# Patient Record
Sex: Female | Born: 1945 | Hispanic: Yes | Smoking: Former smoker
Health system: Southern US, Community
[De-identification: ages and names within clinical notes are randomized; demographics above are authoritative.]

## PROBLEM LIST (undated history)

## (undated) DIAGNOSIS — M1990 Unspecified osteoarthritis, unspecified site: Secondary | ICD-10-CM

## (undated) DIAGNOSIS — R7303 Prediabetes: Secondary | ICD-10-CM

## (undated) DIAGNOSIS — E079 Disorder of thyroid, unspecified: Secondary | ICD-10-CM

## (undated) DIAGNOSIS — R251 Tremor, unspecified: Secondary | ICD-10-CM

## (undated) DIAGNOSIS — Z86718 Personal history of other venous thrombosis and embolism: Secondary | ICD-10-CM

## (undated) DIAGNOSIS — F32A Depression, unspecified: Secondary | ICD-10-CM

## (undated) DIAGNOSIS — K219 Gastro-esophageal reflux disease without esophagitis: Secondary | ICD-10-CM

## (undated) DIAGNOSIS — I10 Essential (primary) hypertension: Secondary | ICD-10-CM

## (undated) DIAGNOSIS — Z923 Personal history of irradiation: Secondary | ICD-10-CM

## (undated) DIAGNOSIS — G4730 Sleep apnea, unspecified: Secondary | ICD-10-CM

## (undated) DIAGNOSIS — F329 Major depressive disorder, single episode, unspecified: Secondary | ICD-10-CM

## (undated) DIAGNOSIS — S329XXA Fracture of unspecified parts of lumbosacral spine and pelvis, initial encounter for closed fracture: Secondary | ICD-10-CM

## (undated) DIAGNOSIS — K5790 Diverticulosis of intestine, part unspecified, without perforation or abscess without bleeding: Secondary | ICD-10-CM

## (undated) DIAGNOSIS — E785 Hyperlipidemia, unspecified: Secondary | ICD-10-CM

## (undated) DIAGNOSIS — F419 Anxiety disorder, unspecified: Secondary | ICD-10-CM

## (undated) DIAGNOSIS — C801 Malignant (primary) neoplasm, unspecified: Secondary | ICD-10-CM

## (undated) HISTORY — PX: TUBAL LIGATION: SHX77

## (undated) HISTORY — DX: Fracture of unspecified parts of lumbosacral spine and pelvis, initial encounter for closed fracture: S32.9XXA

## (undated) HISTORY — DX: Hyperlipidemia, unspecified: E78.5

## (undated) HISTORY — DX: Personal history of other venous thrombosis and embolism: Z86.718

## (undated) HISTORY — DX: Disorder of thyroid, unspecified: E07.9

## (undated) HISTORY — PX: APPENDECTOMY: SHX54

## (undated) HISTORY — DX: Essential (primary) hypertension: I10

## (undated) HISTORY — PX: TONSILLECTOMY: SUR1361

## (undated) HISTORY — PX: OOPHORECTOMY: SHX86

## (undated) HISTORY — PX: HERNIA REPAIR: SHX51

---

## 2004-09-01 ENCOUNTER — Ambulatory Visit: Payer: Self-pay | Admitting: Family Medicine

## 2005-10-04 ENCOUNTER — Ambulatory Visit: Payer: Self-pay | Admitting: Chiropractic Medicine

## 2006-01-13 ENCOUNTER — Ambulatory Visit: Payer: Self-pay | Admitting: Family Medicine

## 2007-02-20 ENCOUNTER — Ambulatory Visit: Payer: Self-pay | Admitting: Family Medicine

## 2007-06-01 ENCOUNTER — Ambulatory Visit: Payer: Self-pay | Admitting: Family Medicine

## 2008-08-26 ENCOUNTER — Ambulatory Visit: Payer: Self-pay | Admitting: Family Medicine

## 2008-10-18 DIAGNOSIS — Z86718 Personal history of other venous thrombosis and embolism: Secondary | ICD-10-CM

## 2008-10-18 HISTORY — DX: Personal history of other venous thrombosis and embolism: Z86.718

## 2009-10-30 ENCOUNTER — Ambulatory Visit: Payer: Self-pay | Admitting: Family Medicine

## 2010-01-26 ENCOUNTER — Ambulatory Visit: Payer: Self-pay | Admitting: Family Medicine

## 2010-12-22 ENCOUNTER — Ambulatory Visit: Payer: Self-pay | Admitting: Family Medicine

## 2011-01-04 ENCOUNTER — Ambulatory Visit: Payer: Self-pay | Admitting: Gastroenterology

## 2012-03-08 ENCOUNTER — Ambulatory Visit: Payer: Self-pay | Admitting: Family Medicine

## 2013-02-14 ENCOUNTER — Ambulatory Visit: Payer: Self-pay | Admitting: Nurse Practitioner

## 2013-03-05 ENCOUNTER — Ambulatory Visit: Payer: Self-pay | Admitting: Family Medicine

## 2013-03-27 ENCOUNTER — Encounter: Payer: Self-pay | Admitting: Pulmonary Disease

## 2013-03-27 ENCOUNTER — Ambulatory Visit (INDEPENDENT_AMBULATORY_CARE_PROVIDER_SITE_OTHER): Payer: Medicare Other | Admitting: Pulmonary Disease

## 2013-03-27 VITALS — BP 120/76 | HR 69 | Temp 98.4°F | Ht 62.0 in | Wt 162.0 lb

## 2013-03-27 DIAGNOSIS — R05 Cough: Secondary | ICD-10-CM

## 2013-03-27 DIAGNOSIS — R9389 Abnormal findings on diagnostic imaging of other specified body structures: Secondary | ICD-10-CM | POA: Insufficient documentation

## 2013-03-27 NOTE — Assessment & Plan Note (Signed)
The chest CT performed in May 2014 showed tree in bud abnormalities in the lateral segment of the right lower lobe. The differential diagnosis includes bacterial pneumonia, aspiration, granulomatous processes such as sarcoid, or less likely an atypical mycobacterial disease. I think the likely etiology is aspiration given the fact that she has had recurrent problems with swallowing and occasionally choking in the last several months. She has been treated with antibiotics and hopefully this will clear up the abnormality.  Plan: -Repeat CT scan in July 2014 -If no improvement and CT scan abnormalities then she will need a bronchoscopy -Consider modified barium swallow if cough/CT scan findings have not improved or if complaints of choking his ongoing.

## 2013-03-27 NOTE — Assessment & Plan Note (Signed)
Mary Meyers's cough has been persistent now for nearly 6 months.  The most likely explanation is recurrent upper respiratory infections and perhaps a mild pneumonia which developed in May as noted on the CT scan (see below).  Usually in situations like this cough causes more cough through and in low cycle of upper airway irritation. She says that her cough has improved which makes me encouraged that there is not a more ominous underlying pulmonary process.  Her simple spirometry test today was completely normal an not suggestive of airflow obstruction (asthma/copd).  Plan: -We will focus heavily on voice rest an active cough suppression -Tussionex used regularly for 3 days to help suppress cough -Use hard candies or warm beverages to help moisten the throat to help prevent cough -After 3 days of regular Tussionex used transitioned to Delsym -Consider barium swallow/swallow evaluation if no improvement

## 2013-03-27 NOTE — Patient Instructions (Signed)
Take the tussionex 5mL every 12 hours as needed for the cough  I want you to have three days of voice rest where you try your best to stop coughing.  Avoid talking during this time.  During this time take the tussionex regularly to help suppress the cough.  Use hard candies like jolly ranchers or lemon drops and hot tea to help keep your throat moist.   After three days you can start using Delsym as needed to help suppress the cough and gradually start using your voice again.   We will repeat the CT scan of your chest in July at [facility] and then will see you sometime after the test.

## 2013-03-27 NOTE — Progress Notes (Signed)
Subjective:    Patient ID: Mary Meyers, female    DOB: 12/30/1945, 67 y.o.   MRN: [id number]  HPI  This is a very pleasant 67 year old female originally from [place] who comes her clinic today to establish care for recurrent cough. She stated that as a child and throughout adulthood she's never had respiratory problems. However in November she developed an episode of bronchitis when she was in [place]. (She lives in [place] half the year). She was treated there with antibiotics and then came back up to [place]. She's had recurrent bronchitis again through March. She describes a heavy cough which is sometimes productive of mucus in the mornings and at night. This is often associated with headaches and dizziness. In the last several weeks she has seen some improvement and she is no longer producing mucus. She notes that she still feels that she has chest congestion. Lately however, the cough has been dry and hacking. It gets worse with coughing and talking. She feels a tickling sensation in her throat. She states that sometimes the cough has been so bad that she starts to feel lightheaded. In fact one night she got up in the middle the night because she was coughing frequently and she believes she may have blacked out because she fell and hit her head. She was actually evaluated in the emergency department for this where she had a head CT and a chest CT. The chest CT was negative for pulmonary embolism.   She has minimal sinus congestion or drainage. She has no acid reflux.   She believes she's been treated with antibiotics 2 or 3 times in the last several months. Most recently, her CT chest showed tree in bud abnormalities in the right lower lobe and she was given a course of Levaquin afterwards. After this she started seeing some improvement in her cough. She feels that she has coughed nearly continuously since November 2013.   She smoked briefly as a young adult prior to her first pregnancy but  has not smoked for the majority of adulthood. Her husband smokes in an isolated part of the house to which she very infrequently goes. He does not smoke around her and does not smoke in the car with her. She lives in a house that was dull just one year ago. It has no basement and no mold or mildew issues. She is frequently exposed to her grandchildren who frequently have upper respiratory symptoms but they have not had anything lately.   Past Medical History  Diagnosis Date  . Hyperlipidemia   . Hypertension   . History of blood clots      History reviewed. No pertinent family history.   History   Social History  . Marital Status: Married    Spouse Name: N/A    Number of Children: N/A  . Years of Education: N/A   Occupational History  . Not on file.   Social History Main Topics  . Smoking status: Former Smoker    Types: Cigarettes    Quit date: 10/19/1971  . Smokeless tobacco: Never Used  . Alcohol Use: No  . Drug Use: No  . Sexually Active: Not on file   Other Topics Concern  . Not on file   Social History Narrative  . No narrative on file     Allergies  Allergen Reactions  . Ivp Dye (Iodinated Diagnostic Agents)      No outpatient prescriptions prior to visit.   No facility-administered medications prior to visit.  Review of Systems  Constitutional: Negative for fever, chills, diaphoresis, appetite change and fatigue.  HENT: Negative for hearing loss, nosebleeds, congestion, sore throat, rhinorrhea, trouble swallowing, neck stiffness, postnasal drip and sinus pressure.   Eyes: Negative for discharge, redness and visual disturbance.  Respiratory: Positive for cough and shortness of breath. Negative for choking, chest tightness and wheezing.   Cardiovascular: Negative for chest pain and leg swelling.  Gastrointestinal: Negative for nausea, abdominal pain, diarrhea, constipation and blood in stool.  Genitourinary: Negative for dysuria, frequency and  hematuria.  Musculoskeletal: Negative for myalgias, joint swelling and arthralgias.  Skin: Negative for color change, pallor and rash.  Neurological: Negative for dizziness, seizures, facial asymmetry, speech difficulty, light-headedness, numbness and headaches.  Hematological: Negative for adenopathy. Does not bruise/bleed easily.       Objective:   Physical Exam  Filed Vitals:   03/27/13 1628  BP: 120/76  Pulse: 69  Temp: 98.4 F (36.9 C)  TempSrc: Oral  Height: 5\' 2"  (1.575 m)  Weight: 162 lb (73.483 kg)  SpO2: 96%  RA  Gen: well appearing, no acute distress, frequent cought HEENT: NCAT, PERRL, EOMi, OP clear, neck supple without masses PULM: CTA B CV: RRR, no mgr, no JVD AB: BS+, soft, nontender, no hsm Ext: warm, no edema, no clubbing, no cyanosis Derm: no rash or skin breakdown Neuro: A&Ox4, CN II-XII intact, strength 5/5 in all 4 extremities      Assessment & Plan:   Cough Caree's cough has been persistent now for nearly 6 months.  The most likely explanation is recurrent upper respiratory infections and perhaps a mild pneumonia which developed in May as noted on the CT scan (see below).  Usually in situations like this cough causes more cough through and in low cycle of upper airway irritation. She says that her cough has improved which makes me encouraged that there is not a more ominous underlying pulmonary process.  Her simple spirometry test today was completely normal an not suggestive of airflow obstruction (asthma/copd).  Plan: -We will focus heavily on voice rest an active cough suppression -Tussionex used regularly for 3 days to help suppress cough -Use hard candies or warm beverages to help moisten the throat to help prevent cough -After 3 days of regular Tussionex used transitioned to Delsym -Consider barium swallow/swallow evaluation if no improvement  Abnormal chest CT The chest CT performed in May 2014 showed tree in bud abnormalities in the  lateral segment of the right lower lobe. The differential diagnosis includes bacterial pneumonia, aspiration, granulomatous processes such as sarcoid, or less likely an atypical mycobacterial disease. I think the likely etiology is aspiration given the fact that she has had recurrent problems with swallowing and occasionally choking in the last several months. She has been treated with antibiotics and hopefully this will clear up the abnormality.  Plan: -Repeat CT scan in July 2014 -If no improvement and CT scan abnormalities then she will need a bronchoscopy -Consider modified barium swallow if cough/CT scan findings have not improved or if complaints of choking his ongoing.   Updated Medication List Outpatient Encounter Prescriptions as of 03/27/2013  Medication Sig Dispense Refill  . celecoxib (CELEBREX) 200 MG capsule Take 200 mg by mouth daily.      . cetirizine (ZYRTEC) 10 MG tablet Take 10 mg by mouth daily as needed for allergies.      Marland Kitchen dexlansoprazole (DEXILANT) 60 MG capsule Take 60 mg by mouth daily.      Marland Kitchen levothyroxine (SYNTHROID,  LEVOTHROID) 100 MCG tablet Take 100 mcg by mouth daily before breakfast.      . meclizine (ANTIVERT) 25 MG tablet Take 25 mg by mouth 3 (three) times daily as needed.      . simvastatin (ZOCOR) 20 MG tablet Take 20 mg by mouth every evening.      . triamterene-hydrochlorothiazide (MAXZIDE) 75-50 MG per tablet Take 0.5 tablets by mouth daily.      Marland Kitchen venlafaxine XR (EFFEXOR-XR) 150 MG 24 hr capsule Take 150 mg by mouth daily.       No facility-administered encounter medications on file as of 03/27/2013.

## 2013-04-23 ENCOUNTER — Telehealth: Payer: Self-pay | Admitting: Pulmonary Disease

## 2013-04-23 NOTE — Telephone Encounter (Signed)
Spoke with pt and she is aware that ct is rescheduled for 04-26-13 at 11:30 at [facility].  Pt verbalized understanding.

## 2013-04-26 ENCOUNTER — Ambulatory Visit: Payer: Self-pay | Admitting: Pulmonary Disease

## 2013-04-30 ENCOUNTER — Telehealth: Payer: Self-pay | Admitting: Pulmonary Disease

## 2013-04-30 DIAGNOSIS — R9389 Abnormal findings on diagnostic imaging of other specified body structures: Secondary | ICD-10-CM

## 2013-04-30 NOTE — Telephone Encounter (Signed)
I called Mary Meyers to tell her that her CT Chest looked great (nearly complete resolution of previous findings, only mild atelectasis).  However, I had to leave a message.  Will cc triage in case she calls back.

## 2013-05-29 ENCOUNTER — Encounter: Payer: Self-pay | Admitting: Pulmonary Disease

## 2014-12-02 ENCOUNTER — Ambulatory Visit: Payer: Self-pay | Admitting: Adult Health

## 2015-09-22 DIAGNOSIS — E039 Hypothyroidism, unspecified: Secondary | ICD-10-CM | POA: Insufficient documentation

## 2015-09-22 DIAGNOSIS — E785 Hyperlipidemia, unspecified: Secondary | ICD-10-CM | POA: Insufficient documentation

## 2016-05-17 DIAGNOSIS — M1990 Unspecified osteoarthritis, unspecified site: Secondary | ICD-10-CM | POA: Insufficient documentation

## 2016-05-17 DIAGNOSIS — F32A Depression, unspecified: Secondary | ICD-10-CM | POA: Insufficient documentation

## 2016-05-17 DIAGNOSIS — K219 Gastro-esophageal reflux disease without esophagitis: Secondary | ICD-10-CM | POA: Insufficient documentation

## 2016-10-29 DIAGNOSIS — R251 Tremor, unspecified: Secondary | ICD-10-CM | POA: Insufficient documentation

## 2017-02-07 DIAGNOSIS — M1710 Unilateral primary osteoarthritis, unspecified knee: Secondary | ICD-10-CM | POA: Insufficient documentation

## 2017-02-07 DIAGNOSIS — M179 Osteoarthritis of knee, unspecified: Secondary | ICD-10-CM | POA: Insufficient documentation

## 2017-02-17 ENCOUNTER — Other Ambulatory Visit: Payer: Self-pay | Admitting: Student

## 2017-02-17 DIAGNOSIS — N950 Postmenopausal bleeding: Secondary | ICD-10-CM

## 2017-02-21 ENCOUNTER — Ambulatory Visit
Admission: RE | Admit: 2017-02-21 | Discharge: 2017-02-21 | Disposition: A | Discharge status: Home / Self Care | Payer: Medicare Other | Source: Ambulatory Visit | Attending: Student | Admitting: Student

## 2017-02-21 DIAGNOSIS — N950 Postmenopausal bleeding: Secondary | ICD-10-CM | POA: Diagnosis present

## 2017-06-01 ENCOUNTER — Other Ambulatory Visit: Payer: Medicare Other

## 2017-07-26 ENCOUNTER — Other Ambulatory Visit: Payer: Medicare Other

## 2017-08-02 ENCOUNTER — Inpatient Hospital Stay: Admission: RE | Admit: 2017-08-02 | Payer: Medicare Other | Source: Ambulatory Visit

## 2017-08-02 NOTE — H&P (Signed)
Mary Meyers is a 72 y.o. female here for Fx D+C and possible myosure resection . Here for SIS and f/up for PMB . EMBX shows benign endometrial polyp . No cancer , hyperplasia   SIS today with inadequate filling of the endometrial cavity .  Stripe measures 12 mm  Past Medical History:  has a past medical history of Depression, unspecified; Diverticulitis; GERD (gastroesophageal reflux disease); and Hypertension.  Past Surgical History:  has a past surgical history that includes Appendectomy. Family History: family history includes Breast cancer in her cousin; Prostate cancer in her brother and father. Social History:  reports that she has never smoked. She has never used smokeless tobacco. She reports that she does not drink alcohol or use drugs. OB/GYN History:          OB History    Gravida Para Term Preterm AB Living   3 3 3     3    SAB TAB Ectopic Molar Multiple Live Births             3      Allergies: is allergic to cortisone; iodinated contrast- oral and iv dye; and tramadol. Medications:  Current Outpatient Prescriptions:  .  atorvastatin (LIPITOR) 40 MG tablet, TAKE 1 TABLET BY MOUTH EVERY DAY, Disp: 90 tablet, Rfl: 1 .  celecoxib (CELEBREX) 200 MG capsule, TAKE 1 CAPSULE(200 MG) BY MOUTH EVERY DAY, Disp: 90 capsule, Rfl: 0 .  cetirizine (ZYRTEC) 10 MG tablet, TAKE 1 TABLET(10 MG) BY MOUTH EVERY DAY AS NEEDED FOR ALLERGIES, Disp: 90 tablet, Rfl: 0 .  DEXILANT 60 mg DR capsule, TAKE 1 CAPSULE(60 MG) BY MOUTH EVERY DAY, Disp: 90 capsule, Rfl: 0 .  levothyroxine (SYNTHROID, LEVOTHROID) 100 MCG tablet, TAKE 1 TABLET(100 MCG) BY MOUTH EVERY DAY 30 TO 60 MINUTES BEFORE BREAKFAST ON AN EMPTY STOMACH AND WITH A GLASS OF WATER, Disp: 90 tablet, Rfl: 0 .  levothyroxine (SYNTHROID, LEVOTHROID) 75 MCG tablet, TAKE 1 TABLET(75 MCG) BY MOUTH EVERY DAY 30 TO 60 MINUTES BEFORE BREAKFAST ON AN EMPTY STOMACH AND WITH A GLASS OF WATER, Disp: 90 tablet, Rfl: 0 .  meclizine (ANTIVERT) 25 mg  tablet, TAKE 1 TABLET BY MOUTH EVERY 6 HOURS AS NEEDED, Disp: 20 tablet, Rfl: 0 .  pantoprazole (PROTONIX) 40 MG DR tablet, TAKE 1 TABLET(40 MG) BY MOUTH EVERY DAY, Disp: 30 tablet, Rfl: 0 .  triamterene-hydrochlorothiazide (MAXZIDE) 75-50 mg tablet, TAKE 1 TABLET BY MOUTH EVERY DAY, Disp: 90 tablet, Rfl: 0 .  venlafaxine (EFFEXOR-XR) 150 MG XR capsule, TAKE 1 CAPSULE BY MOUTH EVERY DAY, Disp: 30 capsule, Rfl: 0  Review of Systems: General:                      No fatigue or weight loss Eyes:                           No vision changes Ears:                            No hearing difficulty Respiratory:                No cough or shortness of breath Pulmonary:                  No asthma or shortness of breath Cardiovascular:           No chest pain, palpitations, dyspnea on  exertion Gastrointestinal:          No abdominal bloating, chronic diarrhea, constipations, masses, pain or hematochezia Genitourinary:             No hematuria, dysuria, abnormal vaginal discharge, pelvic pain, Menometrorrhagia Lymphatic:                   No swollen lymph nodes Musculoskeletal:         No muscle weakness Neurologic:                  No extremity weakness, syncope, seizure disorder Psychiatric:                  No history of depression, delusions or suicidal/homicidal ideation    Exam:      Vitals:   08/02/17  1448  BP: (!) 159/93  Pulse: 80    Body mass index is 37.49 kg/m.  WDWN  female in NAD   Lungs: CTA  CV : RRR without murmur   Abdomen: soft , no mass, normal active bowel sounds,  non-tender, no rebound tenderness Pelvic: tanner stage 5 ,  External genitalia: vulva /labia no lesions Urethra: no prolapse Vagina: normal physiologic d/c Cervix: no lesions, no cervical motion tenderness   Uterus: normal size shape and contour, non-tender Adnexa: no mass,  non-tender   Saline infusion sonohysterography: betadine prep to the cervix followed by placement of the HSG catheter into  the endometrial canal . Sterile H2O is injected while performing a transvaginal u/s . Findings:inadequate filling , no definitive mass  Impression:   The primary encounter diagnosis was PMB (postmenopausal bleeding). A diagnosis of Increased endometrial stripe thickness was also pertinent to this visit.  Endometrial polyps are most likely the cause   Plan:   Recommend Fx D+C , possible myosure  In OR  Risks of the procedure discussed with  the pt      Caroline Sauger, MD

## 2017-08-08 ENCOUNTER — Ambulatory Visit
Admission: RE | Admit: 2017-08-08 | Payer: Medicare Other | Source: Ambulatory Visit | Admitting: Obstetrics and Gynecology

## 2017-08-08 ENCOUNTER — Encounter: Admission: RE | Payer: Self-pay | Source: Ambulatory Visit

## 2017-08-08 SURGERY — DILATATION AND CURETTAGE /HYSTEROSCOPY
Anesthesia: Choice

## 2017-09-17 DIAGNOSIS — C801 Malignant (primary) neoplasm, unspecified: Secondary | ICD-10-CM

## 2017-09-17 HISTORY — DX: Malignant (primary) neoplasm, unspecified: C80.1

## 2017-09-20 NOTE — H&P (Signed)
Mary Meyers is a 71 y.o. female here for Fx D+C and  H/S , possible myosure  . Here for SIS and f/up for PMB . EMBX shows benign endometrial polyp . No cancer , hyperplasia   SIS today with inadequate filling of the endometrial cavity .  Stripe measures 12 mm  Past Medical History:  has a past medical history of Depression, unspecified; Diverticulitis; GERD (gastroesophageal reflux disease); and Hypertension.  Past Surgical History:  has a past surgical history that includes Appendectomy. Family History: family history includes Breast cancer in her cousin; Prostate cancer in her brother and father. Social History:  reports that she has never smoked. She has never used smokeless tobacco. She reports that she does not drink alcohol or use drugs. OB/GYN History:          OB History    Gravida Para Term Preterm AB Living   3 3 3     3    SAB TAB Ectopic Molar Multiple Live Births             3      Allergies: is allergic to cortisone; iodinated contrast- oral and iv dye; and tramadol. Medications:  Current Outpatient Prescriptions:  .  atorvastatin (LIPITOR) 40 MG tablet, TAKE 1 TABLET BY MOUTH EVERY DAY, Disp: 90 tablet, Rfl: 1 .  celecoxib (CELEBREX) 200 MG capsule, TAKE 1 CAPSULE(200 MG) BY MOUTH EVERY DAY, Disp: 90 capsule, Rfl: 0 .  cetirizine (ZYRTEC) 10 MG tablet, TAKE 1 TABLET(10 MG) BY MOUTH EVERY DAY AS NEEDED FOR ALLERGIES, Disp: 90 tablet, Rfl: 0 .  DEXILANT 60 mg DR capsule, TAKE 1 CAPSULE(60 MG) BY MOUTH EVERY DAY, Disp: 90 capsule, Rfl: 0 .  levothyroxine (SYNTHROID, LEVOTHROID) 100 MCG tablet, TAKE 1 TABLET(100 MCG) BY MOUTH EVERY DAY 30 TO 60 MINUTES BEFORE BREAKFAST ON AN EMPTY STOMACH AND WITH A GLASS OF WATER, Disp: 90 tablet, Rfl: 0 .  levothyroxine (SYNTHROID, LEVOTHROID) 75 MCG tablet, TAKE 1 TABLET(75 MCG) BY MOUTH EVERY DAY 30 TO 60 MINUTES BEFORE BREAKFAST ON AN EMPTY STOMACH AND WITH A GLASS OF WATER, Disp: 90 tablet, Rfl: 0 .  meclizine (ANTIVERT) 25 mg  tablet, TAKE 1 TABLET BY MOUTH EVERY 6 HOURS AS NEEDED, Disp: 20 tablet, Rfl: 0 .  pantoprazole (PROTONIX) 40 MG DR tablet, TAKE 1 TABLET(40 MG) BY MOUTH EVERY DAY, Disp: 30 tablet, Rfl: 0 .  triamterene-hydrochlorothiazide (MAXZIDE) 75-50 mg tablet, TAKE 1 TABLET BY MOUTH EVERY DAY, Disp: 90 tablet, Rfl: 0 .  venlafaxine (EFFEXOR-XR) 150 MG XR capsule, TAKE 1 CAPSULE BY MOUTH EVERY DAY, Disp: 30 capsule, Rfl: 0  Review of Systems: General:                      No fatigue or weight loss Eyes:                           No vision changes Ears:                            No hearing difficulty Respiratory:                No cough or shortness of breath Pulmonary:                  No asthma or shortness of breath Cardiovascular:           No chest pain,  palpitations, dyspnea on exertion Gastrointestinal:          No abdominal bloating, chronic diarrhea, constipations, masses, pain or hematochezia Genitourinary:             No hematuria, dysuria, abnormal vaginal discharge, pelvic pain, Menometrorrhagia Lymphatic:                   No swollen lymph nodes Musculoskeletal:         No muscle weakness Neurologic:                  No extremity weakness, syncope, seizure disorder Psychiatric:                  No history of depression, delusions or suicidal/homicidal ideation    Exam:      Vitals:   09/20/17  BP: (!) 159/93  Pulse: 80    Body mass index is 37.49 kg/m.  WDWN white/ female in NAD   Lungs: CTA  CV : RRR without murmur   Abdomen: soft , no mass, normal active bowel sounds,  non-tender, no rebound tenderness Pelvic: tanner stage 5 ,  External genitalia: vulva /labia no lesions Urethra: no prolapse Vagina: normal physiologic d/c Cervix: no lesions, no cervical motion tenderness   Uterus: normal size shape and contour, non-tender Adnexa: no mass,  non-tender   Saline infusion sonohysterography: betadine prep to the cervix followed by placement of the HSG catheter into  the endometrial canal . Sterile H2O is injected while performing a transvaginal u/s . Findings:inadequate filling , no definitive mass  Impression:   The primary encounter diagnosis was PMB (postmenopausal bleeding). A diagnosis of Increased endometrial stripe thickness was also pertinent to this visit.  Endometrial polyps are most likely the cause   Plan:    Fx D+C , possible myosure  In OR  Risks of the procedure discussed with  the pt    Return if symptoms worsen or fail to improve, for preop.  Caroline Sauger, MD                         Note shared with patient     Department

## 2017-09-26 ENCOUNTER — Inpatient Hospital Stay: Admission: RE | Admit: 2017-09-26 | Payer: Medicare Other | Source: Ambulatory Visit

## 2017-09-28 ENCOUNTER — Other Ambulatory Visit: Payer: Self-pay

## 2017-09-28 ENCOUNTER — Encounter
Admission: RE | Admit: 2017-09-28 | Discharge: 2017-09-28 | Disposition: A | Discharge status: Home / Self Care | Payer: Medicare Other | Source: Ambulatory Visit | Attending: Obstetrics and Gynecology | Admitting: Obstetrics and Gynecology

## 2017-09-28 ENCOUNTER — Encounter: Payer: Self-pay | Admitting: *Deleted

## 2017-09-28 DIAGNOSIS — Z01818 Encounter for other preprocedural examination: Secondary | ICD-10-CM | POA: Diagnosis present

## 2017-09-28 DIAGNOSIS — N950 Postmenopausal bleeding: Secondary | ICD-10-CM | POA: Insufficient documentation

## 2017-09-28 DIAGNOSIS — R9389 Abnormal findings on diagnostic imaging of other specified body structures: Secondary | ICD-10-CM | POA: Insufficient documentation

## 2017-09-28 HISTORY — DX: Gastro-esophageal reflux disease without esophagitis: K21.9

## 2017-09-28 HISTORY — DX: Tremor, unspecified: R25.1

## 2017-09-28 HISTORY — DX: Major depressive disorder, single episode, unspecified: F32.9

## 2017-09-28 HISTORY — DX: Depression, unspecified: F32.A

## 2017-09-28 HISTORY — DX: Unspecified osteoarthritis, unspecified site: M19.90

## 2017-09-28 HISTORY — DX: Diverticulosis of intestine, part unspecified, without perforation or abscess without bleeding: K57.90

## 2017-09-28 HISTORY — DX: Anxiety disorder, unspecified: F41.9

## 2017-09-28 LAB — BASIC METABOLIC PANEL
Anion gap: 12 (ref 5–15)
BUN: 23 mg/dL — ABNORMAL HIGH (ref 6–20)
CHLORIDE: 101 mmol/L (ref 101–111)
CO2: 26 mmol/L (ref 22–32)
CREATININE: 0.75 mg/dL (ref 0.44–1.00)
Calcium: 9.9 mg/dL (ref 8.9–10.3)
GFR calc Af Amer: >60 mL/min (ref >60)
GFR calc non Af Amer: >60 mL/min (ref >60)
GLUCOSE: 98 mg/dL (ref 65–99)
POTASSIUM: 3.4 mmol/L — AB (ref 3.5–5.1)
SODIUM: 139 mmol/L (ref 135–145)

## 2017-09-28 LAB — CBC
HEMATOCRIT: 42.3 % (ref 35.0–47.0)
Hemoglobin: 14.5 g/dL (ref 12.0–16.0)
MCH: 30.7 pg (ref 26.0–34.0)
MCHC: 34.2 g/dL (ref 32.0–36.0)
MCV: 89.9 fL (ref 80.0–100.0)
PLATELETS: 413 10*3/uL (ref 150–440)
RBC: 4.71 MIL/uL (ref 3.80–5.20)
RDW: 14.1 % (ref 11.5–14.5)
WBC: 8.5 10*3/uL (ref 3.6–11.0)

## 2017-09-28 LAB — TYPE AND SCREEN
ABO/RH(D): B POS
Antibody Screen: NEGATIVE

## 2017-09-28 NOTE — Patient Instructions (Signed)
Your procedure is scheduled on: October 03, 2017 (Monday ) Report to Same Day Surgery.(MEDICAL [facility] ) SECOND FLOOR To find out your arrival time please call [phone] between [place] on DECEMBER .14, 2018 (Friday )  Remember: Instructions that are not followed completely may result in serious medical risk, up to and including death, or upon the discretion of your surgeon and anesthesiologist your surgery may need to be rescheduled.     _X__ 1. Do not eat food after midnight the night before your procedure.                 No gum chewing or hard candies. You may drink clear liquids up to 2 hours                 before you are scheduled to arrive for your surgery- DO not drink clear                 liquids within 2 hours of the start of your surgery.                 Clear Liquids include:  water, apple juice without pulp, clear carbohydrate                 drink such as Clearfast of Gartorade, Black Coffee or Tea (Do not add                 anything to coffee or tea).     _X__ 2.  No Alcohol for 24 hours before or after surgery.   _X__ 3.  Do Not Smoke or use e-cigarettes For 24 Hours Prior to Your Surgery.                 Do not use any chewable tobacco products for at least 6 hours prior to                 surgery.  ____  4.  Bring all medications with you on the day of surgery if instructed.   _X__  5.  Notify your doctor if there is any change in your medical condition      (cold, fever, infections).     Do not wear jewelry, make-up, hairpins, clips or nail polish. Do not wear lotions, powders, or perfumes.  Do not shave 48 hours prior to surgery. Men may shave face and neck. Do not bring valuables to the hospital.    [facility] is not responsible for any belongings or valuables.  Contacts, dentures or bridgework may not be worn into surgery. Leave your suitcase in the car. After surgery it may be brought to your room. For patients admitted to the  hospital, discharge time is determined by your treatment team.   Patients discharged the day of surgery will not be allowed to drive home.   Please read over the following fact sheets that you were given:             __X__ Take these medicines the morning of surgery with A SIP OF WATER:    1. ATORVASTATIN  2. LEVOTHYROXINE  3. PANTOPRAZOLE  4. PANTOPRAZOLE AT BEDTIME ON SUNDAY NIGHT , DECEMBER 16  5. VENLAFAXINE  6.  ____ Fleet Enema (as directed)   ____ Use CHG Soap as directed  ____ Use inhalers on the day of surgery  ____ Stop metformin 2 days prior to surgery    ____ Take 1/2 of usual insulin dose the night before  surgery. No insulin the morning          of surgery.   ___X_ Stop Coumadin/Plavix/aspirin on (NO ASPIRIN )  __X__ Stop Anti-inflammatories on (NO ASPIRIN PRODUCTS SUCH AS MOTRIN, ADVIL , ALEVE, IBUPROFEN, BC, GOODY'S, EXCEDRIN ) TYLENOL OK TO TAKE FOR PAIN IF NEEDED   ____ Stop supplements until after surgery.    ____ Bring C-Pap to the hospital.   INCENTIVE SPIROMETRY GIVEN TO PATIENT AND INSTRUCTED TO [place]

## 2017-09-28 NOTE — Pre-Procedure Instructions (Signed)
  Component Name Value Ref Range  Vent Rate (bpm) 58   PR Interval (msec) 140   QRS Interval (msec) 84   QT Interval (msec) 440   QTc (msec) 431   Other Result Information  This result has an attachment that is not available.  Result Narrative  Sinus bradycardia with premature atrial complexes Nonspecific ST abnormality Abnormal ECG No previous ECGs available I reviewed and concur with this report. Electronically signed VO:PFYT MD, KEN [phone]) on 08/17/2017 1:02:48 PM  Status

## 2017-10-02 MED ORDER — CEFOXITIN SODIUM-DEXTROSE 2-2.2 GM-%(50ML) IV SOLR: 2.0000 g | INTRAVENOUS | Status: DC

## 2017-10-03 ENCOUNTER — Encounter: Payer: Self-pay | Admitting: Obstetrics and Gynecology

## 2017-10-03 ENCOUNTER — Other Ambulatory Visit: Payer: Self-pay

## 2017-10-03 ENCOUNTER — Ambulatory Visit: Payer: Medicare Other | Admitting: Certified Registered Nurse Anesthetist

## 2017-10-03 ENCOUNTER — Observation Stay
Admission: RE | Admit: 2017-10-03 | Discharge: 2017-10-04 | Disposition: A | Discharge status: Home / Self Care | Payer: Medicare Other | Source: Ambulatory Visit | Attending: Internal Medicine | Admitting: Internal Medicine

## 2017-10-03 ENCOUNTER — Encounter
Admission: RE | Disposition: A | Discharge status: Home / Self Care | Payer: Self-pay | Source: Ambulatory Visit | Attending: Internal Medicine

## 2017-10-03 DIAGNOSIS — C541 Malignant neoplasm of endometrium: Secondary | ICD-10-CM | POA: Diagnosis not present

## 2017-10-03 DIAGNOSIS — F329 Major depressive disorder, single episode, unspecified: Secondary | ICD-10-CM | POA: Insufficient documentation

## 2017-10-03 DIAGNOSIS — E669 Obesity, unspecified: Secondary | ICD-10-CM | POA: Diagnosis not present

## 2017-10-03 DIAGNOSIS — K219 Gastro-esophageal reflux disease without esophagitis: Secondary | ICD-10-CM | POA: Insufficient documentation

## 2017-10-03 DIAGNOSIS — R001 Bradycardia, unspecified: Secondary | ICD-10-CM | POA: Diagnosis present

## 2017-10-03 DIAGNOSIS — I10 Essential (primary) hypertension: Secondary | ICD-10-CM | POA: Diagnosis not present

## 2017-10-03 DIAGNOSIS — Z87891 Personal history of nicotine dependence: Secondary | ICD-10-CM | POA: Insufficient documentation

## 2017-10-03 DIAGNOSIS — T41205A Adverse effect of unspecified general anesthetics, initial encounter: Secondary | ICD-10-CM | POA: Diagnosis not present

## 2017-10-03 DIAGNOSIS — Z86711 Personal history of pulmonary embolism: Secondary | ICD-10-CM | POA: Diagnosis not present

## 2017-10-03 DIAGNOSIS — E039 Hypothyroidism, unspecified: Secondary | ICD-10-CM | POA: Diagnosis not present

## 2017-10-03 DIAGNOSIS — Z888 Allergy status to other drugs, medicaments and biological substances status: Secondary | ICD-10-CM | POA: Insufficient documentation

## 2017-10-03 DIAGNOSIS — I9581 Postprocedural hypotension: Secondary | ICD-10-CM | POA: Diagnosis not present

## 2017-10-03 DIAGNOSIS — Z79899 Other long term (current) drug therapy: Secondary | ICD-10-CM | POA: Insufficient documentation

## 2017-10-03 DIAGNOSIS — Z6837 Body mass index (BMI) 37.0-37.9, adult: Secondary | ICD-10-CM | POA: Insufficient documentation

## 2017-10-03 DIAGNOSIS — N840 Polyp of corpus uteri: Secondary | ICD-10-CM | POA: Diagnosis not present

## 2017-10-03 DIAGNOSIS — M1990 Unspecified osteoarthritis, unspecified site: Secondary | ICD-10-CM | POA: Diagnosis not present

## 2017-10-03 DIAGNOSIS — N950 Postmenopausal bleeding: Secondary | ICD-10-CM | POA: Diagnosis present

## 2017-10-03 DIAGNOSIS — F419 Anxiety disorder, unspecified: Secondary | ICD-10-CM | POA: Diagnosis not present

## 2017-10-03 DIAGNOSIS — E785 Hyperlipidemia, unspecified: Secondary | ICD-10-CM | POA: Diagnosis not present

## 2017-10-03 DIAGNOSIS — Y92238 Other place in hospital as the place of occurrence of the external cause: Secondary | ICD-10-CM | POA: Insufficient documentation

## 2017-10-03 HISTORY — PX: HYSTEROSCOPY WITH D & C: SHX1775

## 2017-10-03 HISTORY — PX: DILATATION & CURETTAGE/HYSTEROSCOPY WITH MYOSURE: SHX6511

## 2017-10-03 LAB — CBC WITH DIFFERENTIAL/PLATELET
BASOS PCT: 1 %
Basophils Absolute: 0 10*3/uL (ref 0–0.1)
EOS ABS: 0.1 10*3/uL (ref 0–0.7)
Eosinophils Relative: 2 %
HCT: 39.9 % (ref 35.0–47.0)
HEMOGLOBIN: 13.6 g/dL (ref 12.0–16.0)
LYMPHS ABS: 1.5 10*3/uL (ref 1.0–3.6)
Lymphocytes Relative: 19 %
MCH: 31 pg (ref 26.0–34.0)
MCHC: 34.1 g/dL (ref 32.0–36.0)
MCV: 90.7 fL (ref 80.0–100.0)
Monocytes Absolute: 0.4 10*3/uL (ref 0.2–0.9)
Monocytes Relative: 5 %
NEUTROS PCT: 73 %
Neutro Abs: 5.7 10*3/uL (ref 1.4–6.5)
Platelets: 353 10*3/uL (ref 150–440)
RBC: 4.4 MIL/uL (ref 3.80–5.20)
RDW: 13.9 % (ref 11.5–14.5)
WBC: 7.8 10*3/uL (ref 3.6–11.0)

## 2017-10-03 LAB — COMPREHENSIVE METABOLIC PANEL
ALK PHOS: 75 U/L (ref 38–126)
ALT: 26 U/L (ref 14–54)
ANION GAP: 8 (ref 5–15)
AST: 28 U/L (ref 15–41)
Albumin: 3.7 g/dL (ref 3.5–5.0)
BUN: 14 mg/dL (ref 6–20)
CALCIUM: 8.9 mg/dL (ref 8.9–10.3)
CO2: 26 mmol/L (ref 22–32)
Chloride: 102 mmol/L (ref 101–111)
Creatinine, Ser: 0.78 mg/dL (ref 0.44–1.00)
GFR calc non Af Amer: >60 mL/min (ref >60)
Glucose, Bld: 107 mg/dL — ABNORMAL HIGH (ref 65–99)
Potassium: 3.5 mmol/L (ref 3.5–5.1)
SODIUM: 136 mmol/L (ref 135–145)
TOTAL PROTEIN: 6.5 g/dL (ref 6.5–8.1)
Total Bilirubin: 0.7 mg/dL (ref 0.3–1.2)

## 2017-10-03 LAB — TROPONIN I

## 2017-10-03 LAB — TSH: TSH: 1.398 u[IU]/mL (ref 0.350–4.500)

## 2017-10-03 LAB — MAGNESIUM: MAGNESIUM: 1.9 mg/dL (ref 1.7–2.4)

## 2017-10-03 LAB — ABO/RH: ABO/RH(D): B POS

## 2017-10-03 SURGERY — DILATATION AND CURETTAGE /HYSTEROSCOPY
Anesthesia: General | Wound class: Clean Contaminated

## 2017-10-03 MED ORDER — GLYCOPYRROLATE 0.2 MG/ML IJ SOLN
INTRAMUSCULAR | Status: AC
  Filled 2017-10-03: qty 1

## 2017-10-03 MED ORDER — GLYCOPYRROLATE 0.2 MG/ML IJ SOLN
0.4000 mg | Freq: Once | INTRAMUSCULAR | Status: AC
  Administered 2017-10-03: 0.4 mg via INTRAVENOUS

## 2017-10-03 MED ORDER — HYDROCODONE-ACETAMINOPHEN 5-325 MG PO TABS: 1.0000 | ORAL_TABLET | ORAL | Status: DC | PRN

## 2017-10-03 MED ORDER — PROMETHAZINE HCL 25 MG/ML IJ SOLN: 6.2500 mg | INTRAMUSCULAR | Status: DC | PRN

## 2017-10-03 MED ORDER — ONDANSETRON HCL 4 MG/2ML IJ SOLN
INTRAMUSCULAR | Status: DC | PRN
  Administered 2017-10-03: 4 mg via INTRAVENOUS

## 2017-10-03 MED ORDER — SODIUM CHLORIDE 0.9% FLUSH
3.0000 mL | Freq: Two times a day (BID) | INTRAVENOUS | Status: DC
  Administered 2017-10-04: 3 mL via INTRAVENOUS

## 2017-10-03 MED ORDER — ONDANSETRON HCL 4 MG PO TABS: 4.0000 mg | ORAL_TABLET | Freq: Four times a day (QID) | ORAL | Status: DC | PRN

## 2017-10-03 MED ORDER — LEVOTHYROXINE SODIUM 100 MCG PO TABS
100.0000 ug | ORAL_TABLET | Freq: Every day | ORAL | Status: DC
  Administered 2017-10-04: 100 ug via ORAL
  Filled 2017-10-03: qty 1

## 2017-10-03 MED ORDER — CEFOXITIN SODIUM-DEXTROSE 2-2.2 GM-%(50ML) IV SOLR
INTRAVENOUS | Status: AC
  Filled 2017-10-03: qty 50

## 2017-10-03 MED ORDER — ONDANSETRON HCL 4 MG/2ML IJ SOLN
INTRAMUSCULAR | Status: AC
  Filled 2017-10-03: qty 2

## 2017-10-03 MED ORDER — MECLIZINE HCL 25 MG PO TABS
25.0000 mg | ORAL_TABLET | Freq: Three times a day (TID) | ORAL | Status: DC | PRN
  Filled 2017-10-03: qty 1

## 2017-10-03 MED ORDER — DEXTROSE-NACL 5-0.9 % IV SOLN: INTRAVENOUS | Status: DC

## 2017-10-03 MED ORDER — FENTANYL CITRATE (PF) 100 MCG/2ML IJ SOLN
INTRAMUSCULAR | Status: AC
  Filled 2017-10-03: qty 2

## 2017-10-03 MED ORDER — CEFAZOLIN SODIUM-DEXTROSE 2-4 GM/100ML-% IV SOLN
INTRAVENOUS | Status: AC
  Filled 2017-10-03: qty 100

## 2017-10-03 MED ORDER — LACTATED RINGERS IV SOLN
INTRAVENOUS | Status: DC
  Administered 2017-10-03: 16:00:00 via INTRAVENOUS

## 2017-10-03 MED ORDER — EPHEDRINE SULFATE 50 MG/ML IJ SOLN
INTRAMUSCULAR | Status: AC
  Filled 2017-10-03: qty 1

## 2017-10-03 MED ORDER — MEPERIDINE HCL 50 MG/ML IJ SOLN: 6.2500 mg | INTRAMUSCULAR | Status: DC | PRN

## 2017-10-03 MED ORDER — ONDANSETRON HCL 4 MG/2ML IJ SOLN: 4.0000 mg | Freq: Four times a day (QID) | INTRAMUSCULAR | Status: DC | PRN

## 2017-10-03 MED ORDER — FENTANYL CITRATE (PF) 100 MCG/2ML IJ SOLN
INTRAMUSCULAR | Status: DC | PRN
  Administered 2017-10-03 (×4): 25 ug via INTRAVENOUS

## 2017-10-03 MED ORDER — CEFAZOLIN SODIUM-DEXTROSE 2-4 GM/100ML-% IV SOLN
2.0000 g | Freq: Once | INTRAVENOUS | Status: AC
  Administered 2017-10-03: 2 g via INTRAVENOUS

## 2017-10-03 MED ORDER — FENTANYL CITRATE (PF) 100 MCG/2ML IJ SOLN
25.0000 ug | INTRAMUSCULAR | Status: DC | PRN
  Administered 2017-10-03: 25 ug via INTRAVENOUS

## 2017-10-03 MED ORDER — VENLAFAXINE HCL ER 75 MG PO CP24
150.0000 mg | ORAL_CAPSULE | Freq: Every day | ORAL | Status: DC
  Administered 2017-10-04: 150 mg via ORAL
  Filled 2017-10-03: qty 2

## 2017-10-03 MED ORDER — ATROPINE SULFATE 1 MG/10ML IJ SOSY: 0.5000 mg | PREFILLED_SYRINGE | INTRAMUSCULAR | Status: DC | PRN

## 2017-10-03 MED ORDER — PANTOPRAZOLE SODIUM 40 MG PO TBEC
40.0000 mg | DELAYED_RELEASE_TABLET | Freq: Every day | ORAL | Status: DC
  Administered 2017-10-04: 40 mg via ORAL
  Filled 2017-10-03: qty 1

## 2017-10-03 MED ORDER — ACETAMINOPHEN 650 MG RE SUPP
650.0000 mg | Freq: Four times a day (QID) | RECTAL | Status: DC | PRN
  Filled 2017-10-03: qty 1

## 2017-10-03 MED ORDER — ATORVASTATIN CALCIUM 20 MG PO TABS
40.0000 mg | ORAL_TABLET | Freq: Every day | ORAL | Status: DC
  Filled 2017-10-03: qty 4

## 2017-10-03 MED ORDER — TRIAMCINOLONE ACETONIDE 0.5 % EX CREA
1.0000 "application " | TOPICAL_CREAM | Freq: Every day | CUTANEOUS | Status: DC | PRN
  Filled 2017-10-03: qty 15

## 2017-10-03 MED ORDER — LIDOCAINE HCL (CARDIAC) 20 MG/ML IV SOLN
INTRAVENOUS | Status: DC | PRN
  Administered 2017-10-03: 60 mg via INTRAVENOUS

## 2017-10-03 MED ORDER — PROPOFOL 10 MG/ML IV BOLUS
INTRAVENOUS | Status: DC | PRN
  Administered 2017-10-03: 150 mg via INTRAVENOUS

## 2017-10-03 MED ORDER — POLYETHYLENE GLYCOL 3350 17 G PO PACK: 17.0000 g | PACK | Freq: Every day | ORAL | Status: DC | PRN

## 2017-10-03 MED ORDER — LORATADINE 10 MG PO TABS
10.0000 mg | ORAL_TABLET | Freq: Every day | ORAL | Status: DC
  Administered 2017-10-04: 10 mg via ORAL
  Filled 2017-10-03: qty 1

## 2017-10-03 MED ORDER — EPHEDRINE SULFATE 50 MG/ML IJ SOLN: 10.0000 mg | Freq: Once | INTRAMUSCULAR | Status: DC

## 2017-10-03 MED ORDER — ACETAMINOPHEN 325 MG PO TABS
ORAL_TABLET | ORAL | Status: AC
  Administered 2017-10-03: 650 mg via ORAL
  Filled 2017-10-03: qty 2

## 2017-10-03 MED ORDER — LACTATED RINGERS IV SOLN: INTRAVENOUS | Status: DC

## 2017-10-03 MED ORDER — ACETAMINOPHEN 325 MG PO TABS
650.0000 mg | ORAL_TABLET | Freq: Four times a day (QID) | ORAL | Status: DC | PRN
  Administered 2017-10-03: 650 mg via ORAL

## 2017-10-03 MED ORDER — SODIUM CHLORIDE FLUSH 0.9 % IV SOLN
INTRAVENOUS | Status: AC
  Filled 2017-10-03: qty 10

## 2017-10-03 SURGICAL SUPPLY — 20 items
ABLATOR ENDOMETRIAL MYOSURE (ABLATOR) IMPLANT
CANISTER SUC SOCK COL 7IN (MISCELLANEOUS) ×2 IMPLANT
CANISTER SUCT 3000ML PPV (MISCELLANEOUS) ×2 IMPLANT
CATH ROBINSON RED A/P 16FR (CATHETERS) ×2 IMPLANT
DEVICE MYOSURE LITE (MISCELLANEOUS) ×2 IMPLANT
GLOVE BIO SURGEON STRL SZ8 (GLOVE) ×2 IMPLANT
GOWN STRL REUS W/ TWL LRG LVL3 (GOWN DISPOSABLE) ×1 IMPLANT
GOWN STRL REUS W/ TWL XL LVL3 (GOWN DISPOSABLE) ×1 IMPLANT
GOWN STRL REUS W/TWL LRG LVL3 (GOWN DISPOSABLE) ×1
GOWN STRL REUS W/TWL XL LVL3 (GOWN DISPOSABLE) ×1
IV LACTATED RINGERS 1000ML (IV SOLUTION) ×2 IMPLANT
KIT RM TURNOVER CYSTO AR (KITS) ×2 IMPLANT
PACK DNC HYST (MISCELLANEOUS) ×2 IMPLANT
PAD OB MATERNITY 4.3X12.25 (PERSONAL CARE ITEMS) ×2 IMPLANT
PAD PREP 24X41 OB/GYN DISP (PERSONAL CARE ITEMS) ×2 IMPLANT
SOL .9 NS 3000ML IRR  AL (IV SOLUTION) ×1
SOL .9 NS 3000ML IRR UROMATIC (IV SOLUTION) ×1 IMPLANT
TOWEL OR 17X26 4PK STRL BLUE (TOWEL DISPOSABLE) ×2 IMPLANT
TUBING CONNECTING 10 (TUBING) ×2 IMPLANT
TUBING HYSTEROSCOPY DOLPHIN (MISCELLANEOUS) ×2 IMPLANT

## 2017-10-03 NOTE — Anesthesia Post-op Follow-up Note (Signed)
Anesthesia QCDR form completed.        

## 2017-10-03 NOTE — Anesthesia Procedure Notes (Signed)
Procedure Name: LMA Insertion Date/Time: 10/03/2017 4:09 PM Performed by: Hedda Slade, CRNA Pre-anesthesia Checklist: Patient identified, Patient being monitored, Timeout performed, Emergency Drugs available and Suction available Patient Re-evaluated:Patient Re-evaluated prior to induction Oxygen Delivery Method: Circle system utilized Preoxygenation: Pre-oxygenation with 100% oxygen Induction Type: IV induction Ventilation: Mask ventilation without difficulty LMA: LMA inserted LMA Size: 4.0 Tube type: Oral Number of attempts: 2 Placement Confirmation: positive ETCO2 and breath sounds checked- equal and bilateral Tube secured with: Tape Dental Injury: Teeth and Oropharynx as per pre-operative assessment  Comments: Unable to achieve adequate Vt with 3.5 LMA

## 2017-10-03 NOTE — Progress Notes (Signed)
Ready for fractional D+C , npo . All questions answered .

## 2017-10-03 NOTE — Anesthesia Preprocedure Evaluation (Signed)
Anesthesia Evaluation  Patient identified by MRN, date of birth, ID band Patient awake    Reviewed: Allergy & Precautions, NPO status , Patient's Chart, lab work & pertinent test results  History of Anesthesia Complications Negative for: history of anesthetic complications  Airway Mallampati: II  TM Distance: >3 FB Neck ROM: Full    Dental  (+) Upper Dentures, Lower Dentures   Pulmonary neg sleep apnea, neg COPD, former smoker,    breath sounds clear to auscultation- rhonchi (-) wheezing      Cardiovascular Exercise Tolerance: Good hypertension, Pt. on medications (-) CAD, (-) Past MI, (-) Cardiac Stents and (-) CABG  Rhythm:Regular Rate:Normal - Systolic murmurs and - Diastolic murmurs    Neuro/Psych PSYCHIATRIC DISORDERS Anxiety Depression negative neurological ROS     GI/Hepatic Neg liver ROS, GERD  ,  Endo/Other  negative endocrine ROSneg diabetes  Renal/GU negative Renal ROS     Musculoskeletal  (+) Arthritis ,   Abdominal (+) + obese,   Peds  Hematology negative hematology ROS (+)   Anesthesia Other Findings Past Medical History: No date: Anxiety No date: Arthritis No date: Depression No date: Diverticulosis No date: GERD (gastroesophageal reflux disease) 2010: History of blood clots     Comment:  Pulmonary embolism No date: Hyperlipidemia No date: Hypertension No date: Tremor   Reproductive/Obstetrics                             Anesthesia Physical Anesthesia Plan  ASA: II  Anesthesia Plan: General   Post-op Pain Management:    Induction: Intravenous  PONV Risk Score and Plan: 2 and Dexamethasone and Ondansetron  Airway Management Planned: LMA  Additional Equipment:   Intra-op Plan:   Post-operative Plan:   Informed Consent: I have reviewed the patients History and Physical, chart, labs and discussed the procedure including the risks, benefits and  alternatives for the proposed anesthesia with the patient or authorized representative who has indicated his/her understanding and acceptance.   Dental advisory given  Plan Discussed with: CRNA and Anesthesiologist  Anesthesia Plan Comments:         Anesthesia Quick Evaluation

## 2017-10-03 NOTE — Progress Notes (Signed)
Patient ID: Mary Meyers, female   DOB: 03/17/1946, 71 y.o.   MRN: [id number] Post op notes reviewed . From surgical standpoint , uncomplicated surgery . Marland Kitchen No c/o currently  O: VSS currently A: hypotensive episode secondary to bradycardia  P; Hospitalist to manage cardiac care I will see patient in office in 2 weeks

## 2017-10-03 NOTE — Brief Op Note (Signed)
10/03/2017  4:42 PM  PATIENT:  Mary Meyers  71 y.o. female  PRE-OPERATIVE DIAGNOSIS:  Thickened Endometrial Stripe  Endometrial Polyp  POST-OPERATIVE DIAGNOSIS:  Thickened Endometrial Stripe  Endometrial Polyp  PROCEDURE:  Procedure(s): DILATATION AND CURETTAGE /HYSTEROSCOPY (N/A) DILATATION & CURETTAGE/HYSTEROSCOPY WITH MYOSURE (N/A)   Myosure resection of endometrial polyp  SURGEON:  Surgeon(s) and Role:    * Syona Wroblewski, Gwen Her, MD - Primary  PHYSICIAN ASSISTANT: scrub tech Doman ASSISTANTS: none   ANESTHESIA:   general  EBL:  3 mL   BLOOD ADMINISTERED:none  DRAINS: none   LOCAL MEDICATIONS USED:  NONE  SPECIMEN:  Source of Specimen:  ECC , endometrial curettings with endometrial polyp  DISPOSITION OF SPECIMEN:  PATHOLOGY  COUNTS:  YES  TOURNIQUET:  * No tourniquets in log *  DICTATION: .Other Dictation: Dictation Number verbal  PLAN OF CARE: Discharge to home after PACU  PATIENT DISPOSITION:  PACU - hemodynamically stable.   Delay start of Pharmacological VTE agent (>24hrs) due to surgical blood loss or risk of bleeding: not applicable

## 2017-10-03 NOTE — Transfer of Care (Signed)
Immediate Anesthesia Transfer of Care Note  Patient: Mary Meyers  Procedure(s) Performed: DILATATION AND CURETTAGE /HYSTEROSCOPY (N/A ) DILATATION & CURETTAGE/HYSTEROSCOPY WITH MYOSURE (N/A )  Patient Location: PACU  Anesthesia Type:General  Level of Consciousness: awake, alert  and oriented  Airway & Oxygen Therapy: Patient Spontanous Breathing and Patient connected to face mask oxygen  Post-op Assessment: Report given to RN and Post -op Vital signs reviewed and stable  Post vital signs: Reviewed and stable  Last Vitals:  Vitals:   10/03/17 1238 10/03/17 1650  BP: 134/79 (!) 172/86  Pulse: 71 68  Resp: 17 11  Temp: 36.7 C 37.1 C  SpO2: 95% 96%    Last Pain:  Vitals:   10/03/17 1650  TempSrc: Temporal         Complications: No apparent anesthesia complications

## 2017-10-03 NOTE — H&P (Signed)
[facility] [place] NAME: Mary Meyers    MR#:  [id number]  DATE OF BIRTH:  Feb 20, 1946  DATE OF ADMISSION:  10/03/2017  PRIMARY CARE PHYSICIAN: Juluis Pitch, MD   REQUESTING/REFERRING PHYSICIAN:   CHIEF COMPLAINT:  No chief complaint on file.   HISTORY OF PRESENT ILLNESS: Mary Meyers  is a 71 y.o. female with a known history per below, status post DIC with hysteroscopy done electively for postmenopausal bleeding, after the procedure in the PACU patient noted to be severely bradycardic with heart rate in the 30s blood pressure in the 50s, treated with Robinul and IV fluids for rehydration, hospitalist service consulted, patient evaluated in the PACU, patient only complaining of some sore throat as well as pain in the pelvic area, patient denies chest pain or shortness of breath at this time, during the acute bradycardic event patient was diaphoretic, short of breath, patient prior to procedure did have cardiac evaluation with stress testing which was normal per patient, patient is now being admitted with acute symptomatic bradycardia.  PAST MEDICAL HISTORY:   Past Medical History:  Diagnosis Date  . Anxiety   . Arthritis   . Depression   . Diverticulosis   . GERD (gastroesophageal reflux disease)   . History of blood clots 2010   Pulmonary embolism  . Hyperlipidemia   . Hypertension   . Tremor     PAST SURGICAL HISTORY:  Past Surgical History:  Procedure Laterality Date  . APPENDECTOMY    . [place]  . HERNIA REPAIR     Umbilical Hernia  . TONSILLECTOMY    . TUBAL LIGATION      SOCIAL HISTORY:  Social History   Tobacco Use  . Smoking status: Former Smoker    Types: Cigarettes    Last attempt to quit: 10/19/1971    Years since quitting: 45.9  . Smokeless tobacco: Never Used  Substance Use Topics  . Alcohol use: No    FAMILY HISTORY:  Family History  Problem Relation Age of Onset  .  Hypertension Mother   . Hypertension Father   . Prostate cancer Father     DRUG ALLERGIES:  Allergies  Allergen Reactions  . Cortisone Hives and Other (See Comments)    Burning/swelling.  Clementeen Hoof [Iodinated Diagnostic Agents] Other (See Comments)    Burning/swelling.  . Tramadol Nausea And Vomiting and Other (See Comments)    dizziness    REVIEW OF SYSTEMS:   CONSTITUTIONAL: No fever, +fatigue/weakness/diaphoresis.  EYES: No blurred or double vision.  EARS, NOSE, AND THROAT: No tinnitus or ear pain.  RESPIRATORY: No cough, +shortness of breath,no wheezing or hemoptysis.  CARDIOVASCULAR: No chest pain, orthopnea, edema.  GASTROINTESTINAL: No nausea, vomiting, diarrhea or abdominal pain.  GENITOURINARY: No dysuria, hematuria.  ENDOCRINE: No polyuria, nocturia,  HEMATOLOGY: No anemia, easy bruising or bleeding SKIN: No rash or lesion. MUSCULOSKELETAL: No joint pain or arthritis.   NEUROLOGIC: No tingling, numbness, weakness.  PSYCHIATRY: No anxiety or depression.   MEDICATIONS AT HOME:  Prior to Admission medications   Medication Sig Start Date End Date Taking? Authorizing Provider  atorvastatin (LIPITOR) 40 MG tablet Take 40 mg by mouth daily. 07/22/17  Yes [provider]  celecoxib (CELEBREX) 200 MG capsule Take 200 mg by mouth daily.   Yes [provider]  cetirizine (ZYRTEC) 10 MG tablet Take 10 mg by mouth daily.    Yes [provider]  levothyroxine (SYNTHROID,  LEVOTHROID) 100 MCG tablet Take 100 mcg by mouth daily before breakfast. 06/24/17  Yes [provider]  meclizine (ANTIVERT) 25 MG tablet Take 25 mg by mouth 3 (three) times daily as needed (for vertigo).    Yes [provider]  pantoprazole (PROTONIX) 40 MG tablet Take 40 mg by mouth daily before breakfast. 06/27/17  Yes [provider]  triamcinolone cream (KENALOG) 0.5 % Apply 1 application topically daily as needed (applied to dry/rough area of elbows.).   Yes  [provider]  triamterene-hydrochlorothiazide (MAXZIDE) 75-50 MG per tablet Take 1 tablet by mouth daily.    Yes [provider]  venlafaxine XR (EFFEXOR-XR) 150 MG 24 hr capsule Take 150 mg by mouth daily.   Yes [provider]      PHYSICAL EXAMINATION:   VITAL SIGNS: Blood pressure 140/80, pulse 78, temperature 98.7 F (37.1 C), resp. rate 14, height 5\' 2"  (1.575 m), weight 93 kg (205 lb), SpO2 92 %.  GENERAL:  71 y.o.-year-old patient lying in the bed with no acute distress.  Overweight EYES: Pupils equal, round, reactive to light and accommodation. No scleral icterus. Extraocular muscles intact.  HEENT: Head atraumatic, normocephalic. Oropharynx and nasopharynx clear.  NECK:  Supple, no jugular venous distention. No thyroid enlargement, no tenderness.  LUNGS: Normal breath sounds bilaterally, no wheezing, rales,rhonchi or crepitation. No use of accessory muscles of respiration.  CARDIOVASCULAR: S1, S2 normal. No murmurs, rubs, or gallops.  ABDOMEN: Soft, nontender, nondistended. Bowel sounds present. No organomegaly or mass.  EXTREMITIES: No pedal edema, cyanosis, or clubbing.  NEUROLOGIC: Cranial nerves II through XII are intact. MAES. Gait not checked.  PSYCHIATRIC: The patient is alert and oriented x 3.  SKIN: No obvious rash, lesion, or ulcer.   LABORATORY PANEL:   CBC Recent Labs  Lab 09/28/17 1550  WBC 8.5  HGB 14.5  HCT 42.3  PLT 413  MCV 89.9  MCH 30.7  MCHC 34.2  RDW 14.1   ------------------------------------------------------------------------------------------------------------------  Chemistries  Recent Labs  Lab 09/28/17 1550  NA 139  K 3.4*  CL 101  CO2 26  GLUCOSE 98  BUN 23*  CREATININE 0.75  CALCIUM 9.9   ------------------------------------------------------------------------------------------------------------------ estimated creatinine clearance is 69.5 mL/min (by C-G formula based on SCr of 0.75  mg/dL). ------------------------------------------------------------------------------------------------------------------ No results for input(s): TSH, T4TOTAL, T3FREE, THYROIDAB in the last 72 hours.  Invalid input(s): FREET3   Coagulation profile No results for input(s): INR, PROTIME in the last 168 hours. ------------------------------------------------------------------------------------------------------------------- No results for input(s): DDIMER in the last 72 hours. -------------------------------------------------------------------------------------------------------------------  Cardiac Enzymes No results for input(s): CKMB, TROPONINI, MYOGLOBIN in the last 168 hours.  Invalid input(s): CK ------------------------------------------------------------------------------------------------------------------ Invalid input(s): POCBNP  ---------------------------------------------------------------------------------------------------------------  Urinalysis No results found for: COLORURINE, APPEARANCEUR, LABSPEC, PHURINE, GLUCOSEU, HGBUR, BILIRUBINUR, KETONESUR, PROTEINUR, UROBILINOGEN, NITRITE, LEUKOCYTESUR   RADIOLOGY: No results found.  EKG: Orders placed or performed during the hospital encounter of 10/03/17  . EKG 12-Lead  . EKG 12-Lead    IMPRESSION AND PLAN: 1 acute symptomatic bradycardia Exact etiology is unknown Occurring after D&C with hysteroscopy Refer to observation/telemetry unit, rule out acute coronary syndrome with chronic enzymes x3 sets, check TSH, atropine 0.5 mg IV at the bedside as needed every 4 hours as needed symptomatic bradycardia, neurochecks per routine, check CMP/magnesium/CBC, IV fluids for rehydration, supplemental oxygen as needed, check echocardiogram, continue close medical monitoring  2 chronic benign essential hypertension Noted severe hypotension postoperatively Hold antihypertensives for now, vitals per routine, make changes as  per necessary  3 chronic hypothyroidism Continue Synthroid and check TSH  4 history of GERD PPI daily  5 chronic postmenopausal bleeding Status post D&C and hysteroscopy Consult gynecology for continuity of care  Full code Condition stable Prognosis fair DVT prophylaxis with SCDs/TED hose for now Disposition home tomorrow barring any complications  All the records are reviewed and case discussed with ED provider. Management plans discussed with the patient, family and they are in agreement.  CODE STATUS: Code Status History    This patient does not have a recorded code status. Please follow your organizational policy for patients in this situation.    Advance Directive Documentation     Most Recent Value  Type of Advance Directive  Healthcare Power of Attorney, Living will  Pre-existing out of facility DNR order (yellow form or pink MOST form)  No data  "MOST" Form in Place?  No data       TOTAL TIME TAKING CARE OF THIS PATIENT: 45 minutes.    Avel Peace Salary M.D on 10/03/2017   Between 7am to 6pm - Pager - [phone]  After 6pm go to [email or web address] - password [email or web address] [facility]  Office  [phone]  CC: Primary care physician; Juluis Pitch, MD   Note: This dictation was prepared with Dragon dictation along with smaller phrase technology. Any transcriptional errors that result from this process are unintentional.

## 2017-10-04 DIAGNOSIS — C541 Malignant neoplasm of endometrium: Secondary | ICD-10-CM | POA: Diagnosis not present

## 2017-10-04 LAB — TROPONIN I: Troponin I: <0.03 ng/mL (ref <0.03)

## 2017-10-04 MED ORDER — ALUM & MAG HYDROXIDE-SIMETH 200-200-20 MG/5ML PO SUSP
15.0000 mL | Freq: Once | ORAL | Status: AC
  Administered 2017-10-04: 15 mL via ORAL
  Filled 2017-10-04: qty 30

## 2017-10-04 MED ORDER — PHENOL 1.4 % MT LIQD
1.0000 | OROMUCOSAL | Status: DC | PRN
Start: 2017-10-04 — End: 2017-10-04
  Administered 2017-10-04: 1 via OROMUCOSAL
  Filled 2017-10-04: qty 177

## 2017-10-04 NOTE — Progress Notes (Signed)
IV and tele removed from patient. Discharge instructions given to patient along. Verbalized understanding. No distress at this time. Husband at bedside and will transport patient home.

## 2017-10-04 NOTE — Care Management Obs Status (Signed)
[facility] NOTIFICATION   Patient Details  Name: Mary Meyers MRN: [id number] Date of Birth: June 13, 1946   Medicare Observation Status Notification Given:  Yes    Shelbie Ammons, RN 10/04/2017, 8:48 AM

## 2017-10-04 NOTE — Care Management (Signed)
Admitted to to [facility] following a D&C hysteroscopy 10/03/17 under observation status. Developed bradycardia during recovery period.  Lives with husband, Hennie Duos. Dr. Lovie Macadamia is listed as primary care physician Discharge to home per Dr. Ouida Sills Family will transport Mary Ammons RN MSN [facility] [phone]

## 2017-10-04 NOTE — Op Note (Signed)
Mary Meyers, Mary Meyers NO.:  [id number]  MEDICAL RECORD NO.:  [id number]  LOCATION:                                 FACILITY:  PHYSICIAN:  Laverta Baltimore, MD     DATE OF BIRTH:  DATE OF PROCEDURE: DATE OF DISCHARGE:                              OPERATIVE REPORT   PREOPERATIVE DIAGNOSIS: 1. Postmenopausal bleeding. 2. Thickened endometrial stripe.  POSTOPERATIVE DIAGNOSIS: 1. Postmenopausal bleeding. 2. Endometrial polyp.  PROCEDURE PERFORMED: 1. Fractional dilation and curettage. 2. MyoSure resection of endometrial polyp.  SURGEON:  Laverta Baltimore, MD  ANESTHESIA:  General endotracheal anesthesia.  FIRST ASSISTANT:  Scrub tech, Doman.  INDICATION:  A 71 year old female, gravida 3, para 3 patient with a history of postmenopausal bleeding.  Endometrial biopsy in the office showed benign endometrial polyp.  Endometrial stripe measures 12 mm with inadequate saline infusion and sonohysterography in the office.  DESCRIPTION OF PROCEDURE:  After adequate general endotracheal anesthesia, the patient was placed in dorsal supine position.  Legs were placed in the candy-cane stirrups.  The patient was prepped and draped in a normal sterile fashion.  She did receive 2 g of IV Ancef prior to commencement of the case.  A time-out was performed.  Straight catheterization of the bladder yielded 15 mL of clear urine.  A weighted speculum was placed in the posterior vaginal vault.  The anterior cervix was grasped with a single-tooth tenaculum.  An endocervical curettage was performed with scant tissue.  The uterus was then sounded to 7.5 cm and the cervix was dilated to #18 Hanks dilator.  Hysteroscope was advanced into the endometrial cavity without difficulty.  Normal saline was used as distending medium.  A large anterior polyp was noted.  Polyp was multilobed.  MyoSure was brought up to the operative field and advanced into the endometrial  cavity.  MyoSure resection then took place with good removal of the polyp.  The right cornua demonstrated secondary fluffy polyp, which was partially removed and given the proximity to the cornua, the monitor could not be advanced further to remove this area in question in toto.  Good hemostasis was noted.  The hysteroscope MyoSure instrument was removed. An endometrial curettage was then performed with scant tissue.  Good hemostasis was noted.  Single-tooth tenaculum was removed.  There were no complications.  ESTIMATED BLOOD LOSS:  Minimal.  INTRAOPERATIVE FLUIDS:  800 mL.  DEFICIT:  Normal saline 590 mL, which most of was in the wound towels draping the patient.  Estimated deficit less than 100 mL.  The patient tolerated the procedure well and was taken to recovery room in good condition.    ______________________________ Laverta Baltimore, MD   ______________________________ Laverta Baltimore, MD    TS/MEDQ  D:  10/03/2017  T:  10/04/2017  Job:  [id number]

## 2017-10-04 NOTE — Anesthesia Postprocedure Evaluation (Signed)
Anesthesia Post Note  Patient: Mary Meyers  Procedure(s) Performed: DILATATION AND CURETTAGE /HYSTEROSCOPY (N/A ) DILATATION & CURETTAGE/HYSTEROSCOPY WITH MYOSURE (N/A )  Patient location during evaluation: PACU Anesthesia Type: General Level of consciousness: awake and alert and oriented Pain management: pain level controlled Vital Signs Assessment: post-procedure vital signs reviewed and stable Respiratory status: spontaneous breathing, nonlabored ventilation and respiratory function stable Cardiovascular status: blood pressure returned to baseline and stable Postop Assessment: no signs of nausea or vomiting Anesthetic complications: no     Last Vitals:  Vitals:   10/03/17 1958 10/04/17 0410  BP:  (!) 116/58  Pulse:  (!) 57  Resp:  18  Temp: 36.8 C 36.6 C  SpO2:  91%    Last Pain:  Vitals:   10/04/17 0410  TempSrc: Oral  PainSc:                  Mary Meyers

## 2017-10-04 NOTE — Discharge Instructions (Signed)
Resume diet as before. ° ° °

## 2017-10-06 LAB — SURGICAL PATHOLOGY

## 2017-10-07 NOTE — Discharge Summary (Signed)
[name] [place] NAME: Mary Meyers    MR#:  [id number]  DATE OF BIRTH:  1945-12-09  DATE OF ADMISSION:  10/03/2017 ADMITTING PHYSICIAN: Boykin Nearing, MD  DATE OF DISCHARGE: 10/04/2017 11:24 AM  PRIMARY CARE PHYSICIAN: Juluis Pitch, MD   ADMISSION DIAGNOSIS:  Thickened Endometrial Stripe  Endometrial Polyp  DISCHARGE DIAGNOSIS:  Active Problems:   Bradycardia   SECONDARY DIAGNOSIS:   Past Medical History:  Diagnosis Date  . Anxiety   . Arthritis   . Depression   . Diverticulosis   . GERD (gastroesophageal reflux disease)   . History of blood clots 2010   Pulmonary embolism  . Hyperlipidemia   . Hypertension   . Tremor      ADMITTING HISTORY  HISTORY OF PRESENT ILLNESS: Mary Meyers  is a 71 y.o. female with a known history per below, status post DIC with hysteroscopy done electively for postmenopausal bleeding, after the procedure in the PACU patient noted to be severely bradycardic with heart rate in the 30s blood pressure in the 50s, treated with Robinul and IV fluids for rehydration, hospitalist service consulted, patient evaluated in the PACU, patient only complaining of some sore throat as well as pain in the pelvic area, patient denies chest pain or shortness of breath at this time, during the acute bradycardic event patient was diaphoretic, short of breath, patient prior to procedure did have cardiac evaluation with stress testing which was normal per patient, patient is now being admitted with acute symptomatic bradycardia.    HOSPITAL COURSE:   *Sinus bradycardia secondary to anesthesia and pain.  Patient was monitored overnight and did not have any further bradycardic or hypotensive episodes.  Patient was discharged home to follow-up with gynecology after surgery.  CONSULTS OBTAINED:  Treatment Team:  Gorden Harms, MD  DRUG ALLERGIES:   Allergies  Allergen Reactions  . Cortisone Hives  and Other (See Comments)    Burning/swelling.  Clementeen Hoof [Iodinated Diagnostic Agents] Other (See Comments)    Burning/swelling.  . Tramadol Nausea And Vomiting and Other (See Comments)    dizziness    DISCHARGE MEDICATIONS:   Allergies as of 10/04/2017      Reactions   Cortisone Hives, Other (See Comments)   Burning/swelling.   Ivp Dye [iodinated Diagnostic Agents] Other (See Comments)   Burning/swelling.   Tramadol Nausea And Vomiting, Other (See Comments)   dizziness      Medication List    TAKE these medications   atorvastatin 40 MG tablet Commonly known as:  LIPITOR Take 40 mg by mouth daily.   celecoxib 200 MG capsule Commonly known as:  CELEBREX Take 200 mg by mouth daily.   cetirizine 10 MG tablet Commonly known as:  ZYRTEC Take 10 mg by mouth daily.   levothyroxine 100 MCG tablet Commonly known as:  SYNTHROID, LEVOTHROID Take 100 mcg by mouth daily before breakfast.   meclizine 25 MG tablet Commonly known as:  ANTIVERT Take 25 mg by mouth 3 (three) times daily as needed (for vertigo).   pantoprazole 40 MG tablet Commonly known as:  PROTONIX Take 40 mg by mouth daily before breakfast.   triamcinolone cream 0.5 % Commonly known as:  KENALOG Apply 1 application topically daily as needed (applied to dry/rough area of elbows.).   triamterene-hydrochlorothiazide 75-50 MG tablet Commonly known as:  MAXZIDE Take 1 tablet by mouth daily.   venlafaxine XR 150 MG 24 hr capsule Commonly known as:  EFFEXOR-XR Take 150 mg by mouth daily.       Today   VITAL SIGNS:  Blood pressure 119/66, pulse (!) 58, temperature 97.8 F (36.6 C), temperature source Oral, resp. rate 18, height 5\' 2"  (1.575 m), weight 93 kg (205 lb), SpO2 93 %.  I/O:  No intake or output data in the 24 hours ending 10/07/17 1504  PHYSICAL EXAMINATION:  Physical Exam  GENERAL:  71 y.o.-year-old patient lying in the bed with no acute distress.  LUNGS: Normal breath sounds  bilaterally, no wheezing, rales,rhonchi or crepitation. No use of accessory muscles of respiration.  CARDIOVASCULAR: S1, S2 normal. No murmurs, rubs, or gallops.  ABDOMEN: Soft, non-tender, non-distended. Bowel sounds present. No organomegaly or mass.  NEUROLOGIC: Moves all 4 extremities. PSYCHIATRIC: The patient is alert and oriented x 3.  SKIN: No obvious rash, lesion, or ulcer.   DATA REVIEW:   CBC Recent Labs  Lab 10/03/17 1958  WBC 7.8  HGB 13.6  HCT 39.9  PLT 353    Chemistries  Recent Labs  Lab 10/03/17 1958  NA 136  K 3.5  CL 102  CO2 26  GLUCOSE 107*  BUN 14  CREATININE 0.78  CALCIUM 8.9  MG 1.9  AST 28  ALT 26  ALKPHOS 75  BILITOT 0.7    Cardiac Enzymes Recent Labs  Lab 10/04/17 0748  TROPONINI <0.03    Microbiology Results  No results found for this or any previous visit.  RADIOLOGY:  No results found.  Follow up with PCP in 1 week.  Management plans discussed with the patient, family and they are in agreement.  CODE STATUS:  Code Status History    Date Active Date Inactive Code Status Order ID Comments User Context   10/03/2017 19:48 10/04/2017 14:29 Full Code [id number]  SalaryAvel Peace, MD Inpatient    Advance Directive Documentation     Most Recent Value  Type of Advance Directive  Healthcare Power of Attorney, Living will  Pre-existing out of facility DNR order (yellow form or pink MOST form)  No data  "MOST" Form in Place?  No data      TOTAL TIME TAKING CARE OF THIS PATIENT ON DAY OF DISCHARGE: more than 30 minutes.   Mary Meyers M.D on 10/07/2017 at 3:04 PM  Between 7am to 6pm - Pager - [phone]  After 6pm go to [email or web address] - password [email or web address] [facility]  Office  [phone]  CC: Primary care physician; Juluis Pitch, MD  Note: This dictation was prepared with Dragon dictation along with smaller phrase technology. Any transcriptional errors that result from this process are  unintentional.

## 2017-10-14 ENCOUNTER — Other Ambulatory Visit: Payer: Self-pay

## 2017-10-14 NOTE — Progress Notes (Signed)
mdt  

## 2017-10-17 ENCOUNTER — Other Ambulatory Visit: Payer: Self-pay

## 2017-10-18 DIAGNOSIS — Z923 Personal history of irradiation: Secondary | ICD-10-CM

## 2017-10-18 HISTORY — PX: ABDOMINAL HYSTERECTOMY: SHX81

## 2017-10-18 HISTORY — DX: Personal history of irradiation: Z92.3

## 2017-10-18 NOTE — Progress Notes (Signed)
Gynecologic Oncology Consult Visit   Referring Provider: Laverta Baltimore, MD  Chief Concern: endometrial cancer  Subjective:  Mary Meyers is a 72 y.o. G23P3 female who is seen in consultation from Dr. Ouida Sills for high-grade endometrial cancer.   She presented for postmenopausal bleeding and evaluation included the following:   02/21/2017 Pelvic US at [facility] Uterus: Measurements: 5.8 x 3.2 x 3.4 cm. No fibroids or other mass visualized. Endometrium: Thickness: 1.0 cm.  Bilateral ovaries: Not visualized.  IMPRESSION: 1. Diffusely heterogeneous appearance to the endometrial echo complex, with scattered tiny cystic foci. OB/GYN consultation is recommended for further evaluation to help exclude malignancy, given the patient's vaginal bleeding. 2. Ovaries not visualized on this study.  04/29/2017 She was seen by Dr. Ouida Sills. EMBX performed - uterus sounded to 6 cm.  Based on Dr. Tonette Bihari note her [facility] shows benign endometrial polyp - no cancer, hyperplasia   03/24/2017 saline pelvic US thickened endometrial stripe 12 mm; uterus and ovaries normal.    09/20/2017 Saline infusion sonohysterography:betadine prep to the cervix followed by placement of the HSG catheter into the endometrial canal . Sterile H2O is injected while performing a transvaginal u/s .Findings:inadequate filling , no definitive mass  10/04/2017 OR for Fractional dilation and curettage and MyoSure resection of endometrial polyp  Postoperatively she was significantly bradycardia with a pulse in the 30's and BP in the 50's. The hospitalist was consulted and she was felt to have sinus bradycardia secondary to anesthesia and pain.   Of note she was seen by Dr. Clayborn Bigness, Cardiology, before surgery with the following tests:  . NM myocardial perfusion SPECT multiple (stress and rest) 09/12/2017 LVEF= 64 %  FINDINGS: Regional wall motion:reveals normal myocardial thickening and wall  motion. The  overall quality of the study is good. Artifacts noted: no Left ventricular cavity: normal.  Perfusion Analysis:SPECT images demonstrate homogeneous tracer  distribution throughout the myocardium.  . ECG stress test only 08/10/2017 Sinus bradycardia with premature atrial complexes Nonspecific ST abnormality Abnormal ECG  . Echo complete 09/12/2017 INTERPRETATION NORMAL LEFT VENTRICULAR SYSTOLIC FUNCTION WITH AN ESTIMATED EF = >55 % NORMAL RIGHT VENTRICULAR SYSTOLIC FUNCTION MILD TRICUSPID AND MITRAL VALVE INSUFFICIENCY TRACE AORTIC VALVE INSUFFICIENCY NO VALVULAR STENOSIS  She was admitted to [facility] for observation/telemetry unit, rule out acute coronary syndrome with cardiac enzymes (troponin x3 - negative), TSH normal, CMP/magnesium/CBC unremarkable. Thought to be secondary to anesthesia per patient report. She has not seen Dr. Clayborn Bigness since this event.    Pathology:  DIAGNOSIS:  A. ENDOCERVIX; CURETTAGE:  - SQUAMOUS AND ENDOCERVICAL EPITHELIUM.  - NEGATIVE FOR ATYPIA AND MALIGNANCY.   B. ENDOMETRIUM; CURETTAGE:  - HIGH-GRADE ENDOMETRIAL CARCINOMA, SEE COMMENT.  - FRAGMENTS OF ENDOMETRIAL POLYP.  Comment:  In this sample, grade 1 endometrioid adenocarcinoma is admixed with  high-grade carcinoma. In some fragments there is well-differentiated  carcinoma juxtaposed with poorly differentiated carcinoma without any  transitional morphology. The high-grade carcinoma exhibits sheet-like  growth with extensive geographic necrosis. Some areas show cytoplasmic  clearing. The high-grade component exhibits marked nuclear pleomorphism  and prominent nucleoli.   Immunohistochemistry (IHC) was performed for further characterization.  The poorly differentiated component is negative for estrogen receptors  (ER), and the well-differentiated component is ER positive. The two  components otherwise show the same IHC profile: PAX8 positive (strong),  EMA positive (strong), p53 wild-type  expression, and intact DNA mismatch  repair (MMR) proteins. The preserved MMR proteins means that  microsatellite instability and Lynch syndrome are unlikely.   The classification  of this high-grade endometrial carcinoma is  difficult, because it has some features of dedifferentiated endometrial  carcinoma, but only limited loss of IHC markers that are usually only  weakly expressed or lost in dedifferentiated carcinoma. Also, there is  more cytologic pleomorphism than is usually described. However, the  morphology does not support grade 3 endometrioid carcinoma. Features of  clear cell carcinoma and serous carcinoma are not identified.    Of note she also has a h/o PE in 2005 after a transatlantic flight to [place].   At the time of her event, she had a partial hypercoagulable workup performed that revealed a modestly low protein S level of 55% with an INR of 1.5. Protein C was 81% and AT3 was 118%. Testing for factor V Leiden and antiphospholipid antibodies was negative. She was referred to the hemostasis and thrombosis clinic here in mid 2005 to consider whether to discontinue warfarin therapy. Dr. Dionne Milo reviewed her outside laboratory data, imaging studies and the extensive laboratory testing at [facility]. The mild protein S decrease in August 2004 may not truly reflect protein S deficiency since she still had some effect of Coumadin on her INR (1.5 at the time that level was determined). He felt that her pulmonary emboli most likely reflected an acquired hypercoagulable state, related to the transatlantic flight and she did not need to stay on warfarin therapy from that point. Repeat protein S level normal in 2/06 when she was off therapy. She uses prophylactic anticoagulation for long flights.   She presents today for evaluation.      Problem List: Patient Active Problem List   Diagnosis Date Noted  . Bradycardia 10/03/2017  . Cough 03/27/2013  . Abnormal chest CT 03/27/2013    Past  Medical History: Past Medical History:  Diagnosis Date  . Anxiety   . Arthritis   . Depression   . Diverticulosis   . GERD (gastroesophageal reflux disease)   . History of blood clots 2010   Pulmonary embolism  . Hyperlipidemia   . Hypertension   . Thyroid disease    hypothyroidism  . Tremor     Past Surgical History: Past Surgical History:  Procedure Laterality Date  . APPENDECTOMY    . [place]  . DILATATION & CURETTAGE/HYSTEROSCOPY WITH MYOSURE N/A 10/03/2017   Procedure: DILATATION & CURETTAGE/HYSTEROSCOPY WITH MYOSURE;  Surgeon: Schermerhorn, Gwen Her, MD;  Location: [facility] ORS;  Service: Gynecology;  Laterality: N/A;  . HERNIA REPAIR     Umbilical Hernia  . HYSTEROSCOPY W/D&C N/A 10/03/2017   Procedure: DILATATION AND CURETTAGE /HYSTEROSCOPY;  Surgeon: Schermerhorn, Gwen Her, MD;  Location: [facility] ORS;  Service: Gynecology;  Laterality: N/A;  . TONSILLECTOMY    . TUBAL LIGATION      Past Gynecologic History:  See H&P  OB History:  OB History  Gravida Para Term Preterm AB Living  3 3          SAB TAB Ectopic Multiple Live Births               # Outcome Date GA Lbr Len/2nd Weight Sex Delivery Anes PTL Lv  3 Para           2 Para           1 Para             Obstetric Comments  C-section x 3    Family History: Family History  Problem Relation Age of Onset  . Hypertension Mother   .  Heart disease Mother   . Hypertension Father   . Prostate cancer Father   . Prostate cancer Brother   . Breast cancer Cousin   . Breast cancer Cousin   . Leukemia Cousin     Social History: Social History   Socioeconomic History  . Marital status: Married    Spouse name: Not on file  . Number of children: Not on file  . Years of education: Not on file  . Highest education level: Not on file  Social Needs  . Financial resource strain: Not on file  . Food insecurity - worry: Not on file  . Food insecurity - inability: Not on file  .  Transportation needs - medical: Not on file  . Transportation needs - non-medical: Not on file  Occupational History  . Not on file  Tobacco Use  . Smoking status: Former Smoker    Types: Cigarettes    Last attempt to quit: 10/19/1971    Years since quitting: 46.0  . Smokeless tobacco: Never Used  Substance and Sexual Activity  . Alcohol use: Yes    Comment: occ. wine  . Drug use: No  . Sexual activity: Not on file  Other Topics Concern  . Not on file  Social History Narrative  . Not on file    Allergies: Allergies  Allergen Reactions  . Cortisone Hives and Other (See Comments)    Burning/swelling.  Clementeen Hoof [Iodinated Diagnostic Agents] Other (See Comments)    Burning/swelling.  . Tramadol Nausea And Vomiting and Other (See Comments)    dizziness    Current Medications: Current Outpatient Medications  Medication Sig Dispense Refill  . atorvastatin (LIPITOR) 40 MG tablet Take 40 mg by mouth daily.  0  . celecoxib (CELEBREX) 200 MG capsule Take 200 mg by mouth daily.    . cetirizine (ZYRTEC) 10 MG tablet Take 10 mg by mouth daily.     Marland Kitchen levothyroxine (SYNTHROID, LEVOTHROID) 100 MCG tablet Take 100 mcg by mouth daily before breakfast.  0  . meclizine (ANTIVERT) 25 MG tablet Take 25 mg by mouth 3 (three) times daily as needed (for vertigo).     . pantoprazole (PROTONIX) 40 MG tablet Take 40 mg by mouth daily before breakfast.  0  . triamcinolone cream (KENALOG) 0.5 % Apply 1 application topically daily as needed (applied to dry/rough area of elbows.).    Marland Kitchen triamterene-hydrochlorothiazide (MAXZIDE) 75-50 MG per tablet Take 1 tablet by mouth daily.     Marland Kitchen venlafaxine XR (EFFEXOR-XR) 150 MG 24 hr capsule Take 150 mg by mouth daily.     No current facility-administered medications for this visit.     Review of Systems General: weight gain  HEENT: no complaints  Lungs: no complaints  Cardiac: no complaints  GI: bloating  GU: PMB  Musculoskeletal: joint pain  Extremities:  no complaints  Skin: no complaints  Neuro: no complaints  Endocrine: no complaints  Psych: no complaints       Objective:  Physical Examination:  BP (!) 147/88   Pulse 76   Temp 97.8 F (36.6 C) (Tympanic)   Resp 18   Ht 5' 2"  (1.575 m)   Wt 209 lb 6.4 oz (95 kg)   BMI 38.30 kg/m    ECOG Performance Status: 1 - Symptomatic but completely ambulatory  General appearance: alert, cooperative and appears stated age HEENT:PERRLA, extra ocular movement intact and sclera clear, anicteric Lymph node survey: non-palpable, axillary, inguinal, supraclavicular Cardiovascular: regular rate and rhythm  Respiratory: RU lobe wheezing otherwise negative Abdomen: soft, non-tender, without masses or organomegaly, protuberant, nondistended, no hernias or ascites; well healed vertical incision Back: inspection of back is normal Extremities: extremities normal, atraumatic, no cyanosis or edema Skin exam - normal coloration and turgor, no rashes, no suspicious skin lesions noted. Neurological exam reveals alert, oriented, normal speech, no focal findings or movement disorder noted.  Pelvic: exam chaperoned by nurse;  Vulva: normal appearing vulva with no masses, tenderness or lesions; Pubic arch: very narrow < 2 FBs; Vagina: normal vagina; Adnexa: no masses but very limited exam; Uterus: uterus unable to determine size, shape, but nontender; Cervix: no gross lesion; Rectal: not indicated and normal rectal, no masses    Lab Review                      Labs on site today: n/a  Radiologic Imaging: 04/26/2013 IMPRESSION:  Mild opacities in the lingula are relatively linear, and decreased in  conspicuity from prior. These are like related to resolving atelectasis.  However, followup noncontrast chest CT is suggested in 6 months    Assessment:  Mary Meyers is a 72 y.o. female diagnosed with high grade endometrial cancer.   Cardiac issues with bradycardia and hypotension  postoperatively.   Past medical history of PE- provoked after extended airplane travel. Borderline protein-s deficiency on initial evaluation after inciting event which was normal 1 year later. Likely borderline d/t coumadin therapy at the time of initial evlauation. No family history of clots. Other hypercoagulable workup normal. Was determined she did not require lifelong anticoagulation. In setting of frequent extended travel she takes prophylactic Lovenox.   History of abnormal chest CT scan.   Constrast allergy.   Medical co-morbidities complicating care: h/o PE, bradycardia/hypotension, h/o pelvic fracture , HTN, obesity and prior abdominal surgery.    Plan:   Problem List Items Addressed This Visit      Other   Bradycardia    Other Visit Diagnoses    Endometrial cancer ([place])    -  Primary   Relevant Orders   CT ABDOMEN PELVIS WO CONTRAST   CT CHEST WO CONTRAST   Amb Referral to Nutrition and Diabetic Education      We discussed options for management including minimally invasive surgery, TLH, BSO and nodal asessement. For SLN injection she will need methylene blue rather than IVG. Given her medical comorbidities we recommended doing her surgery at [facility]. She is tentatively scheduled for surgery on 11/14/2016. We also discussed that given the high grade lesion she may need adjuvant chemotherapy and/or radiation after surgery. She understands that her surgery may need to be converted to open approach because of adhesive disease as well as removal of the specimen as she has very narrow pelvis and vaginal vault. The office will set up her preop appointment and [facility]. We contacted Dr. Clayborn Bigness to contact us to discuss her cardiac issues.   CT scan chest, abdomen, and pelvis ordered to assess for metastatic disease. Noncontrasted study ordered given contrast allergy.   Suggested return to clinic in  1-2 weeks after her surgery for pathology review.   The patient's diagnosis, an outline  of the further diagnostic and laboratory studies which will be required, the recommendation, and alternatives were discussed.  All questions were answered to the patient's satisfaction.  A total of 75 minutes were spent with the patient/family today; 75% was spent in education, counseling and coordination of care for endometrial cancer.  Gillis Ends, MD    CC:  Laverta Baltimore, MD

## 2017-10-19 ENCOUNTER — Inpatient Hospital Stay: Payer: Medicare Other | Attending: Obstetrics and Gynecology | Admitting: Obstetrics and Gynecology

## 2017-10-19 VITALS — BP 147/88 | HR 76 | Temp 97.8°F | Resp 18 | Ht 62.0 in | Wt 209.4 lb

## 2017-10-19 DIAGNOSIS — R001 Bradycardia, unspecified: Secondary | ICD-10-CM | POA: Insufficient documentation

## 2017-10-19 DIAGNOSIS — F329 Major depressive disorder, single episode, unspecified: Secondary | ICD-10-CM | POA: Insufficient documentation

## 2017-10-19 DIAGNOSIS — M1990 Unspecified osteoarthritis, unspecified site: Secondary | ICD-10-CM | POA: Insufficient documentation

## 2017-10-19 DIAGNOSIS — E039 Hypothyroidism, unspecified: Secondary | ICD-10-CM | POA: Insufficient documentation

## 2017-10-19 DIAGNOSIS — K219 Gastro-esophageal reflux disease without esophagitis: Secondary | ICD-10-CM | POA: Insufficient documentation

## 2017-10-19 DIAGNOSIS — Z86711 Personal history of pulmonary embolism: Secondary | ICD-10-CM | POA: Diagnosis not present

## 2017-10-19 DIAGNOSIS — C541 Malignant neoplasm of endometrium: Secondary | ICD-10-CM | POA: Insufficient documentation

## 2017-10-19 DIAGNOSIS — F419 Anxiety disorder, unspecified: Secondary | ICD-10-CM | POA: Insufficient documentation

## 2017-10-19 DIAGNOSIS — E785 Hyperlipidemia, unspecified: Secondary | ICD-10-CM | POA: Diagnosis not present

## 2017-10-19 DIAGNOSIS — Z87891 Personal history of nicotine dependence: Secondary | ICD-10-CM | POA: Diagnosis not present

## 2017-10-19 DIAGNOSIS — I10 Essential (primary) hypertension: Secondary | ICD-10-CM | POA: Diagnosis not present

## 2017-10-19 DIAGNOSIS — Z79899 Other long term (current) drug therapy: Secondary | ICD-10-CM | POA: Diagnosis not present

## 2017-10-19 NOTE — Patient Instructions (Signed)
Uterine Cancer Uterine cancer is an abnormal growth of cancer tissue (malignant tumor) in the uterus. Unlike noncancerous (benign) tumors, malignant tumors can spread to other parts of the body. Uterine cancer usually occurs after menopause. However, it may also occur around the time that menopause begins. The wall of the uterus has an inner layer of tissue (endometrium) and an outer layer of muscle tissue (myometrium). The most common type of uterine cancer begins in the endometrium (endometrial cancer). Cancer that begins in the myometrium (uterine sarcoma) is very rare. What are the causes? The exact cause of this condition is not known. What increases the risk? You are more likely to develop this condition if you:  Are older than 50.  Have an enlarged endometrium (endometrial hyperplasia).  Use hormone therapy.  Are severely overweight (obese).  Use the medicine tamoxifen.  You are white (Caucasian).  Cannot bear children (are infertile).  Have never been pregnant.  Started menstruating at an age younger than 12 years.  Are older than 52 and are still having menstrual periods.  Have a history of cancer of the ovaries, intestines, or colon or rectum (colorectal cancer).  Have a history of enlarged ovaries with small cysts (polycystic ovarian syndrome).  Have a family history of: ? Uterine cancer. ? Hereditary nonpolyposis colon cancer (HNPCC).  Have diabetes, high blood pressure, thyroid disease, or gallbladder disease.  Use long-term, high-dose birth control pills.  Have been exposed to radiation.  Smoke.  What are the signs or symptoms? Symptoms of this condition include:  Abnormal vaginal bleeding or discharge. Bleeding may start as a watery, blood-streaked flow that gradually contains more blood. This is the most common symptom. If you experience abnormal vaginal bleeding, do not assume that it is part of menopause.  Vaginal bleeding after  menopause.  Unexplained weight loss.  Bleeding between periods.  Urination that is difficult, painful, or more frequent than usual.  A lump (mass) in the vagina.  Pain, bloating, or fullness in the abdomen.  Pain in the pelvic area.  Pain during sex.  How is this diagnosed? This condition may be diagnosed based on:  Your medical history and your symptoms.  A physical and pelvic exam. Your health care provider will feel your pelvis for any growths or enlarged lymph nodes.  Blood and urine tests.  Imaging tests, such as X-rays, CT scans, ultrasound, or MRIs.  A procedure in which a thin, flexible tube with a light and camera on the end is inserted through the vagina and used to look inside the uterus (hysteroscopy).  A Pap test to check for abnormal cells in the lower part of the uterus (cervix) and the upper vagina.  Removing a tissue sample (biopsy) from the uterine lining to check for cancer cells.  Dilation and curettage (D&C). This is a procedure that involves stretching (dilation) the cervix and scraping (curettage) the inside lining of the uterus to get a biopsy and check for cancer cells.  Your cancer will be staged to determine its severity and extent. Staging is an assessment of:  The size of the tumor.  Whether the cancer has spread.  Where the cancer has spread.  The stages of uterine cancer are as follows:  Stage I. The cancer is only found in the uterus.  Stage II. The cancer has spread to the cervix.  Stage III. The cancer has spread outside the uterus, but not outside the pelvis. The cancer may have spread to the lymph nodes in the pelvis.  Lymph nodes are part of your body's disease-fighting (immune) system. Lymph nodes are found in many locations in your body, including the neck, underarm, and groin.  Stage IV. The cancer has spread to other parts of the body, such as the bladder or rectum.  How is this treated? This condition is often treated with  surgery to remove:  The uterus, cervix, fallopian tubes, and ovaries (total hysterectomy).  The uterus and cervix (simple hysterectomy).  The type of hysterectomy you will have depends on the extent of your cancer. Lymph nodes near the uterus may also be removed in some cases. Treatment may also include one or more of the following:  Chemotherapy. This uses medicines to kill the cancer cells and prevent their spread.  Radiation therapy. This uses high-energy rays to kill the cancer cells and prevent the spread of cancer.  Chemoradiation. This is a combination treatment that alternates chemotherapy with radiation treatments to enhance the way radiation works.  Brachytherapy. This involves placing radioactive materials inside the body where the cancer was removed.  Hormone therapy. This includes taking medicines that lower the levels of estrogen in the body.  Follow these instructions at home: Activity  Return to your normal activities as told by your health care provider. Ask your health care provider what activities are safe for you.  Exercise regularly as told by your health care provider.  Do not drive or use heavy machinery while taking prescription pain medicine. General instructions  Take over-the-counter and prescription medicines only as told by your health care provider.  Maintain a healthy diet.  Work with your health care provider to: ? Manage any long-term (chronic) conditions you have, such as diabetes, high blood pressure, thyroid disease, or gallbladder disease. ? Manage any side effects of your treatment.  Do not use any products that contain nicotine or tobacco, such as cigarettes and e-cigarettes. If you need help quitting, ask your health care provider.  Consider joining a support group to help you cope with stress. Your health care provider may be able to recommend a local or online support group.  Keep all follow-up visits as told by your health care  provider. This is important. Where to find more information:  American Cancer Society: https://www.cancer.Miguel Barrera ([place]): https://www.cancer.gov Contact a health care provider if:  You have pain in your pelvis or abdomen that gets worse.  You cannot urinate.  You have abnormal bleeding.  You have a fever. Get help right away if:  You develop sudden or new severe symptoms, such as: ? Heavy bleeding. ? Severe weakness. ? Pain that is severe or does not get better with medicine. Summary  Uterine cancer is an abnormal growth of cancer tissue (malignant tumor) in the uterus. The most common type of uterine cancer begins in the endometrium (endometrial cancer).  This condition is often treated with surgery to remove the uterus, cervix, fallopian tubes, and ovaries (total hysterectomy) or the uterus and cervix (simple hysterectomy).  Work with your health care provider to manage any long-term (chronic) conditions you have, such as diabetes, high blood pressure, thyroid disease, or gallbladder disease.  Consider joining a support group to help you cope with stress. Your health care provider may be able to recommend a local or online support group. This information is not intended to replace advice given to you by your health care provider. Make sure you discuss any questions you have with your health care provider. Document Released: 10/04/2005 Document Revised: 10/01/2016 Document  Reviewed: 10/01/2016 Elsevier Interactive Patient Education  2018 [place] January 28th, 2018 for surgery - The office will contact you with additional appointment information

## 2017-10-19 NOTE — Progress Notes (Signed)
Pt here for new eval for endometiral cancer

## 2017-10-21 ENCOUNTER — Other Ambulatory Visit: Payer: Self-pay | Admitting: *Deleted

## 2017-10-21 ENCOUNTER — Telehealth: Payer: Self-pay | Admitting: *Deleted

## 2017-10-21 NOTE — Telephone Encounter (Signed)
Per Beckey Rutter, NP's request, called Dr. Etta Quill office and requested surgical clearance from Cardiology for patient to have GYN surgery at [facility] on November 14, 2017; they will fax to [place].           dhs

## 2017-10-25 ENCOUNTER — Telehealth: Payer: Self-pay

## 2017-10-25 NOTE — Telephone Encounter (Signed)
Call placed to Dr. Etta Quill office with permission from Ms. Vanschaick. Appointment obtained for 1/10 at 1045. She has been notified of this appointment at [facility] Cardiology. She will have her CT following at 1130 at [facility]. Read back performed. Oncology Nurse Navigator Documentation  Navigator Location: [facility] (10/25/17 0900)   )Navigator Encounter Type: Telephone (10/25/17 0900) Telephone: Outgoing Call (10/25/17 0900)                                                  Time Spent with Patient: 15 (10/25/17 0900)

## 2017-10-25 NOTE — Telephone Encounter (Signed)
Voicemail left on listed home and mobile number to return call today regarding an appointment need to see Cardiologist. Oncology Nurse Navigator Documentation  Navigator Location: [facility] (10/25/17 0800)   )Navigator Encounter Type: Telephone (10/25/17 0800) Telephone: Lahoma Crocker Call (10/25/17 0800)                                                  Time Spent with Patient: 15 (10/25/17 0800)

## 2017-10-27 ENCOUNTER — Ambulatory Visit: Admission: RE | Admit: 2017-10-27 | Payer: Medicare Other | Source: Ambulatory Visit

## 2017-10-27 ENCOUNTER — Ambulatory Visit
Admission: RE | Admit: 2017-10-27 | Discharge: 2017-10-27 | Disposition: A | Discharge status: Home / Self Care | Payer: Medicare Other | Source: Ambulatory Visit | Attending: Nurse Practitioner | Admitting: Nurse Practitioner

## 2017-10-27 DIAGNOSIS — K5730 Diverticulosis of large intestine without perforation or abscess without bleeding: Secondary | ICD-10-CM | POA: Insufficient documentation

## 2017-10-27 DIAGNOSIS — I2510 Atherosclerotic heart disease of native coronary artery without angina pectoris: Secondary | ICD-10-CM | POA: Insufficient documentation

## 2017-10-27 DIAGNOSIS — I700 Atherosclerosis of aorta: Secondary | ICD-10-CM | POA: Diagnosis not present

## 2017-10-27 DIAGNOSIS — C541 Malignant neoplasm of endometrium: Secondary | ICD-10-CM | POA: Insufficient documentation

## 2017-11-01 DIAGNOSIS — I10 Essential (primary) hypertension: Secondary | ICD-10-CM | POA: Insufficient documentation

## 2017-11-02 ENCOUNTER — Encounter: Payer: Self-pay | Admitting: *Deleted

## 2017-11-02 ENCOUNTER — Encounter
Admission: RE | Disposition: A | Discharge status: Home / Self Care | Payer: Self-pay | Source: Ambulatory Visit | Attending: Internal Medicine

## 2017-11-02 ENCOUNTER — Ambulatory Visit
Admission: RE | Admit: 2017-11-02 | Discharge: 2017-11-02 | Disposition: A | Discharge status: Home / Self Care | Payer: Medicare Other | Source: Ambulatory Visit | Attending: Internal Medicine | Admitting: Internal Medicine

## 2017-11-02 ENCOUNTER — Other Ambulatory Visit: Payer: Self-pay | Admitting: Internal Medicine

## 2017-11-02 DIAGNOSIS — E079 Disorder of thyroid, unspecified: Secondary | ICD-10-CM | POA: Insufficient documentation

## 2017-11-02 DIAGNOSIS — E782 Mixed hyperlipidemia: Secondary | ICD-10-CM | POA: Diagnosis not present

## 2017-11-02 DIAGNOSIS — I10 Essential (primary) hypertension: Secondary | ICD-10-CM | POA: Insufficient documentation

## 2017-11-02 DIAGNOSIS — Z8542 Personal history of malignant neoplasm of other parts of uterus: Secondary | ICD-10-CM | POA: Diagnosis not present

## 2017-11-02 DIAGNOSIS — K219 Gastro-esophageal reflux disease without esophagitis: Secondary | ICD-10-CM | POA: Insufficient documentation

## 2017-11-02 DIAGNOSIS — Z79899 Other long term (current) drug therapy: Secondary | ICD-10-CM | POA: Diagnosis not present

## 2017-11-02 DIAGNOSIS — I200 Unstable angina: Secondary | ICD-10-CM

## 2017-11-02 DIAGNOSIS — I2510 Atherosclerotic heart disease of native coronary artery without angina pectoris: Secondary | ICD-10-CM

## 2017-11-02 DIAGNOSIS — E669 Obesity, unspecified: Secondary | ICD-10-CM | POA: Diagnosis not present

## 2017-11-02 DIAGNOSIS — Z79890 Hormone replacement therapy (postmenopausal): Secondary | ICD-10-CM | POA: Insufficient documentation

## 2017-11-02 HISTORY — PX: LEFT HEART CATH AND CORONARY ANGIOGRAPHY: CATH118249

## 2017-11-02 LAB — CARDIAC CATHETERIZATION: Cath EF Quantitative: 55 %

## 2017-11-02 SURGERY — LEFT HEART CATH AND CORONARY ANGIOGRAPHY
Anesthesia: Moderate Sedation | Laterality: Left

## 2017-11-02 SURGERY — LEFT HEART CATH AND CORONARY ANGIOGRAPHY
Anesthesia: Moderate Sedation

## 2017-11-02 MED ORDER — METHYLPREDNISOLONE SODIUM SUCC 125 MG IJ SOLR
125.0000 mg | Freq: Once | INTRAMUSCULAR | Status: AC
  Administered 2017-11-02: 125 mg via INTRAVENOUS

## 2017-11-02 MED ORDER — SODIUM CHLORIDE 0.9% FLUSH: 3.0000 mL | INTRAVENOUS | Status: DC | PRN

## 2017-11-02 MED ORDER — IOPAMIDOL (ISOVUE-300) INJECTION 61%
INTRAVENOUS | Status: DC | PRN
  Administered 2017-11-02: 100 mL via INTRA_ARTERIAL

## 2017-11-02 MED ORDER — HEPARIN (PORCINE) IN NACL 2-0.9 UNIT/ML-% IJ SOLN
INTRAMUSCULAR | Status: AC
  Filled 2017-11-02: qty 500

## 2017-11-02 MED ORDER — SODIUM CHLORIDE 0.9 % WEIGHT BASED INFUSION
3.0000 mL/kg/h | INTRAVENOUS | Status: AC
  Administered 2017-11-02: 3 mL/kg/h via INTRAVENOUS

## 2017-11-02 MED ORDER — FENTANYL CITRATE (PF) 100 MCG/2ML IJ SOLN
INTRAMUSCULAR | Status: AC
  Filled 2017-11-02: qty 2

## 2017-11-02 MED ORDER — SODIUM CHLORIDE 0.9 % WEIGHT BASED INFUSION: 1.0000 mL/kg/h | INTRAVENOUS | Status: DC

## 2017-11-02 MED ORDER — ASPIRIN 81 MG PO CHEW
81.0000 mg | CHEWABLE_TABLET | ORAL | Status: AC
  Administered 2017-11-02: 81 mg via ORAL

## 2017-11-02 MED ORDER — DIPHENHYDRAMINE HCL 50 MG/ML IJ SOLN
INTRAMUSCULAR | Status: AC
  Administered 2017-11-02: 50 mg via INTRAVENOUS
  Filled 2017-11-02: qty 1

## 2017-11-02 MED ORDER — DIPHENHYDRAMINE HCL 50 MG/ML IJ SOLN
50.0000 mg | Freq: Once | INTRAMUSCULAR | Status: AC
  Administered 2017-11-02: 50 mg via INTRAVENOUS

## 2017-11-02 MED ORDER — FENTANYL CITRATE (PF) 100 MCG/2ML IJ SOLN
INTRAMUSCULAR | Status: DC | PRN
  Administered 2017-11-02: 50 ug via INTRAVENOUS

## 2017-11-02 MED ORDER — SODIUM CHLORIDE 0.9 % IV SOLN: 250.0000 mL | INTRAVENOUS | Status: DC | PRN

## 2017-11-02 MED ORDER — SODIUM CHLORIDE 0.9% FLUSH: 3.0000 mL | Freq: Two times a day (BID) | INTRAVENOUS | Status: DC

## 2017-11-02 MED ORDER — ONDANSETRON HCL 4 MG/2ML IJ SOLN: 4.0000 mg | Freq: Four times a day (QID) | INTRAMUSCULAR | Status: DC | PRN

## 2017-11-02 MED ORDER — MIDAZOLAM HCL 2 MG/2ML IJ SOLN
INTRAMUSCULAR | Status: AC
  Filled 2017-11-02: qty 2

## 2017-11-02 MED ORDER — MIDAZOLAM HCL 2 MG/2ML IJ SOLN
INTRAMUSCULAR | Status: DC | PRN
  Administered 2017-11-02: 1 mg via INTRAVENOUS

## 2017-11-02 MED ORDER — ASPIRIN 81 MG PO CHEW
CHEWABLE_TABLET | ORAL | Status: AC
  Filled 2017-11-02: qty 1

## 2017-11-02 MED ORDER — METHYLPREDNISOLONE SODIUM SUCC 125 MG IJ SOLR
INTRAMUSCULAR | Status: AC
  Administered 2017-11-02: 125 mg via INTRAVENOUS
  Filled 2017-11-02: qty 2

## 2017-11-02 MED ORDER — ACETAMINOPHEN 325 MG PO TABS: 650.0000 mg | ORAL_TABLET | ORAL | Status: DC | PRN

## 2017-11-02 SURGICAL SUPPLY — 9 items
CATH 5FR JL4 DIAGNOSTIC (CATHETERS) ×3 IMPLANT
CATH INFINITI 5FR ANG PIGTAIL (CATHETERS) ×3 IMPLANT
CATH INFINITI JR4 5F (CATHETERS) ×3 IMPLANT
DEVICE CLOSURE MYNXGRIP 5F (Vascular Products) ×3 IMPLANT
KIT MANI 3VAL PERCEP (MISCELLANEOUS) ×3 IMPLANT
NEEDLE PERC 18GX7CM (NEEDLE) ×3 IMPLANT
PACK CARDIAC CATH (CUSTOM PROCEDURE TRAY) ×3 IMPLANT
SHEATH AVANTI 5FR X 11CM (SHEATH) ×3 IMPLANT
WIRE EMERALD 3MM-J .035X150CM (WIRE) ×3 IMPLANT

## 2017-11-03 ENCOUNTER — Encounter: Payer: Self-pay | Admitting: Internal Medicine

## 2017-11-03 NOTE — Progress Notes (Signed)
Cardiac Cath results with surgery clearance sent to Dr. Theora Gianotti. Oncology Nurse Navigator Documentation  Navigator Location: [facility] (11/03/17 0800)   )Navigator Encounter Type: Letter/Fax/Email;Diagnostic Results (11/03/17 0800)                                                    Time Spent with Patient: 15 (11/03/17 0800)

## 2017-11-14 DIAGNOSIS — Z90710 Acquired absence of both cervix and uterus: Secondary | ICD-10-CM | POA: Insufficient documentation

## 2017-11-17 ENCOUNTER — Telehealth: Payer: Self-pay

## 2017-11-17 NOTE — Telephone Encounter (Signed)
Per inbox from [facility], foley removal has been scheduled for 2/4 at 1000.  Please have Mary Meyers follow up in [place] for RN visit for foley removal on Monday 2/4   She is a patient of Dr. Theora Gianotti   Thank you --   [name], MD  Obstetrics & Gynecology, PGY-3  Pager [phone]    Post op visit rescheduled from 2/13 at request of  Dr. Theora Gianotti to 2/27 at 1000.  Mary Meyers can you please schedule a follow up appt for her to see me at [facility] for her postop 2/27.     Oncology Nurse Navigator Documentation  Navigator Location: [facility] (11/17/17 1500)   )Navigator Encounter Type: Telephone (11/17/17 1500)                                                    Time Spent with Patient: 15 (11/17/17 1500)

## 2017-11-21 NOTE — Progress Notes (Signed)
16 french foley catheter removed without difficulty. Clear yellow urine in bag. Voided on her own after removal. Steri strips remain intact. Denies any pain. Eating, drinking, and ambulating well.Provided post op appt for 2/27 and contact information for any questions or concerns. Oncology Nurse Navigator Documentation  Navigator Location: [facility] (11/21/17 1000)   )Navigator Encounter Type: Other(Nurse visit for foley removal) (11/21/17 1000)                                                    Time Spent with Patient: 30 (11/21/17 1000)

## 2017-11-30 ENCOUNTER — Ambulatory Visit: Payer: Medicare Other

## 2017-12-13 DIAGNOSIS — C541 Malignant neoplasm of endometrium: Secondary | ICD-10-CM | POA: Insufficient documentation
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
## 2017-12-14 ENCOUNTER — Inpatient Hospital Stay: Payer: Medicare Other | Attending: Obstetrics and Gynecology | Admitting: Obstetrics and Gynecology

## 2017-12-14 VITALS — BP 123/75 | HR 75 | Temp 97.6°F | Resp 18 | Ht 62.0 in | Wt 212.9 lb

## 2017-12-14 DIAGNOSIS — R001 Bradycardia, unspecified: Secondary | ICD-10-CM | POA: Diagnosis not present

## 2017-12-14 DIAGNOSIS — Z90722 Acquired absence of ovaries, bilateral: Secondary | ICD-10-CM | POA: Diagnosis not present

## 2017-12-14 DIAGNOSIS — Z90710 Acquired absence of both cervix and uterus: Secondary | ICD-10-CM | POA: Diagnosis not present

## 2017-12-14 DIAGNOSIS — C541 Malignant neoplasm of endometrium: Secondary | ICD-10-CM | POA: Insufficient documentation

## 2017-12-14 DIAGNOSIS — Z87891 Personal history of nicotine dependence: Secondary | ICD-10-CM | POA: Diagnosis not present

## 2017-12-14 NOTE — Progress Notes (Signed)
No new GYN changes / patient c/o fatigue with SOB

## 2017-12-15 NOTE — Progress Notes (Signed)
Referral sent to Dr. Alycia Rossetti with confirmation of receipt. Oncology Nurse Navigator Documentation  Navigator Location: [facility] (12/15/17 1600)   )Navigator Encounter Type: Letter/Fax/Email (12/15/17 1600)                                                    Time Spent with Patient: 15 (12/15/17 1600)

## 2017-12-16 ENCOUNTER — Other Ambulatory Visit: Payer: Self-pay

## 2017-12-19 ENCOUNTER — Inpatient Hospital Stay: Payer: Medicare Other | Attending: Obstetrics and Gynecology

## 2017-12-19 DIAGNOSIS — C541 Malignant neoplasm of endometrium: Secondary | ICD-10-CM | POA: Insufficient documentation

## 2017-12-19 NOTE — Progress Notes (Signed)
Nutrition Assessment  Reason for Assessment: Consult (obesity)    ASSESSMENT:   72 year old female with endometrial cancer.  Patient s/p total hysterectomy with bilaterial salpingoophrectomy on 11/14/17 at [facility].  Patient reports that she is cancer free.  Planning consult with radiation oncology to see if radiation would be helpful.    Met with patient, husband and son in clinic this am.  Patient reports that she is interested in learning how to eat healthy and to loose weight to reduce cancer recurrence.  Reports good appetite and has gained weight following surgery.   MEDICATIONS: reviewed   LABS: glucose 107   ANTHROPOMETRICS: Height:   62 inches Weight:  212 lb 14.4 oz BMI:  38   NUTRITION DIAGNOSIS:  Food and nutrition related knowledge deficit related to other (see comment)(obesity and increased risk of recurrence) as evidenced by (wanting nutrition recommendations for weight loss)   INTERVENTION:   Reviewed [email or web address] website and walked patient through steps to determine calorie needs and printed meal plan.   Discussed goals of plant-based diet focusing on fruits, vegetables, whole grains and lean sources of protein.   Discussed portion sizes. Encouraged patient to discuss exercise with MD and if able to begin.  Noted referal to CARE program.   Discussed ways to handle cravings especially at night.   Discussed if patient needed more support referral to [place] [address] for more in depth weight loss education.   GOAL:  (Improve weight and overall health)   MONITOR:  Weight trends   Next Visit: none at this time.  Patient has contact information.     Mary Meyers B. Zenia Resides, [place], [place] Registered Dietitian [phone] (pager)

## 2017-12-21 ENCOUNTER — Encounter: Payer: Self-pay | Admitting: Radiation Oncology

## 2017-12-21 ENCOUNTER — Ambulatory Visit
Admission: RE | Admit: 2017-12-21 | Discharge: 2017-12-21 | Disposition: A | Discharge status: Home / Self Care | Payer: Medicare Other | Source: Ambulatory Visit | Attending: Radiation Oncology | Admitting: Radiation Oncology

## 2017-12-21 ENCOUNTER — Other Ambulatory Visit: Payer: Self-pay

## 2017-12-21 VITALS — BP 146/89 | HR 70 | Temp 96.9°F | Wt 209.8 lb

## 2017-12-21 DIAGNOSIS — Z87891 Personal history of nicotine dependence: Secondary | ICD-10-CM | POA: Insufficient documentation

## 2017-12-21 DIAGNOSIS — F418 Other specified anxiety disorders: Secondary | ICD-10-CM | POA: Diagnosis not present

## 2017-12-21 DIAGNOSIS — Z803 Family history of malignant neoplasm of breast: Secondary | ICD-10-CM | POA: Diagnosis not present

## 2017-12-21 DIAGNOSIS — Z8781 Personal history of (healed) traumatic fracture: Secondary | ICD-10-CM | POA: Diagnosis not present

## 2017-12-21 DIAGNOSIS — C541 Malignant neoplasm of endometrium: Secondary | ICD-10-CM | POA: Insufficient documentation

## 2017-12-21 DIAGNOSIS — E079 Disorder of thyroid, unspecified: Secondary | ICD-10-CM | POA: Diagnosis not present

## 2017-12-21 DIAGNOSIS — Z86711 Personal history of pulmonary embolism: Secondary | ICD-10-CM | POA: Insufficient documentation

## 2017-12-21 DIAGNOSIS — Z79899 Other long term (current) drug therapy: Secondary | ICD-10-CM | POA: Insufficient documentation

## 2017-12-21 DIAGNOSIS — K219 Gastro-esophageal reflux disease without esophagitis: Secondary | ICD-10-CM | POA: Insufficient documentation

## 2017-12-21 DIAGNOSIS — E785 Hyperlipidemia, unspecified: Secondary | ICD-10-CM | POA: Diagnosis not present

## 2017-12-21 DIAGNOSIS — Z90722 Acquired absence of ovaries, bilateral: Secondary | ICD-10-CM | POA: Diagnosis not present

## 2017-12-21 DIAGNOSIS — I10 Essential (primary) hypertension: Secondary | ICD-10-CM | POA: Insufficient documentation

## 2017-12-21 DIAGNOSIS — Z8719 Personal history of other diseases of the digestive system: Secondary | ICD-10-CM | POA: Insufficient documentation

## 2017-12-21 DIAGNOSIS — Z806 Family history of leukemia: Secondary | ICD-10-CM | POA: Diagnosis not present

## 2017-12-21 DIAGNOSIS — M129 Arthropathy, unspecified: Secondary | ICD-10-CM | POA: Insufficient documentation

## 2017-12-21 DIAGNOSIS — Z90710 Acquired absence of both cervix and uterus: Secondary | ICD-10-CM | POA: Diagnosis not present

## 2017-12-21 DIAGNOSIS — Z8042 Family history of malignant neoplasm of prostate: Secondary | ICD-10-CM | POA: Insufficient documentation

## 2017-12-21 NOTE — Consult Note (Signed)
NEW PATIENT EVALUATION  Name: Mary Meyers  MRN: [id number]  Date:   12/21/2017     DOB: January 16, 1946   This 72 y.o. 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  REFERRING PHYSICIAN: Juluis Pitch, MD  CHIEF COMPLAINT:  Chief Complaint  Patient presents with  . Cancer    Initial Evaluation    DIAGNOSIS: The encounter diagnosis was Endometrial cancer ([place]).   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 She went on to have a TAH/BSO. 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 Tumor invaded 6 mm out of 18 mm of the myometrium. Lymph vascular invasion was absent. Cervix vaginal mucosa and omental biopsies were negative. A right obturator sentinel node was negative as well as a left external iliac sentinel node. Pelvic cytology was negative. Her postoperative course was compensated by brachy cardiac N/A she is seeing Dr. call when cardiology for workup. Patient is a history of pulmonary embolus in 2005 after transatlantic flight to [place]. She is seen today for consideration of adjuvant radiation therapy. She is doing well. She specifically denies vaginal discharge. She's having no lower urinary tract symptoms or bowel complaints.  PLANNED TREATMENT REGIMEN: Whole pelvic radiation plus vaginal brachytherapy boost  PAST MEDICAL HISTORY:  has a past medical history of Anxiety, Arthritis, Depression, Diverticulosis, GERD (gastroesophageal reflux disease), History of blood clots (2010), Hyperlipidemia, Hypertension, Pelvic fracture ([place]), Thyroid disease, and  Tremor.    PAST SURGICAL HISTORY:  Past Surgical History:  Procedure Laterality Date  . APPENDECTOMY    . [place]  . DILATATION & CURETTAGE/HYSTEROSCOPY WITH MYOSURE N/A 10/03/2017   Procedure: DILATATION & CURETTAGE/HYSTEROSCOPY WITH MYOSURE;  Surgeon: Schermerhorn, Gwen Her, MD;  Location: [facility];  Service: Gynecology;  Laterality: N/A;  . HERNIA REPAIR     Umbilical Hernia  . HYSTEROSCOPY W/D&C N/A 10/03/2017   Procedure: DILATATION AND CURETTAGE /HYSTEROSCOPY;  Surgeon: Schermerhorn, Gwen Her, MD;  Location: [facility];  Service: Gynecology;  Laterality: N/A;  . LEFT HEART CATH AND CORONARY ANGIOGRAPHY Left 11/02/2017   Procedure: LEFT HEART CATH AND CORONARY ANGIOGRAPHY;  Surgeon: Yolonda Kida, MD;  Location: [facility];  Service: Cardiovascular;  Laterality: Left;  . TONSILLECTOMY    . TUBAL LIGATION      FAMILY HISTORY: family history includes Breast cancer in her cousin and cousin; Heart disease in her mother; Hypertension in her father and mother; Leukemia in her cousin; Prostate cancer in her brother and father.  SOCIAL HISTORY:  reports that she quit smoking about 46 years ago. Her smoking use included cigarettes. she has never used smokeless tobacco. She reports that she drinks alcohol. She reports that she does not use drugs.  ALLERGIES: Cortisone; Ivp dye [iodinated diagnostic agents]; and Tramadol  MEDICATIONS:  Current Outpatient Medications  Medication Sig Dispense Refill  . atorvastatin (LIPITOR) 40 MG tablet Take 40 mg by mouth daily.  0  . celecoxib (CELEBREX) 200 MG capsule Take 200 mg by mouth daily.    . cetirizine (ZYRTEC) 10 MG tablet Take 10 mg by mouth daily.     Marland Kitchen levothyroxine (SYNTHROID, LEVOTHROID)  100 MCG tablet Take 100 mcg by mouth daily before breakfast.  0  . meclizine (ANTIVERT) 25 MG tablet Take 25 mg by mouth 3 (three) times daily as needed (for vertigo).     . pantoprazole (PROTONIX) 40 MG tablet Take  40 mg by mouth daily before breakfast.  0  . triamcinolone cream (KENALOG) 0.5 % Apply 1 application topically daily as needed (applied to dry/rough area of elbows.).    Marland Kitchen triamterene-hydrochlorothiazide (MAXZIDE) 75-50 MG per tablet Take 0.5 tablets by mouth daily.     Marland Kitchen venlafaxine XR (EFFEXOR-XR) 150 MG 24 hr capsule Take 150 mg by mouth daily.     No current facility-administered medications for this encounter.     ECOG PERFORMANCE STATUS:  0 - Asymptomatic  REVIEW OF SYSTEMS:  Patient denies any weight loss, fatigue, weakness, fever, chills or night sweats. Patient denies any loss of vision, blurred vision. Patient denies any ringing  of the ears or hearing loss. No irregular heartbeat. Patient denies heart murmur or history of fainting. Patient denies any chest pain or pain radiating to her upper extremities. Patient denies any shortness of breath, difficulty breathing at night, cough or hemoptysis. Patient denies any swelling in the lower legs. Patient denies any nausea vomiting, vomiting of blood, or coffee ground material in the vomitus. Patient denies any stomach pain. Patient states has had normal bowel movements no significant constipation or diarrhea. Patient denies any dysuria, hematuria or significant nocturia. Patient denies any problems walking, swelling in the joints or loss of balance. Patient denies any skin changes, loss of hair or loss of weight. Patient denies any excessive worrying or anxiety or significant depression. Patient denies any problems with insomnia. Patient denies excessive thirst, polyuria, polydipsia. Patient denies any swollen glands, patient denies easy bruising or easy bleeding. Patient denies any recent infections, allergies or URI. Patient "s visual fields have not changed significantly in recent time.    PHYSICAL EXAM: BP (!) 146/89   Pulse 70   Temp (!) 96.9 F (36.1 C)   Wt 209 lb 12.3 oz (95.1 kg)   BMI 38.37 kg/m  On speculum examination vaginal  apex is well-healed no vaginal mucosal lesions are identified bimanual examination shows no evidence of parametrial mass or nodularity rectal exam is unremarkable. Well-developed well-nourished patient in NAD. HEENT reveals PERLA, EOMI, discs not visualized.  Oral cavity is clear. No oral mucosal lesions are identified. Neck is clear without evidence of cervical or supraclavicular adenopathy. Lungs are clear to A&P. Cardiac examination is essentially unremarkable with regular rate and rhythm without murmur rub or thrill. Abdomen is benign with no organomegaly or masses noted. Motor sensory and DTR levels are equal and symmetric in the upper and lower extremities. Cranial nerves II through XII are grossly intact. Proprioception is intact. No peripheral adenopathy or edema is identified. No motor or sensory levels are noted. Crude visual fields are within normal range.  LABORATORY DATA: Pathology reports reviewed    RADIOLOGY RESULTS:CT scans of abdomen chest and pelvic reviewed showing no evidence of metastatic disease or local regional disease.   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 I would treat to 4500 cGy over 5 weeks. Also based on survey  of radiation oncology population would add vaginal brachytherapy boost 1200 cGy in 3 fractions of high-dose rate remote afterloading using a vaginal cylinder. Risks and benefits of treatment including increased lower urinary tract symptoms diarrhea fatigue alteration of blood counts skin reaction and some possible vaginal stenosis all were discussed in detail with the patient and her family. They all seem to comprehend my treatment plan  well. I personally 7 ordered CT simulation for early next week. I have discussed the case with GYN oncology and her case will be presented at our monthly GYN tumor conference.  I would like to take this opportunity to thank you for allowing me to participate in the care of your patient.Marland Kitchen

## 2017-12-26 ENCOUNTER — Ambulatory Visit
Admission: RE | Admit: 2017-12-26 | Discharge: 2017-12-26 | Disposition: A | Discharge status: Home / Self Care | Payer: Medicare Other | Source: Ambulatory Visit | Attending: Radiation Oncology | Admitting: Radiation Oncology

## 2017-12-26 DIAGNOSIS — C541 Malignant neoplasm of endometrium: Secondary | ICD-10-CM | POA: Diagnosis not present

## 2017-12-26 DIAGNOSIS — Z510 Encounter for antineoplastic radiation therapy: Secondary | ICD-10-CM | POA: Insufficient documentation

## 2017-12-27 DIAGNOSIS — Z510 Encounter for antineoplastic radiation therapy: Secondary | ICD-10-CM | POA: Diagnosis not present

## 2017-12-29 ENCOUNTER — Other Ambulatory Visit: Payer: Self-pay | Admitting: *Deleted

## 2017-12-29 DIAGNOSIS — C541 Malignant neoplasm of endometrium: Secondary | ICD-10-CM

## 2018-01-02 ENCOUNTER — Ambulatory Visit
Admission: RE | Admit: 2018-01-02 | Discharge: 2018-01-02 | Disposition: A | Discharge status: Home / Self Care | Payer: Medicare Other | Source: Ambulatory Visit | Attending: Radiation Oncology | Admitting: Radiation Oncology

## 2018-01-02 DIAGNOSIS — Z510 Encounter for antineoplastic radiation therapy: Secondary | ICD-10-CM | POA: Diagnosis not present

## 2018-01-03 ENCOUNTER — Ambulatory Visit
Admission: RE | Admit: 2018-01-03 | Discharge: 2018-01-03 | Disposition: A | Discharge status: Home / Self Care | Payer: Medicare Other | Source: Ambulatory Visit | Attending: Radiation Oncology | Admitting: Radiation Oncology

## 2018-01-03 DIAGNOSIS — Z510 Encounter for antineoplastic radiation therapy: Secondary | ICD-10-CM | POA: Diagnosis not present

## 2018-01-04 ENCOUNTER — Inpatient Hospital Stay: Payer: Medicare Other

## 2018-01-04 ENCOUNTER — Ambulatory Visit
Admission: RE | Admit: 2018-01-04 | Discharge: 2018-01-04 | Disposition: A | Discharge status: Home / Self Care | Payer: Medicare Other | Source: Ambulatory Visit | Attending: Radiation Oncology | Admitting: Radiation Oncology

## 2018-01-04 DIAGNOSIS — Z510 Encounter for antineoplastic radiation therapy: Secondary | ICD-10-CM | POA: Diagnosis not present

## 2018-01-05 ENCOUNTER — Ambulatory Visit
Admission: RE | Admit: 2018-01-05 | Discharge: 2018-01-05 | Disposition: A | Discharge status: Home / Self Care | Payer: Medicare Other | Source: Ambulatory Visit | Attending: Radiation Oncology | Admitting: Radiation Oncology

## 2018-01-05 DIAGNOSIS — Z510 Encounter for antineoplastic radiation therapy: Secondary | ICD-10-CM | POA: Diagnosis not present

## 2018-01-06 ENCOUNTER — Other Ambulatory Visit: Payer: Self-pay | Admitting: Radiation Oncology

## 2018-01-06 ENCOUNTER — Ambulatory Visit
Admission: RE | Admit: 2018-01-06 | Discharge: 2018-01-06 | Disposition: A | Discharge status: Home / Self Care | Payer: Medicare Other | Source: Ambulatory Visit | Attending: Radiation Oncology | Admitting: Radiation Oncology

## 2018-01-06 ENCOUNTER — Other Ambulatory Visit: Payer: Self-pay | Admitting: *Deleted

## 2018-01-06 DIAGNOSIS — Z510 Encounter for antineoplastic radiation therapy: Secondary | ICD-10-CM | POA: Diagnosis not present

## 2018-01-06 MED ORDER — PROCHLORPERAZINE MALEATE 10 MG PO TABS: 10.0000 mg | ORAL_TABLET | Freq: Four times a day (QID) | ORAL | 0 refills | Status: DC | PRN

## 2018-01-09 ENCOUNTER — Ambulatory Visit
Admission: RE | Admit: 2018-01-09 | Discharge: 2018-01-09 | Disposition: A | Discharge status: Home / Self Care | Payer: Medicare Other | Source: Ambulatory Visit | Attending: Radiation Oncology | Admitting: Radiation Oncology

## 2018-01-09 DIAGNOSIS — Z510 Encounter for antineoplastic radiation therapy: Secondary | ICD-10-CM | POA: Diagnosis not present

## 2018-01-10 ENCOUNTER — Ambulatory Visit
Admission: RE | Admit: 2018-01-10 | Discharge: 2018-01-10 | Disposition: A | Discharge status: Home / Self Care | Payer: Medicare Other | Source: Ambulatory Visit | Attending: Radiation Oncology | Admitting: Radiation Oncology

## 2018-01-10 ENCOUNTER — Other Ambulatory Visit: Payer: Self-pay | Admitting: Radiation Oncology

## 2018-01-10 DIAGNOSIS — Z510 Encounter for antineoplastic radiation therapy: Secondary | ICD-10-CM | POA: Diagnosis not present

## 2018-01-11 ENCOUNTER — Ambulatory Visit
Admission: RE | Admit: 2018-01-11 | Discharge: 2018-01-11 | Disposition: A | Discharge status: Home / Self Care | Payer: Medicare Other | Source: Ambulatory Visit | Attending: Radiation Oncology | Admitting: Radiation Oncology

## 2018-01-11 DIAGNOSIS — Z510 Encounter for antineoplastic radiation therapy: Secondary | ICD-10-CM | POA: Diagnosis not present

## 2018-01-12 ENCOUNTER — Ambulatory Visit
Admission: RE | Admit: 2018-01-12 | Discharge: 2018-01-12 | Disposition: A | Discharge status: Home / Self Care | Payer: Medicare Other | Source: Ambulatory Visit | Attending: Radiation Oncology | Admitting: Radiation Oncology

## 2018-01-12 ENCOUNTER — Inpatient Hospital Stay: Payer: Medicare Other

## 2018-01-12 DIAGNOSIS — C541 Malignant neoplasm of endometrium: Secondary | ICD-10-CM | POA: Diagnosis not present

## 2018-01-12 DIAGNOSIS — Z510 Encounter for antineoplastic radiation therapy: Secondary | ICD-10-CM | POA: Diagnosis not present

## 2018-01-12 LAB — CBC
HCT: 39.4 % (ref 35.0–47.0)
Hemoglobin: 13.6 g/dL (ref 12.0–16.0)
MCH: 30.7 pg (ref 26.0–34.0)
MCHC: 34.5 g/dL (ref 32.0–36.0)
MCV: 89.2 fL (ref 80.0–100.0)
Platelets: 309 10*3/uL (ref 150–440)
RBC: 4.42 MIL/uL (ref 3.80–5.20)
RDW: 13.9 % (ref 11.5–14.5)
WBC: 3.7 10*3/uL (ref 3.6–11.0)

## 2018-01-13 ENCOUNTER — Ambulatory Visit
Admission: RE | Admit: 2018-01-13 | Discharge: 2018-01-13 | Disposition: A | Discharge status: Home / Self Care | Payer: Medicare Other | Source: Ambulatory Visit | Attending: Radiation Oncology | Admitting: Radiation Oncology

## 2018-01-13 DIAGNOSIS — Z510 Encounter for antineoplastic radiation therapy: Secondary | ICD-10-CM | POA: Diagnosis not present

## 2018-01-14 ENCOUNTER — Other Ambulatory Visit: Payer: Self-pay | Admitting: Radiation Oncology

## 2018-01-16 ENCOUNTER — Ambulatory Visit
Admission: RE | Admit: 2018-01-16 | Discharge: 2018-01-16 | Disposition: A | Discharge status: Home / Self Care | Payer: Medicare Other | Source: Ambulatory Visit | Attending: Radiation Oncology | Admitting: Radiation Oncology

## 2018-01-16 DIAGNOSIS — Z510 Encounter for antineoplastic radiation therapy: Secondary | ICD-10-CM | POA: Diagnosis present

## 2018-01-16 DIAGNOSIS — C541 Malignant neoplasm of endometrium: Secondary | ICD-10-CM | POA: Diagnosis not present

## 2018-01-17 ENCOUNTER — Ambulatory Visit
Admission: RE | Admit: 2018-01-17 | Discharge: 2018-01-17 | Disposition: A | Discharge status: Home / Self Care | Payer: Medicare Other | Source: Ambulatory Visit | Attending: Radiation Oncology | Admitting: Radiation Oncology

## 2018-01-17 ENCOUNTER — Other Ambulatory Visit: Payer: Self-pay | Admitting: Family Medicine

## 2018-01-17 DIAGNOSIS — Z1239 Encounter for other screening for malignant neoplasm of breast: Secondary | ICD-10-CM

## 2018-01-17 DIAGNOSIS — Z510 Encounter for antineoplastic radiation therapy: Secondary | ICD-10-CM | POA: Diagnosis not present

## 2018-01-18 ENCOUNTER — Encounter: Payer: Self-pay | Admitting: Obstetrics and Gynecology

## 2018-01-18 ENCOUNTER — Inpatient Hospital Stay: Payer: Medicare Other | Attending: Obstetrics and Gynecology | Admitting: Obstetrics and Gynecology

## 2018-01-18 ENCOUNTER — Ambulatory Visit
Admission: RE | Admit: 2018-01-18 | Discharge: 2018-01-18 | Disposition: A | Discharge status: Home / Self Care | Payer: Medicare Other | Source: Ambulatory Visit | Attending: Radiation Oncology | Admitting: Radiation Oncology

## 2018-01-18 VITALS — BP 116/74 | HR 83 | Temp 97.8°F | Resp 198 | Ht 62.0 in | Wt 209.8 lb

## 2018-01-18 DIAGNOSIS — Z90710 Acquired absence of both cervix and uterus: Secondary | ICD-10-CM | POA: Diagnosis not present

## 2018-01-18 DIAGNOSIS — C541 Malignant neoplasm of endometrium: Secondary | ICD-10-CM | POA: Diagnosis not present

## 2018-01-18 DIAGNOSIS — I200 Unstable angina: Secondary | ICD-10-CM

## 2018-01-18 DIAGNOSIS — Z90722 Acquired absence of ovaries, bilateral: Secondary | ICD-10-CM | POA: Insufficient documentation

## 2018-01-18 DIAGNOSIS — Z510 Encounter for antineoplastic radiation therapy: Secondary | ICD-10-CM | POA: Diagnosis not present

## 2018-01-18 NOTE — Progress Notes (Signed)
Patient c/o diarrhea from radiation, and also lower back pain(4) with leg swelling, and feeling sad.
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
## 2018-01-19 ENCOUNTER — Inpatient Hospital Stay: Payer: Medicare Other

## 2018-01-19 ENCOUNTER — Ambulatory Visit
Admission: RE | Admit: 2018-01-19 | Discharge: 2018-01-19 | Disposition: A | Discharge status: Home / Self Care | Payer: Medicare Other | Source: Ambulatory Visit | Attending: Radiation Oncology | Admitting: Radiation Oncology

## 2018-01-19 DIAGNOSIS — Z510 Encounter for antineoplastic radiation therapy: Secondary | ICD-10-CM | POA: Diagnosis not present

## 2018-01-19 DIAGNOSIS — C541 Malignant neoplasm of endometrium: Secondary | ICD-10-CM

## 2018-01-19 LAB — CBC
HEMATOCRIT: 39.8 % (ref 35.0–47.0)
Hemoglobin: 13.7 g/dL (ref 12.0–16.0)
MCH: 30.7 pg (ref 26.0–34.0)
MCHC: 34.4 g/dL (ref 32.0–36.0)
MCV: 89.2 fL (ref 80.0–100.0)
Platelets: 248 10*3/uL (ref 150–440)
RBC: 4.46 MIL/uL (ref 3.80–5.20)
RDW: 13.6 % (ref 11.5–14.5)
WBC: 4.4 10*3/uL (ref 3.6–11.0)

## 2018-01-20 ENCOUNTER — Ambulatory Visit
Admission: RE | Admit: 2018-01-20 | Discharge: 2018-01-20 | Disposition: A | Discharge status: Home / Self Care | Payer: Medicare Other | Source: Ambulatory Visit | Attending: Radiation Oncology | Admitting: Radiation Oncology

## 2018-01-20 DIAGNOSIS — Z510 Encounter for antineoplastic radiation therapy: Secondary | ICD-10-CM | POA: Diagnosis not present

## 2018-01-20 DIAGNOSIS — Z86711 Personal history of pulmonary embolism: Secondary | ICD-10-CM | POA: Insufficient documentation

## 2018-01-23 ENCOUNTER — Ambulatory Visit
Admission: RE | Admit: 2018-01-23 | Discharge: 2018-01-23 | Disposition: A | Discharge status: Home / Self Care | Payer: Medicare Other | Source: Ambulatory Visit | Attending: Radiation Oncology | Admitting: Radiation Oncology

## 2018-01-23 DIAGNOSIS — Z510 Encounter for antineoplastic radiation therapy: Secondary | ICD-10-CM | POA: Diagnosis not present

## 2018-01-24 ENCOUNTER — Ambulatory Visit
Admission: RE | Admit: 2018-01-24 | Discharge: 2018-01-24 | Disposition: A | Discharge status: Home / Self Care | Payer: Medicare Other | Source: Ambulatory Visit | Attending: Radiation Oncology | Admitting: Radiation Oncology

## 2018-01-24 DIAGNOSIS — Z510 Encounter for antineoplastic radiation therapy: Secondary | ICD-10-CM | POA: Diagnosis not present

## 2018-01-25 ENCOUNTER — Ambulatory Visit
Admission: RE | Admit: 2018-01-25 | Discharge: 2018-01-25 | Disposition: A | Discharge status: Home / Self Care | Payer: Medicare Other | Source: Ambulatory Visit | Attending: Radiation Oncology | Admitting: Radiation Oncology

## 2018-01-25 DIAGNOSIS — Z510 Encounter for antineoplastic radiation therapy: Secondary | ICD-10-CM | POA: Diagnosis not present

## 2018-01-26 ENCOUNTER — Inpatient Hospital Stay: Payer: Medicare Other

## 2018-01-26 ENCOUNTER — Ambulatory Visit
Admission: RE | Admit: 2018-01-26 | Discharge: 2018-01-26 | Disposition: A | Discharge status: Home / Self Care | Payer: Medicare Other | Source: Ambulatory Visit | Attending: Radiation Oncology | Admitting: Radiation Oncology

## 2018-01-26 DIAGNOSIS — Z510 Encounter for antineoplastic radiation therapy: Secondary | ICD-10-CM | POA: Diagnosis not present

## 2018-01-27 ENCOUNTER — Ambulatory Visit
Admission: RE | Admit: 2018-01-27 | Discharge: 2018-01-27 | Disposition: A | Discharge status: Home / Self Care | Payer: Medicare Other | Source: Ambulatory Visit | Attending: Radiation Oncology | Admitting: Radiation Oncology

## 2018-01-27 DIAGNOSIS — Z510 Encounter for antineoplastic radiation therapy: Secondary | ICD-10-CM | POA: Diagnosis not present

## 2018-01-30 ENCOUNTER — Ambulatory Visit
Admission: RE | Admit: 2018-01-30 | Discharge: 2018-01-30 | Disposition: A | Discharge status: Home / Self Care | Payer: Medicare Other | Source: Ambulatory Visit | Attending: Radiation Oncology | Admitting: Radiation Oncology

## 2018-01-30 DIAGNOSIS — Z510 Encounter for antineoplastic radiation therapy: Secondary | ICD-10-CM | POA: Diagnosis not present

## 2018-01-31 ENCOUNTER — Ambulatory Visit
Admission: RE | Admit: 2018-01-31 | Discharge: 2018-01-31 | Disposition: A | Discharge status: Home / Self Care | Payer: Medicare Other | Source: Ambulatory Visit | Attending: Radiation Oncology | Admitting: Radiation Oncology

## 2018-01-31 DIAGNOSIS — Z510 Encounter for antineoplastic radiation therapy: Secondary | ICD-10-CM | POA: Diagnosis not present

## 2018-02-01 ENCOUNTER — Ambulatory Visit
Admission: RE | Admit: 2018-02-01 | Discharge: 2018-02-01 | Disposition: A | Discharge status: Home / Self Care | Payer: Medicare Other | Source: Ambulatory Visit | Attending: Radiation Oncology | Admitting: Radiation Oncology

## 2018-02-01 DIAGNOSIS — Z510 Encounter for antineoplastic radiation therapy: Secondary | ICD-10-CM | POA: Diagnosis not present

## 2018-02-02 ENCOUNTER — Other Ambulatory Visit: Payer: Self-pay

## 2018-02-02 ENCOUNTER — Ambulatory Visit
Admission: RE | Admit: 2018-02-02 | Discharge: 2018-02-02 | Disposition: A | Discharge status: Home / Self Care | Payer: Medicare Other | Source: Ambulatory Visit | Attending: Radiation Oncology | Admitting: Radiation Oncology

## 2018-02-02 ENCOUNTER — Inpatient Hospital Stay: Payer: Medicare Other

## 2018-02-02 DIAGNOSIS — Z510 Encounter for antineoplastic radiation therapy: Secondary | ICD-10-CM | POA: Diagnosis not present

## 2018-02-02 DIAGNOSIS — C541 Malignant neoplasm of endometrium: Secondary | ICD-10-CM

## 2018-02-02 LAB — CBC
HCT: 37.4 % (ref 35.0–47.0)
HEMOGLOBIN: 13.2 g/dL (ref 12.0–16.0)
MCH: 31.1 pg (ref 26.0–34.0)
MCHC: 35.3 g/dL (ref 32.0–36.0)
MCV: 87.9 fL (ref 80.0–100.0)
PLATELETS: 370 10*3/uL (ref 150–440)
RBC: 4.26 MIL/uL (ref 3.80–5.20)
RDW: 14.2 % (ref 11.5–14.5)
WBC: 4.4 10*3/uL (ref 3.6–11.0)

## 2018-02-03 ENCOUNTER — Ambulatory Visit
Admission: RE | Admit: 2018-02-03 | Discharge: 2018-02-03 | Disposition: A | Discharge status: Home / Self Care | Payer: Medicare Other | Source: Ambulatory Visit | Attending: Radiation Oncology | Admitting: Radiation Oncology

## 2018-02-03 DIAGNOSIS — Z510 Encounter for antineoplastic radiation therapy: Secondary | ICD-10-CM | POA: Diagnosis not present

## 2018-02-06 ENCOUNTER — Ambulatory Visit
Admission: RE | Admit: 2018-02-06 | Discharge: 2018-02-06 | Disposition: A | Discharge status: Home / Self Care | Payer: Medicare Other | Source: Ambulatory Visit | Attending: Radiation Oncology | Admitting: Radiation Oncology

## 2018-02-06 DIAGNOSIS — Z510 Encounter for antineoplastic radiation therapy: Secondary | ICD-10-CM | POA: Diagnosis not present

## 2018-02-07 DIAGNOSIS — Z510 Encounter for antineoplastic radiation therapy: Secondary | ICD-10-CM | POA: Diagnosis not present

## 2018-02-15 ENCOUNTER — Ambulatory Visit
Admission: RE | Admit: 2018-02-15 | Discharge: 2018-02-15 | Disposition: A | Discharge status: Home / Self Care | Payer: Medicare Other | Source: Ambulatory Visit | Attending: Family Medicine | Admitting: Family Medicine

## 2018-02-15 ENCOUNTER — Ambulatory Visit
Admission: RE | Admit: 2018-02-15 | Discharge: 2018-02-15 | Disposition: A | Discharge status: Home / Self Care | Payer: Medicare Other | Source: Ambulatory Visit | Attending: Radiation Oncology | Admitting: Radiation Oncology

## 2018-02-15 DIAGNOSIS — C541 Malignant neoplasm of endometrium: Secondary | ICD-10-CM | POA: Diagnosis not present

## 2018-02-15 DIAGNOSIS — Z1231 Encounter for screening mammogram for malignant neoplasm of breast: Secondary | ICD-10-CM | POA: Diagnosis present

## 2018-02-15 DIAGNOSIS — Z510 Encounter for antineoplastic radiation therapy: Secondary | ICD-10-CM | POA: Diagnosis not present

## 2018-02-15 DIAGNOSIS — Z1239 Encounter for other screening for malignant neoplasm of breast: Secondary | ICD-10-CM

## 2018-02-15 HISTORY — DX: Personal history of irradiation: Z92.3

## 2018-02-15 HISTORY — DX: Malignant (primary) neoplasm, unspecified: C80.1

## 2018-02-20 ENCOUNTER — Ambulatory Visit
Admission: RE | Admit: 2018-02-20 | Discharge: 2018-02-20 | Disposition: A | Discharge status: Home / Self Care | Payer: Medicare Other | Source: Ambulatory Visit | Attending: Radiation Oncology | Admitting: Radiation Oncology

## 2018-02-20 DIAGNOSIS — Z510 Encounter for antineoplastic radiation therapy: Secondary | ICD-10-CM | POA: Diagnosis not present

## 2018-02-23 ENCOUNTER — Ambulatory Visit
Admission: RE | Admit: 2018-02-23 | Discharge: 2018-02-23 | Disposition: A | Discharge status: Home / Self Care | Payer: Medicare Other | Source: Ambulatory Visit | Attending: Radiation Oncology | Admitting: Radiation Oncology

## 2018-02-23 DIAGNOSIS — Z510 Encounter for antineoplastic radiation therapy: Secondary | ICD-10-CM | POA: Diagnosis not present

## 2018-02-27 ENCOUNTER — Ambulatory Visit
Admission: RE | Admit: 2018-02-27 | Discharge: 2018-02-27 | Disposition: A | Discharge status: Home / Self Care | Payer: Medicare Other | Source: Ambulatory Visit | Attending: Radiation Oncology | Admitting: Radiation Oncology

## 2018-02-27 DIAGNOSIS — Z510 Encounter for antineoplastic radiation therapy: Secondary | ICD-10-CM | POA: Diagnosis not present

## 2018-04-07 ENCOUNTER — Other Ambulatory Visit: Payer: Self-pay

## 2018-04-07 ENCOUNTER — Ambulatory Visit
Admission: RE | Admit: 2018-04-07 | Discharge: 2018-04-07 | Disposition: A | Discharge status: Home / Self Care | Payer: Medicare Other | Source: Ambulatory Visit | Attending: Radiation Oncology | Admitting: Radiation Oncology

## 2018-04-07 ENCOUNTER — Encounter: Payer: Self-pay | Admitting: Radiation Oncology

## 2018-04-07 VITALS — BP 120/77 | HR 67 | Temp 97.8°F | Resp 18 | Wt 202.4 lb

## 2018-04-07 DIAGNOSIS — Z08 Encounter for follow-up examination after completed treatment for malignant neoplasm: Secondary | ICD-10-CM | POA: Diagnosis not present

## 2018-04-07 DIAGNOSIS — C541 Malignant neoplasm of endometrium: Secondary | ICD-10-CM | POA: Diagnosis present

## 2018-04-07 DIAGNOSIS — Z8542 Personal history of malignant neoplasm of other parts of uterus: Secondary | ICD-10-CM | POA: Diagnosis not present

## 2018-04-07 NOTE — Progress Notes (Signed)
Radiation Oncology Follow up Note  Name: Mary Meyers   Date:   04/07/2018 MRN:  [id number] DOB: 1945/12/29    This 72 y.o. female presents to the clinic today for one-month follow-up status post radiation therapy for stage I high-grade endometrial carcinoma status post TAH/BSO and lymph node sampling.  REFERRING PROVIDER: Juluis Pitch, MD  HPI: patient is a 72 year old female now out 1 month having completed external beam radiation therapy as well as vaginal cuff brachytherapy for high-grade.endometrial carcinoma.She is seen today in routine follow-up she is doing well she specifically denies diarrhea dysuria or any other lower urinary tract symptoms or vaginal discharge.  COMPLICATIONS OF TREATMENT: none  FOLLOW UP COMPLIANCE: keeps appointments   PHYSICAL EXAM:  BP 120/77   Pulse 67   Temp 97.8 F (36.6 C)   Resp 18   Wt 202 lb 6.1 oz (91.8 kg)   BMI 37.02 kg/m  On speculum examination vaginal vault is clear no evidence of vaginal fibrosis or vaginal mucosal mass or nodularity. Bimanual examination shows no evidence of parametrial mass. Exam is within normal limits. Well-developed well-nourished patient in NAD. HEENT reveals PERLA, EOMI, discs not visualized.  Oral cavity is clear. No oral mucosal lesions are identified. Neck is clear without evidence of cervical or supraclavicular adenopathy. Lungs are clear to A&P. Cardiac examination is essentially unremarkable with regular rate and rhythm without murmur rub or thrill. Abdomen is benign with no organomegaly or masses noted. Motor sensory and DTR levels are equal and symmetric in the upper and lower extremities. Cranial nerves II through XII are grossly intact. Proprioception is intact. No peripheral adenopathy or edema is identified. No motor or sensory levels are noted. Crude visual fields are within normal range.  RADIOLOGY RESULTS: no current films for review  PLAN: at the present time patient has done well with  minimal side effects. I'm please were overall progress. I've asked to see her out in 4-5 months for follow-up. She continues close follow-up care with GYN oncology. Patient knows to call with any concerns.  I would like to take this opportunity to thank you for allowing me to participate in the care of your patient.Noreene Filbert, MD

## 2018-04-19 ENCOUNTER — Ambulatory Visit: Payer: Medicare Other

## 2018-05-03 ENCOUNTER — Telehealth: Payer: Self-pay | Admitting: *Deleted

## 2018-05-03 ENCOUNTER — Ambulatory Visit: Payer: Medicare Other

## 2018-05-03 ENCOUNTER — Inpatient Hospital Stay: Payer: Medicare Other | Attending: Nurse Practitioner | Admitting: Nurse Practitioner

## 2018-05-03 VITALS — BP 150/79 | HR 72 | Temp 97.8°F | Resp 20 | Wt 202.0 lb

## 2018-05-03 DIAGNOSIS — I700 Atherosclerosis of aorta: Secondary | ICD-10-CM | POA: Diagnosis not present

## 2018-05-03 DIAGNOSIS — Z806 Family history of leukemia: Secondary | ICD-10-CM | POA: Insufficient documentation

## 2018-05-03 DIAGNOSIS — K2100 Gastro-esophageal reflux disease with esophagitis, without bleeding: Secondary | ICD-10-CM

## 2018-05-03 DIAGNOSIS — Z90710 Acquired absence of both cervix and uterus: Secondary | ICD-10-CM

## 2018-05-03 DIAGNOSIS — I10 Essential (primary) hypertension: Secondary | ICD-10-CM | POA: Diagnosis not present

## 2018-05-03 DIAGNOSIS — M129 Arthropathy, unspecified: Secondary | ICD-10-CM

## 2018-05-03 DIAGNOSIS — K219 Gastro-esophageal reflux disease without esophagitis: Secondary | ICD-10-CM | POA: Insufficient documentation

## 2018-05-03 DIAGNOSIS — E039 Hypothyroidism, unspecified: Secondary | ICD-10-CM | POA: Diagnosis not present

## 2018-05-03 DIAGNOSIS — Z86711 Personal history of pulmonary embolism: Secondary | ICD-10-CM | POA: Diagnosis not present

## 2018-05-03 DIAGNOSIS — Z87891 Personal history of nicotine dependence: Secondary | ICD-10-CM | POA: Insufficient documentation

## 2018-05-03 DIAGNOSIS — Z90722 Acquired absence of ovaries, bilateral: Secondary | ICD-10-CM | POA: Diagnosis not present

## 2018-05-03 DIAGNOSIS — Z8042 Family history of malignant neoplasm of prostate: Secondary | ICD-10-CM | POA: Insufficient documentation

## 2018-05-03 DIAGNOSIS — Z79899 Other long term (current) drug therapy: Secondary | ICD-10-CM | POA: Diagnosis not present

## 2018-05-03 DIAGNOSIS — Z803 Family history of malignant neoplasm of breast: Secondary | ICD-10-CM | POA: Diagnosis not present

## 2018-05-03 DIAGNOSIS — F418 Other specified anxiety disorders: Secondary | ICD-10-CM | POA: Insufficient documentation

## 2018-05-03 DIAGNOSIS — K5730 Diverticulosis of large intestine without perforation or abscess without bleeding: Secondary | ICD-10-CM | POA: Insufficient documentation

## 2018-05-03 DIAGNOSIS — E785 Hyperlipidemia, unspecified: Secondary | ICD-10-CM | POA: Insufficient documentation

## 2018-05-03 DIAGNOSIS — I2510 Atherosclerotic heart disease of native coronary artery without angina pectoris: Secondary | ICD-10-CM | POA: Insufficient documentation

## 2018-05-03 DIAGNOSIS — C541 Malignant neoplasm of endometrium: Secondary | ICD-10-CM | POA: Insufficient documentation

## 2018-05-03 DIAGNOSIS — E669 Obesity, unspecified: Secondary | ICD-10-CM | POA: Diagnosis not present
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
## 2018-05-03 NOTE — Telephone Encounter (Signed)
Patient called. Left a vm on Dr. Aletha Halim clinical team vm. Requesting call back regarding her apts and r/s her apt times.

## 2018-05-04 ENCOUNTER — Encounter: Payer: Self-pay | Admitting: Nurse Practitioner

## 2018-05-18 ENCOUNTER — Other Ambulatory Visit: Payer: Self-pay

## 2018-05-18 ENCOUNTER — Other Ambulatory Visit
Admission: RE | Admit: 2018-05-18 | Discharge: 2018-05-18 | Disposition: A | Discharge status: Home / Self Care | Payer: Medicare Other | Source: Ambulatory Visit | Attending: Gastroenterology | Admitting: Gastroenterology

## 2018-05-18 ENCOUNTER — Encounter: Payer: Self-pay | Admitting: Gastroenterology

## 2018-05-18 ENCOUNTER — Ambulatory Visit (INDEPENDENT_AMBULATORY_CARE_PROVIDER_SITE_OTHER): Payer: Medicare Other | Admitting: Gastroenterology

## 2018-05-18 ENCOUNTER — Ambulatory Visit: Payer: Medicare Other | Admitting: Gastroenterology

## 2018-05-18 VITALS — BP 132/74 | HR 70 | Resp 17 | Ht 62.0 in | Wt 205.3 lb

## 2018-05-18 DIAGNOSIS — I200 Unstable angina: Secondary | ICD-10-CM

## 2018-05-18 DIAGNOSIS — K219 Gastro-esophageal reflux disease without esophagitis: Secondary | ICD-10-CM
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
## 2018-05-18 NOTE — Patient Instructions (Signed)
Barrett Esophagus  Barrett esophagus occurs when the tissue that lines the esophagus changes or becomes damaged. The esophagus is the tube that carries food from the throat to the stomach. With Barrett esophagus, the cells that line the esophagus are replaced by cells that are similar to the lining of the intestines (intestinal metaplasia).  Barrett esophagus itself may not cause any symptoms. However, many people who have Barrett esophagus also have gastroesophageal reflux disease (GERD), which may cause symptoms such as heartburn. Treatment may include medicines, procedures to destroy the abnormal cells, or surgery. Over time, a few people with this condition may develop cancer of the esophagus.  What are the causes?  The exact cause of this condition is not known. In some cases, the condition develops from damage to the lining of the esophagus caused by GERD. GERD occurs when stomach acids flow up from the stomach into the esophagus. Frequent symptoms of GERD may cause intestinal metaplasia or cause cell changes (dysplasia).  What increases the risk?  The following factors may make you more likely to develop this condition:   Having GERD.   Being any of the following:  ? Female.  ? White (Caucasian).  ? Obese.  ? Older than 50.   Having a hiatal hernia.   Smoking.    What are the signs or symptoms?  People with Barrett esophagus often have no symptoms. However, many people with this condition also have GERD. Symptoms of GERD may include:   Heartburn.   Difficulty swallowing.   Dry cough.    How is this diagnosed?  Barrett esophagus may be diagnosed with an exam called an upper gastrointestinal endoscopy. During this exam, a thin, flexible tube (endoscope) is passed down your esophagus. The endoscope has a light and camera on the end of it. Your health care provider uses the endoscope to view the inside of your esophagus. During the exam, several tissue samples will be removed (biopsy) from your esophagus so  they can be checked for intestinal metaplasia or dysplasia.  How is this treated?  Treatment for this condition may include:   Medicines (proton pump inhibitors, or PPIs) to decrease or stop GERD.   Periodic endoscopic exams to make sure that cancer is not developing.   A procedure or surgery for dysplasia. This may include:  ? Endoscopic removal or destruction of abnormal cells.  ? Removal of part of the esophagus (esophagectomy).    Follow these instructions at home:  Eating and drinking   Eat more fruits and vegetables.   Avoid fatty foods.   Eat small, frequent meals instead of large meals.   Avoid foods that cause heartburn. These foods include:  ? Coffee and alcoholic drinks.  ? Tomatoes and foods made with tomatoes.  ? Greasy or spicy foods.  ? Chocolate and peppermint.  General instructions     Take over-the-counter and prescription medicines only as told by your health care provider.   Do not use any tobacco products, such as cigarettes, chewing tobacco, and e-cigarettes. If you need help quitting, ask your health care provider.   If your health care provider is treating you for GERD, make sure you follow all instructions and take medicines as directed.   Keep all follow-up visits as told by your health care provider. This is important.  Contact a health care provider if:   You have heartburn or GERD symptoms.   You have difficulty swallowing.  Get help right away if:   You have chest   pain.   You are unable to swallow.   You vomit blood or material that looks like coffee grounds.   Your stool (feces) is bright red or dark.  This information is not intended to replace advice given to you by your health care provider. Make sure you discuss any questions you have with your health care provider.  Document Released: 12/25/2003 Document Revised: 03/11/2016 Document Reviewed: 07/17/2015  Elsevier Interactive Patient Education  2018 [employer]

## 2018-05-19 ENCOUNTER — Encounter: Payer: Self-pay | Admitting: Gastroenterology

## 2018-05-19 LAB — MISC LABCORP TEST (SEND OUT): LABCORP TEST CODE: 165118

## 2018-05-22 IMAGING — MG MM DIGITAL SCREENING BILAT W/ TOMO W/ CAD
8 series · 8 of 24 positions shown · non-contrast
Comparison: Previous exam(s).

CLINICAL DATA: Screening.

EXAM:
DIGITAL SCREENING BILATERAL MAMMOGRAM WITH TOMO AND CAD

[R MLO synth-2D]
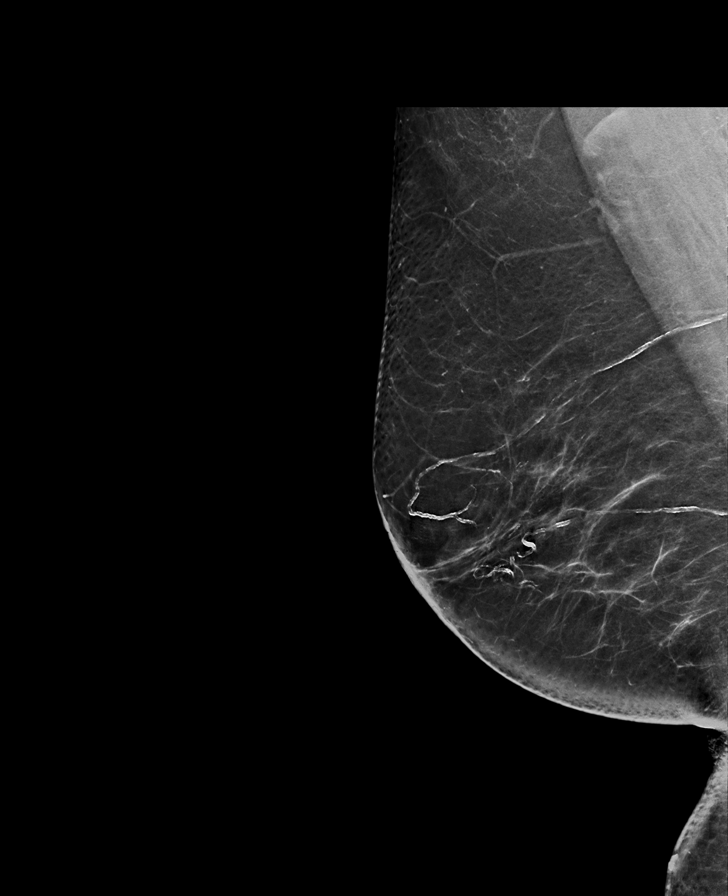

[R CC synth-2D]
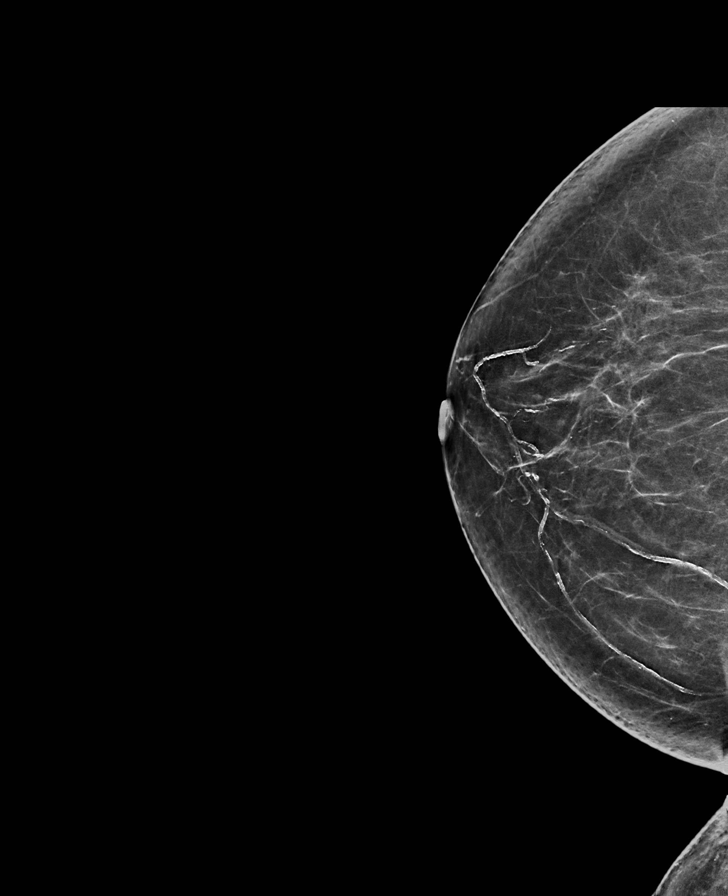

[L MLO synth-2D]
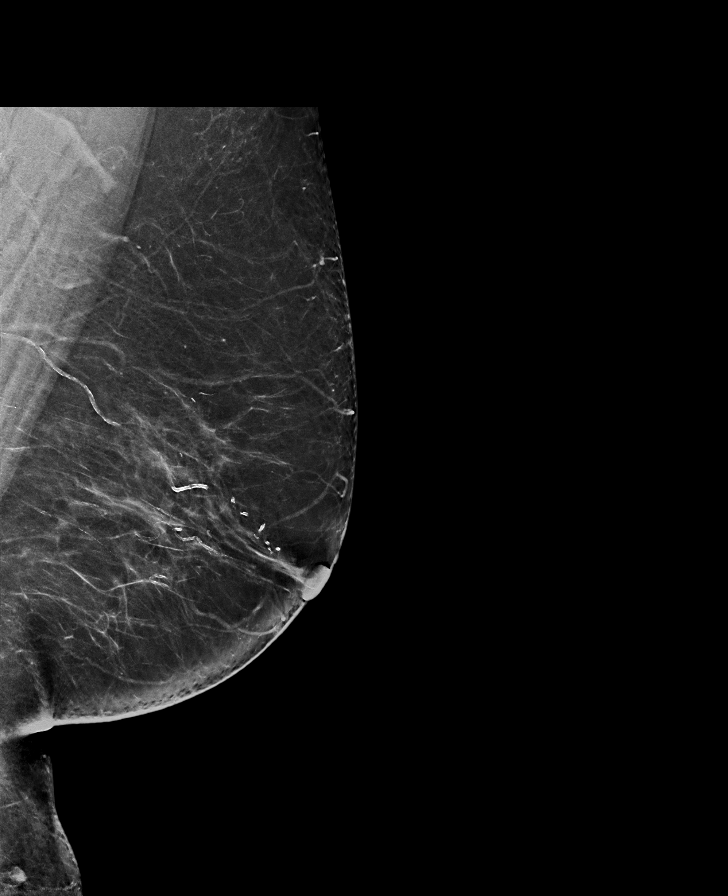

[L CC synth-2D]
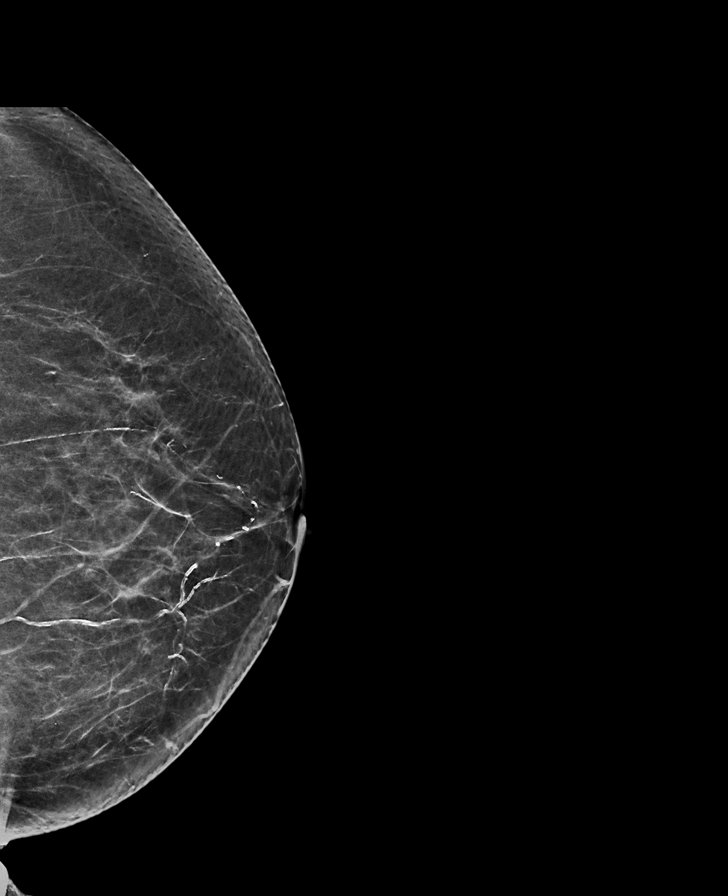

[L CC tomo · tomo slice 33/66.0]
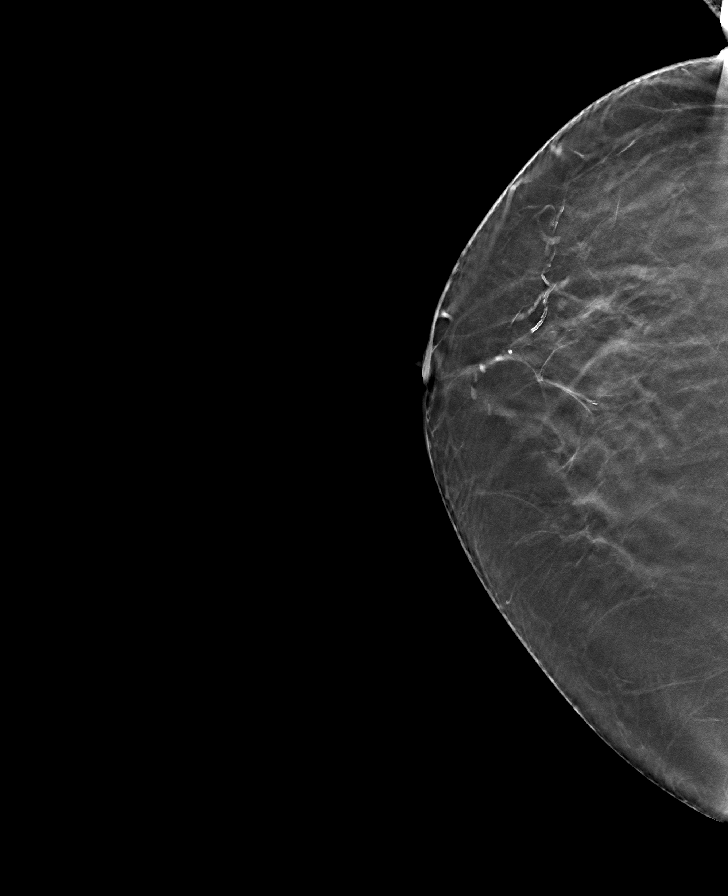

[R CC tomo · tomo slice 36/71.0]
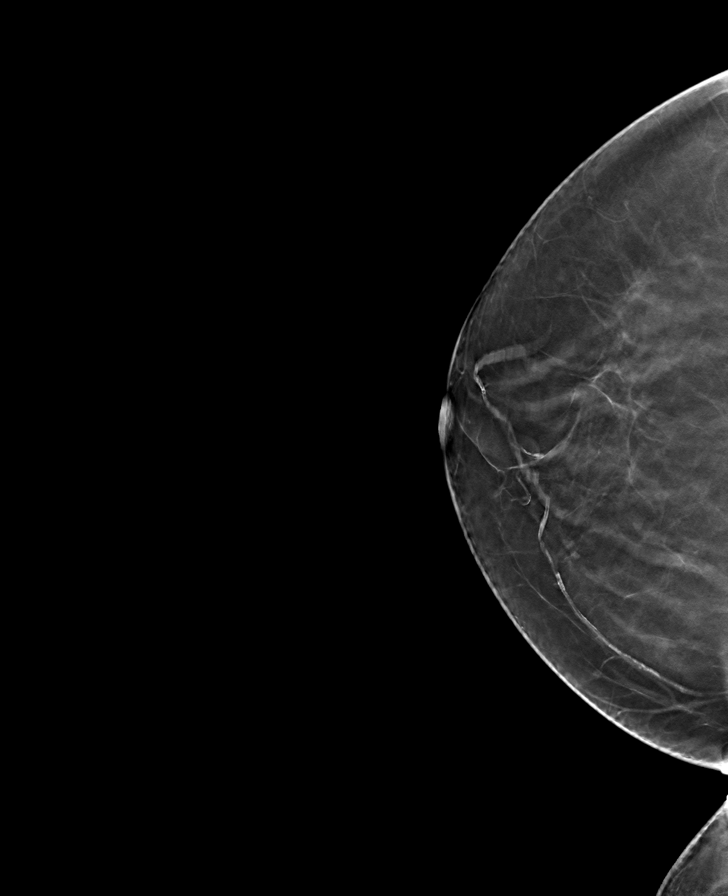

[L MLO tomo · tomo slice 39/78.0]
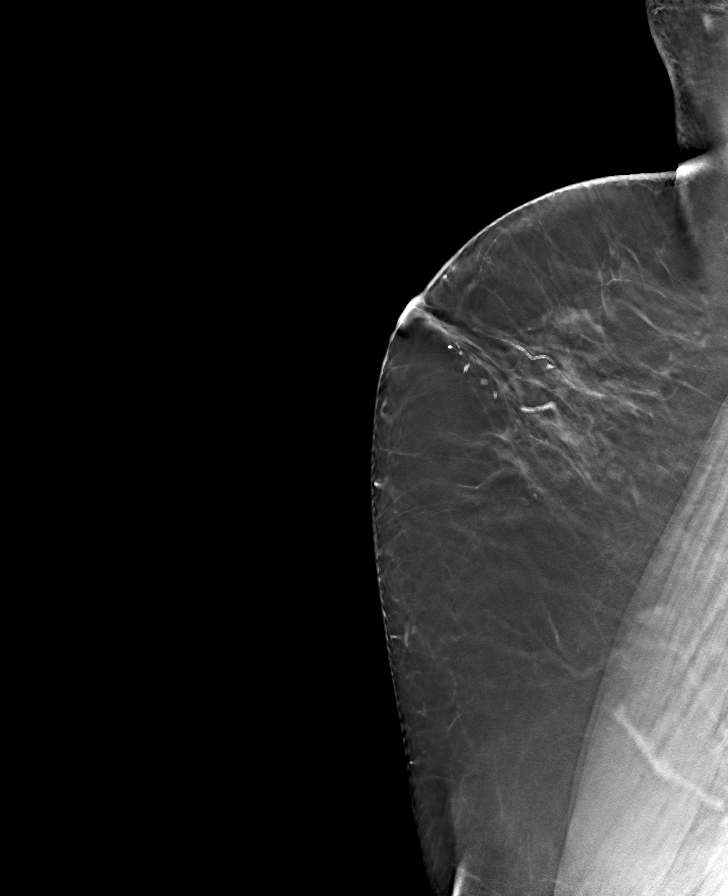

[R MLO tomo · tomo slice 39/77.0]
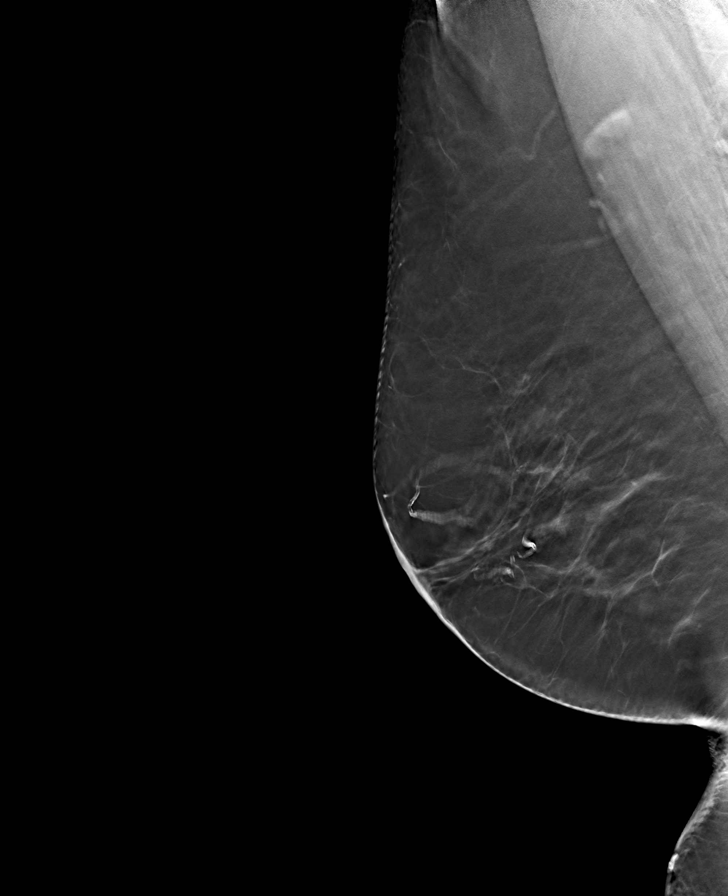

[8 of 24 positions shown; findings below may reference images not displayed]

ACR Breast Density Category b: There are scattered areas of
fibroglandular density.
FINDINGS: There are no findings suspicious for malignancy. Images were
processed with CAD.
IMPRESSION: No mammographic evidence of malignancy. A result letter of this
screening mammogram will be mailed directly to the patient.

RECOMMENDATION:
Screening mammogram in one year. (Code:[id number]-775)

BI-RADS CATEGORY  1: Negative.

## 2018-05-24 ENCOUNTER — Encounter: Payer: Self-pay | Admitting: *Deleted

## 2018-06-07 ENCOUNTER — Ambulatory Visit: Payer: Medicare Other

## 2018-07-19 ENCOUNTER — Ambulatory Visit (INDEPENDENT_AMBULATORY_CARE_PROVIDER_SITE_OTHER): Payer: Medicare Other | Admitting: Gastroenterology

## 2018-07-19 ENCOUNTER — Other Ambulatory Visit: Payer: Self-pay

## 2018-07-19 ENCOUNTER — Encounter: Payer: Self-pay | Admitting: Gastroenterology

## 2018-07-19 VITALS — BP 132/70 | HR 59 | Ht 62.0 in | Wt 204.0 lb

## 2018-07-19 DIAGNOSIS — Z86010 Personal history of colonic polyps: Secondary | ICD-10-CM

## 2018-07-19 DIAGNOSIS — I200 Unstable angina: Secondary | ICD-10-CM | POA: Diagnosis not present

## 2018-07-19 DIAGNOSIS — K219 Gastro-esophageal reflux disease without esophagitis: Secondary | ICD-10-CM

## 2018-07-19 DIAGNOSIS — R140 Abdominal distension (gaseous): Secondary | ICD-10-CM | POA: Diagnosis not present
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
## 2018-08-02 ENCOUNTER — Inpatient Hospital Stay: Payer: Medicare Other

## 2018-08-02 ENCOUNTER — Inpatient Hospital Stay: Payer: Medicare Other | Attending: Obstetrics and Gynecology | Admitting: Obstetrics and Gynecology

## 2018-08-02 ENCOUNTER — Other Ambulatory Visit: Payer: Self-pay | Admitting: Nurse Practitioner

## 2018-08-02 VITALS — BP 129/79 | HR 57 | Temp 97.4°F | Resp 18 | Ht 62.0 in | Wt 205.3 lb

## 2018-08-02 DIAGNOSIS — Z7982 Long term (current) use of aspirin: Secondary | ICD-10-CM | POA: Diagnosis not present

## 2018-08-02 DIAGNOSIS — R61 Generalized hyperhidrosis: Secondary | ICD-10-CM | POA: Diagnosis not present

## 2018-08-02 DIAGNOSIS — C541 Malignant neoplasm of endometrium: Secondary | ICD-10-CM | POA: Diagnosis present

## 2018-08-02 DIAGNOSIS — Z923 Personal history of irradiation: Secondary | ICD-10-CM | POA: Diagnosis not present

## 2018-08-02 DIAGNOSIS — Z87891 Personal history of nicotine dependence: Secondary | ICD-10-CM

## 2018-08-02 DIAGNOSIS — I200 Unstable angina: Secondary | ICD-10-CM

## 2018-08-02 DIAGNOSIS — Z90710 Acquired absence of both cervix and uterus: Secondary | ICD-10-CM

## 2018-08-02 DIAGNOSIS — Z90722 Acquired absence of ovaries, bilateral: Secondary | ICD-10-CM

## 2018-08-02 LAB — TSH: TSH: 6.997 u[IU]/mL — ABNORMAL HIGH (ref 0.350–4.500)

## 2018-08-02 NOTE — Progress Notes (Signed)
Patient does c/o diarrhea, leg swelling, numbness and tingling at times. No Gyn changes noted today, no discharge, no bleeding noted.

## 2018-08-03 ENCOUNTER — Encounter: Payer: Self-pay | Admitting: Nurse Practitioner

## 2018-08-09 ENCOUNTER — Ambulatory Visit
Admission: RE | Admit: 2018-08-09 | Discharge: 2018-08-09 | Disposition: A | Discharge status: Home / Self Care | Payer: Medicare Other | Source: Ambulatory Visit | Attending: Nurse Practitioner | Admitting: Nurse Practitioner

## 2018-08-09 DIAGNOSIS — I700 Atherosclerosis of aorta: Secondary | ICD-10-CM | POA: Insufficient documentation

## 2018-08-09 DIAGNOSIS — K5790 Diverticulosis of intestine, part unspecified, without perforation or abscess without bleeding: Secondary | ICD-10-CM | POA: Insufficient documentation

## 2018-08-09 DIAGNOSIS — C541 Malignant neoplasm of endometrium: Secondary | ICD-10-CM | POA: Insufficient documentation

## 2018-08-09 DIAGNOSIS — K449 Diaphragmatic hernia without obstruction or gangrene: Secondary | ICD-10-CM | POA: Diagnosis not present

## 2018-08-11 ENCOUNTER — Telehealth: Payer: Self-pay

## 2018-08-11 NOTE — Telephone Encounter (Signed)
Called and notified Mary Meyers of CT results.  IMPRESSION: Chest Impression:  1. No evidence of thoracic metastasis. 2. Small hiatal hernia  Abdomen / Pelvis Impression:  1. No evidence of local endometrial carcinoma recurrence or metastasis in the abdomen pelvis. 2. Diverticulosis without evidence diverticulitis. Normal bowel otherwise. 3.  Aortic Atherosclerosis (ICD10   Oncology Nurse Navigator Documentation  Navigator Location: [facility] (08/11/18 1300)   )Navigator Encounter Type: Telephone;Diagnostic Results (08/11/18 1300)                                                    Time Spent with Patient: 15 (08/11/18 1300)

## 2018-08-15 ENCOUNTER — Encounter
Admission: RE | Disposition: A | Discharge status: Home / Self Care | Payer: Self-pay | Source: Ambulatory Visit | Attending: Gastroenterology

## 2018-08-15 ENCOUNTER — Ambulatory Visit: Payer: Medicare Other | Admitting: Anesthesiology

## 2018-08-15 ENCOUNTER — Ambulatory Visit
Admission: RE | Admit: 2018-08-15 | Discharge: 2018-08-15 | Disposition: A | Discharge status: Home / Self Care | Payer: Medicare Other | Source: Ambulatory Visit | Attending: Gastroenterology | Admitting: Gastroenterology

## 2018-08-15 ENCOUNTER — Encounter: Payer: Self-pay | Admitting: *Deleted

## 2018-08-15 DIAGNOSIS — F419 Anxiety disorder, unspecified: Secondary | ICD-10-CM | POA: Diagnosis not present

## 2018-08-15 DIAGNOSIS — K219 Gastro-esophageal reflux disease without esophagitis: Secondary | ICD-10-CM | POA: Diagnosis not present

## 2018-08-15 DIAGNOSIS — Z86010 Personal history of colonic polyps: Secondary | ICD-10-CM | POA: Diagnosis not present

## 2018-08-15 DIAGNOSIS — Z1211 Encounter for screening for malignant neoplasm of colon: Secondary | ICD-10-CM | POA: Insufficient documentation

## 2018-08-15 DIAGNOSIS — Z79899 Other long term (current) drug therapy: Secondary | ICD-10-CM | POA: Diagnosis not present

## 2018-08-15 DIAGNOSIS — I10 Essential (primary) hypertension: Secondary | ICD-10-CM | POA: Diagnosis not present

## 2018-08-15 DIAGNOSIS — K5730 Diverticulosis of large intestine without perforation or abscess without bleeding: Secondary | ICD-10-CM | POA: Diagnosis not present

## 2018-08-15 DIAGNOSIS — Z86711 Personal history of pulmonary embolism: Secondary | ICD-10-CM | POA: Insufficient documentation

## 2018-08-15 DIAGNOSIS — E039 Hypothyroidism, unspecified: Secondary | ICD-10-CM | POA: Diagnosis not present

## 2018-08-15 DIAGNOSIS — Z8542 Personal history of malignant neoplasm of other parts of uterus: Secondary | ICD-10-CM | POA: Diagnosis not present

## 2018-08-15 DIAGNOSIS — Z87891 Personal history of nicotine dependence: Secondary | ICD-10-CM | POA: Diagnosis not present

## 2018-08-15 DIAGNOSIS — D122 Benign neoplasm of ascending colon: Secondary | ICD-10-CM | POA: Insufficient documentation

## 2018-08-15 DIAGNOSIS — D124 Benign neoplasm of descending colon: Secondary | ICD-10-CM | POA: Diagnosis not present

## 2018-08-15 DIAGNOSIS — R140 Abdominal distension (gaseous): Secondary | ICD-10-CM

## 2018-08-15 DIAGNOSIS — E785 Hyperlipidemia, unspecified: Secondary | ICD-10-CM | POA: Insufficient documentation

## 2018-08-15 DIAGNOSIS — F329 Major depressive disorder, single episode, unspecified: Secondary | ICD-10-CM | POA: Diagnosis not present

## 2018-08-15 HISTORY — PX: COLONOSCOPY WITH PROPOFOL: SHX5780

## 2018-08-15 SURGERY — COLONOSCOPY WITH PROPOFOL
Anesthesia: General

## 2018-08-15 MED ORDER — PROPOFOL 10 MG/ML IV BOLUS
INTRAVENOUS | Status: DC | PRN
  Administered 2018-08-15 (×4): 20 mg via INTRAVENOUS
  Administered 2018-08-15: 30 mg via INTRAVENOUS

## 2018-08-15 MED ORDER — PROPOFOL 500 MG/50ML IV EMUL
INTRAVENOUS | Status: AC
  Filled 2018-08-15: qty 50

## 2018-08-15 MED ORDER — PHENYLEPHRINE HCL 10 MG/ML IJ SOLN
INTRAMUSCULAR | Status: AC
  Filled 2018-08-15: qty 1

## 2018-08-15 MED ORDER — SUCCINYLCHOLINE CHLORIDE 20 MG/ML IJ SOLN
INTRAMUSCULAR | Status: AC
  Filled 2018-08-15: qty 1

## 2018-08-15 MED ORDER — LIDOCAINE HCL (PF) 2 % IJ SOLN
INTRAMUSCULAR | Status: AC
  Filled 2018-08-15: qty 10

## 2018-08-15 MED ORDER — SODIUM CHLORIDE 0.9 % IV SOLN
INTRAVENOUS | Status: DC
  Administered 2018-08-15: 1000 mL via INTRAVENOUS

## 2018-08-15 MED ORDER — PROPOFOL 500 MG/50ML IV EMUL
INTRAVENOUS | Status: DC | PRN
  Administered 2018-08-15: 150 ug/kg/min via INTRAVENOUS

## 2018-08-15 MED ORDER — LIDOCAINE HCL (CARDIAC) PF 100 MG/5ML IV SOSY
PREFILLED_SYRINGE | INTRAVENOUS | Status: DC | PRN
  Administered 2018-08-15: 50 mg via INTRAVENOUS

## 2018-08-15 MED ORDER — EPHEDRINE SULFATE 50 MG/ML IJ SOLN
INTRAMUSCULAR | Status: AC
  Filled 2018-08-15: qty 1

## 2018-08-15 MED ORDER — GLYCOPYRROLATE 0.2 MG/ML IJ SOLN
INTRAMUSCULAR | Status: AC
  Filled 2018-08-15: qty 1
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
## 2018-08-15 NOTE — Op Note (Addendum)
[facility] Gastroenterology Patient Name: Nomie Buchberger Procedure Date: 08/15/2018 6:57 AM MRN: [id number] Account #: [id number] Date of Birth: 1946-03-08 Admit Type: Outpatient Age: 72 Room: [facility] ENDO ROOM 1 Gender: Female Note Status: Finalized Procedure:            Colonoscopy Indications:          High risk colon cancer surveillance: Personal history                        of colonic polyps Providers:            Jonathon Bellows MD, MD Referring MD:         Youlanda Roys. Lovie Macadamia, MD (Referring MD) Medicines:            Monitored Anesthesia Care Complications:        No immediate complications. Procedure:            Pre-Anesthesia Assessment:                       - Prior to the procedure, a History and Physical was                        performed, and patient medications, allergies and                        sensitivities were reviewed. The patient's tolerance of                        previous anesthesia was reviewed.                       - The risks and benefits of the procedure and the                        sedation options and risks were discussed with the                        patient. All questions were answered and informed                        consent was obtained.                       - ASA Grade Assessment: II - A patient with mild                        systemic disease.                       After obtaining informed consent, the colonoscope was                        passed under direct vision. Throughout the procedure,                        the patient's blood pressure, pulse, and oxygen                        saturations were monitored continuously. The  Colonoscope was introduced through the anus and                        advanced to the the cecum, identified by the                        appendiceal orifice, IC valve and transillumination.                        The colonoscopy was performed with ease. The patient                      tolerated the procedure well. The quality of the bowel                        preparation was good. Findings:      The perianal and digital rectal examinations were normal.      Two sessile polyps were found in the descending colon and ascending       colon. The polyps were 5 to 7 mm in size. These polyps were removed with       a cold snare. Resection and retrieval were complete.      Multiple small-mouthed diverticula were found in the entire colon.      The exam was otherwise without abnormality on direct and retroflexion       views. Impression:           - Two 5 to 7 mm polyps in the descending colon and in                        the ascending colon, removed with a cold snare.                        Resected and retrieved.                       - Diverticulosis in the entire examined colon.                       - The examination was otherwise normal on direct and                        retroflexion views. Recommendation:       - Discharge patient to home (with escort).                       - Resume previous diet.                       - Continue present medications.                       - Await pathology results.                       - Repeat colonoscopy in 5 years for surveillance. Procedure Code(s):    --- Professional ---                       [phone], Colonoscopy, flexible; with removal of tumor(s),  polyp(s), or other lesion(s) by snare technique Diagnosis Code(s):    --- Professional ---                       Z86.010, Personal history of colonic polyps                       D12.4, Benign neoplasm of descending colon                       D12.2, Benign neoplasm of ascending colon                       K57.30, Diverticulosis of large intestine without                        perforation or abscess without bleeding CPT copyright 2018 [employer]. All rights reserved. The codes documented in this report are preliminary  and upon coder review may  be revised to meet current compliance requirements. Jonathon Bellows, MD Jonathon Bellows MD, MD 08/15/2018 8:10:11 AM This report has been signed electronically. Number of Addenda: 0 Note Initiated On: 08/15/2018 6:57 AM Scope Withdrawal Time: 0 hours 13 minutes 42 seconds  Total Procedure Duration: 0 hours 19 minutes 55 seconds       [facility]

## 2018-08-15 NOTE — Anesthesia Postprocedure Evaluation (Signed)
Anesthesia Post Note  Patient: Mary Meyers  Procedure(s) Performed: COLONOSCOPY WITH PROPOFOL (N/A )  Patient location during evaluation: Endoscopy Anesthesia Type: General Level of consciousness: awake and alert, oriented and patient cooperative Pain management: pain level controlled Respiratory status: spontaneous breathing Cardiovascular status: blood pressure returned to baseline and stable Postop Assessment: no headache, no backache, patient able to bend at knees, no apparent nausea or vomiting, adequate PO intake and able to ambulate Anesthetic complications: yes     Last Vitals:  Vitals:   08/15/18 0704 08/15/18 0815  BP: (!) 140/101 133/72  Pulse: 69 81  Resp: 18 15  Temp: (!) 35.9 C (!) 36.3 C  SpO2: 97% 93%    Last Pain:  Vitals:   08/15/18 0815  TempSrc: Tympanic  PainSc: Okay

## 2018-08-15 NOTE — Anesthesia Post-op Follow-up Note (Signed)
Anesthesia QCDR form completed.        

## 2018-08-15 NOTE — Anesthesia Postprocedure Evaluation (Signed)
Anesthesia Post Note  Patient: Mary Meyers  Procedure(s) Performed: COLONOSCOPY WITH PROPOFOL (N/A )  Patient location during evaluation: Endoscopy Anesthesia Type: General Level of consciousness: awake and alert Pain management: pain level controlled Vital Signs Assessment: post-procedure vital signs reviewed and stable Respiratory status: spontaneous breathing, nonlabored ventilation, respiratory function stable and patient connected to nasal cannula oxygen Cardiovascular status: blood pressure returned to baseline and stable Postop Assessment: no apparent nausea or vomiting Anesthetic complications: no     Last Vitals:  Vitals:   08/15/18 0835 08/15/18 0845  BP: 127/70 127/70  Pulse: (!) 57 (!) 57  Resp: 16 16  Temp:    SpO2: 94%     Last Pain:  Vitals:   08/15/18 0845  TempSrc:   PainSc: 0-No pain                 Precious Haws Piscitello
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
## 2018-08-15 NOTE — Transfer of Care (Signed)
Immediate Anesthesia Transfer of Care Note  Patient: Mary Meyers  Procedure(s) Performed: COLONOSCOPY WITH PROPOFOL (N/A )  Patient Location: PACU and Endoscopy Unit  Anesthesia Type:General  Level of Consciousness: awake, alert , oriented and patient cooperative  Airway & Oxygen Therapy: Patient Spontanous Breathing  Post-op Assessment: Report given to RN, Post -op Vital signs reviewed and stable and Patient moving all extremities  Post vital signs: Reviewed and stable  Last Vitals:  Vitals Value Taken Time  BP 133/72 08/15/2018  8:15 AM  Temp 36.3 C 08/15/2018  8:15 AM  Pulse 81 08/15/2018  8:15 AM  Resp 15 08/15/2018  8:15 AM  SpO2 93 % 08/15/2018  8:15 AM    Last Pain:  Vitals:   08/15/18 0815  TempSrc: Tympanic  PainSc: Asleep         Complications: No apparent anesthesia complications

## 2018-08-16 ENCOUNTER — Encounter: Payer: Self-pay | Admitting: Gastroenterology

## 2018-08-16 LAB — SURGICAL PATHOLOGY

## 2018-08-20 ENCOUNTER — Encounter: Payer: Self-pay | Admitting: Gastroenterology

## 2018-09-01 ENCOUNTER — Ambulatory Visit: Payer: Medicare Other | Admitting: Radiation Oncology

## 2018-09-08 ENCOUNTER — Ambulatory Visit: Payer: Medicare Other | Admitting: Radiation Oncology

## 2018-11-02 ENCOUNTER — Encounter: Payer: Self-pay | Admitting: Radiation Oncology

## 2018-11-02 ENCOUNTER — Ambulatory Visit
Admission: RE | Admit: 2018-11-02 | Discharge: 2018-11-02 | Disposition: A | Discharge status: Home / Self Care | Payer: Medicare Other | Source: Ambulatory Visit | Attending: Radiation Oncology | Admitting: Radiation Oncology

## 2018-11-02 ENCOUNTER — Other Ambulatory Visit: Payer: Self-pay

## 2018-11-02 VITALS — BP 159/107 | HR 89 | Temp 97.4°F | Resp 18 | Wt 204.7 lb

## 2018-11-02 DIAGNOSIS — C541 Malignant neoplasm of endometrium: Secondary | ICD-10-CM

## 2018-11-02 DIAGNOSIS — Z923 Personal history of irradiation: Secondary | ICD-10-CM | POA: Insufficient documentation

## 2018-11-02 DIAGNOSIS — Z90722 Acquired absence of ovaries, bilateral: Secondary | ICD-10-CM | POA: Insufficient documentation

## 2018-11-02 DIAGNOSIS — Z8542 Personal history of malignant neoplasm of other parts of uterus: Secondary | ICD-10-CM | POA: Diagnosis present

## 2018-11-02 DIAGNOSIS — Z90710 Acquired absence of both cervix and uterus: Secondary | ICD-10-CM | POA: Diagnosis not present

## 2018-11-02 NOTE — Progress Notes (Signed)
Radiation Oncology Follow up Note  Name: Mary Meyers   Date:   11/02/2018 MRN:  [id number] DOB: 08/27/1946    This 73 y.o. 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  REFERRING PROVIDER: Juluis Pitch, MD  HPI: patient is a 73 year old female who is now 11 months out having completed external beam radiation therapy to her pelvis as well as vaginal cuff brachytherapy for high-grade endometrial carcinoma status post TAH/BSO. She seen today in routine follow-up is doing well specifically denies diarrhea dysuria or any other GI/GU complaints. No vaginal discharge or vaginal bleeding..she has close follow-up care with Dr. Theora Gianotti showing no evidence of disease on recent pelvic exams.she has CT scans of abdomen and pelvis and chestback in October which I reviewed showing no evidence of local recurrence or metastatic disease in chest abdomen and pelvis.  COMPLICATIONS OF TREATMENT: none  FOLLOW UP COMPLIANCE: keeps appointments   PHYSICAL EXAM:  BP (!) 159/107 (BP Location: Left Arm, Patient Position: Sitting)   Pulse 89   Temp (!) 97.4 F (36.3 C) (Tympanic)   Resp 18   Wt 204 lb 11.2 oz (92.8 kg)   BMI 37.44 kg/m  On speculum examination vaginal vault is clear no vaginal mucosal lesions are identified bimanual examination shows no evidence of parametrial mass or nodularity.Well-developed well-nourished patient in NAD. HEENT reveals PERLA, EOMI, discs not visualized.  Oral cavity is clear. No oral mucosal lesions are identified. Neck is clear without evidence of cervical or supraclavicular adenopathy. Lungs are clear to A&P. Cardiac examination is essentially unremarkable with regular rate and rhythm without murmur rub or thrill. Abdomen is benign with no organomegaly or masses noted. Motor sensory and DTR levels are equal and symmetric in the upper and lower extremities. Cranial  nerves II through XII are grossly intact. Proprioception is intact. No peripheral adenopathy or edema is identified. No motor or sensory levels are noted. Crude visual fields are within normal range.  RADIOLOGY RESULTS: CT scans are reviewed and compatible with the above-stated findings  PLAN: present time patient continues to do well with no evidence of disease. I have asked to see her back in 1 year for follow-up. Patient is to call sooner with any concerns at any time.  I would like to take this opportunity to thank you for allowing me to participate in the care of your patient.Noreene Filbert, MD

## 2019-01-30 ENCOUNTER — Other Ambulatory Visit: Payer: Self-pay

## 2019-01-31 ENCOUNTER — Encounter: Payer: Self-pay | Admitting: Obstetrics and Gynecology

## 2019-01-31 ENCOUNTER — Inpatient Hospital Stay: Payer: Medicare Other | Attending: Obstetrics and Gynecology | Admitting: Obstetrics and Gynecology

## 2019-01-31 ENCOUNTER — Other Ambulatory Visit: Payer: Self-pay | Admitting: Family Medicine

## 2019-01-31 DIAGNOSIS — N6320 Unspecified lump in the left breast, unspecified quadrant: Secondary | ICD-10-CM

## 2019-01-31 DIAGNOSIS — C541 Malignant neoplasm of endometrium: Secondary | ICD-10-CM
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
## 2019-02-07 ENCOUNTER — Other Ambulatory Visit: Payer: Self-pay

## 2019-02-07 ENCOUNTER — Ambulatory Visit
Admission: RE | Admit: 2019-02-07 | Discharge: 2019-02-07 | Disposition: A | Discharge status: Home / Self Care | Payer: Medicare Other | Source: Ambulatory Visit | Attending: Family Medicine | Admitting: Family Medicine

## 2019-02-07 DIAGNOSIS — N6320 Unspecified lump in the left breast, unspecified quadrant: Secondary | ICD-10-CM

## 2019-02-07 DIAGNOSIS — N6325 Unspecified lump in the left breast, overlapping quadrants: Secondary | ICD-10-CM | POA: Insufficient documentation

## 2019-04-09 DIAGNOSIS — R42 Dizziness and giddiness: Secondary | ICD-10-CM | POA: Insufficient documentation

## 2019-05-02 ENCOUNTER — Inpatient Hospital Stay: Payer: Medicare Other

## 2019-05-09 ENCOUNTER — Inpatient Hospital Stay: Payer: Medicare Other | Attending: Obstetrics and Gynecology

## 2019-05-09 ENCOUNTER — Other Ambulatory Visit: Payer: Self-pay

## 2019-05-09 ENCOUNTER — Inpatient Hospital Stay: Payer: Medicare Other | Attending: Obstetrics and Gynecology | Admitting: Obstetrics and Gynecology

## 2019-05-09 VITALS — BP 146/91 | HR 109 | Temp 97.3°F | Wt 205.0 lb

## 2019-05-09 DIAGNOSIS — E785 Hyperlipidemia, unspecified: Secondary | ICD-10-CM | POA: Insufficient documentation

## 2019-05-09 DIAGNOSIS — I700 Atherosclerosis of aorta: Secondary | ICD-10-CM | POA: Insufficient documentation

## 2019-05-09 DIAGNOSIS — C541 Malignant neoplasm of endometrium: Secondary | ICD-10-CM | POA: Diagnosis not present

## 2019-05-09 DIAGNOSIS — Z86718 Personal history of other venous thrombosis and embolism: Secondary | ICD-10-CM | POA: Insufficient documentation

## 2019-05-09 DIAGNOSIS — Z90722 Acquired absence of ovaries, bilateral: Secondary | ICD-10-CM | POA: Insufficient documentation

## 2019-05-09 DIAGNOSIS — K5730 Diverticulosis of large intestine without perforation or abscess without bleeding: Secondary | ICD-10-CM | POA: Diagnosis not present

## 2019-05-09 DIAGNOSIS — Z7901 Long term (current) use of anticoagulants: Secondary | ICD-10-CM | POA: Diagnosis not present

## 2019-05-09 DIAGNOSIS — E039 Hypothyroidism, unspecified: Secondary | ICD-10-CM | POA: Insufficient documentation

## 2019-05-09 DIAGNOSIS — N6320 Unspecified lump in the left breast, unspecified quadrant: Secondary | ICD-10-CM | POA: Diagnosis not present

## 2019-05-09 DIAGNOSIS — K449 Diaphragmatic hernia without obstruction or gangrene: Secondary | ICD-10-CM | POA: Insufficient documentation

## 2019-05-09 DIAGNOSIS — I2510 Atherosclerotic heart disease of native coronary artery without angina pectoris: Secondary | ICD-10-CM | POA: Diagnosis not present

## 2019-05-09 DIAGNOSIS — Z86711 Personal history of pulmonary embolism: Secondary | ICD-10-CM | POA: Insufficient documentation

## 2019-05-09 DIAGNOSIS — Z803 Family history of malignant neoplasm of breast: Secondary | ICD-10-CM | POA: Diagnosis not present

## 2019-05-09 DIAGNOSIS — I10 Essential (primary) hypertension: Secondary | ICD-10-CM | POA: Diagnosis not present

## 2019-05-09 DIAGNOSIS — F419 Anxiety disorder, unspecified: Secondary | ICD-10-CM | POA: Insufficient documentation

## 2019-05-09 DIAGNOSIS — K219 Gastro-esophageal reflux disease without esophagitis: Secondary | ICD-10-CM | POA: Diagnosis not present

## 2019-05-09 DIAGNOSIS — Z923 Personal history of irradiation: Secondary | ICD-10-CM | POA: Diagnosis not present

## 2019-05-09 DIAGNOSIS — R5383 Other fatigue: Secondary | ICD-10-CM | POA: Insufficient documentation

## 2019-05-09 DIAGNOSIS — Z87891 Personal history of nicotine dependence: Secondary | ICD-10-CM

## 2019-05-09 DIAGNOSIS — M1990 Unspecified osteoarthritis, unspecified site: Secondary | ICD-10-CM | POA: Insufficient documentation

## 2019-05-09 DIAGNOSIS — Z90710 Acquired absence of both cervix and uterus: Secondary | ICD-10-CM | POA: Insufficient documentation

## 2019-05-09 DIAGNOSIS — Z6837 Body mass index (BMI) 37.0-37.9, adult: Secondary | ICD-10-CM | POA: Insufficient documentation

## 2019-05-09 DIAGNOSIS — Z79899 Other long term (current) drug therapy: Secondary | ICD-10-CM | POA: Diagnosis not present

## 2019-05-09 DIAGNOSIS — F329 Major depressive disorder, single episode, unspecified: Secondary | ICD-10-CM | POA: Diagnosis not present

## 2019-05-09 DIAGNOSIS — Z791 Long term (current) use of non-steroidal anti-inflammatories (NSAID): Secondary | ICD-10-CM | POA: Insufficient documentation

## 2019-05-09 DIAGNOSIS — E669 Obesity, unspecified: Secondary | ICD-10-CM | POA: Diagnosis not present

## 2019-05-09 DIAGNOSIS — Z8249 Family history of ischemic heart disease and other diseases of the circulatory system: Secondary | ICD-10-CM | POA: Insufficient documentation

## 2019-05-09 LAB — CBC
HCT: 38.7 % (ref 36.0–46.0)
Hemoglobin: 13.1 g/dL (ref 12.0–15.0)
MCH: 31 pg (ref 26.0–34.0)
MCHC: 33.9 g/dL (ref 30.0–36.0)
MCV: 91.5 fL (ref 80.0–100.0)
Platelets: 352 10*3/uL (ref 150–400)
RBC: 4.23 MIL/uL (ref 3.87–5.11)
RDW: 13 % (ref 11.5–15.5)
WBC: 6.1 10*3/uL (ref 4.0–10.5)
nRBC: 0 % (ref 0.0–0.2)
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
## 2019-05-09 NOTE — Patient Instructions (Signed)
Please schedule appt with Dr. Ouida Sills for October 2020 for your continued follow up visit.   Dr. Baruch Gouty will see you in January 2021.   Return to see Dr. Theora Gianotti in April 2021.   Please follow up with your primary care doctors about your other symptoms.

## 2019-08-08 ENCOUNTER — Ambulatory Visit: Payer: Medicare Other

## 2019-11-08 ENCOUNTER — Ambulatory Visit: Payer: Medicare Other | Admitting: Radiation Oncology

## 2019-11-13 ENCOUNTER — Other Ambulatory Visit: Payer: Self-pay | Admitting: Family Medicine

## 2019-11-13 DIAGNOSIS — Z1231 Encounter for screening mammogram for malignant neoplasm of breast: Secondary | ICD-10-CM

## 2019-11-13 DIAGNOSIS — R928 Other abnormal and inconclusive findings on diagnostic imaging of breast: Secondary | ICD-10-CM

## 2019-11-15 ENCOUNTER — Ambulatory Visit
Admission: RE | Admit: 2019-11-15 | Discharge: 2019-11-15 | Disposition: A | Discharge status: Home / Self Care | Payer: Medicare Other | Source: Ambulatory Visit | Attending: Family Medicine | Admitting: Family Medicine

## 2019-11-15 DIAGNOSIS — R928 Other abnormal and inconclusive findings on diagnostic imaging of breast: Secondary | ICD-10-CM | POA: Insufficient documentation

## 2019-11-21 ENCOUNTER — Ambulatory Visit
Admission: RE | Admit: 2019-11-21 | Discharge: 2019-11-21 | Disposition: A | Discharge status: Home / Self Care | Payer: Medicare Other | Source: Ambulatory Visit | Attending: Radiation Oncology | Admitting: Radiation Oncology

## 2019-11-21 ENCOUNTER — Encounter: Payer: Self-pay | Admitting: Radiation Oncology

## 2019-11-21 ENCOUNTER — Other Ambulatory Visit: Payer: Self-pay

## 2019-11-21 VITALS — BP 154/92 | HR 62 | Resp 18 | Wt 212.2 lb

## 2019-11-21 DIAGNOSIS — C541 Malignant neoplasm of endometrium: Secondary | ICD-10-CM

## 2019-11-21 DIAGNOSIS — Z923 Personal history of irradiation: Secondary | ICD-10-CM | POA: Insufficient documentation

## 2019-11-21 DIAGNOSIS — Z90710 Acquired absence of both cervix and uterus: Secondary | ICD-10-CM | POA: Diagnosis not present

## 2019-11-21 DIAGNOSIS — Z90722 Acquired absence of ovaries, bilateral: Secondary | ICD-10-CM | POA: Insufficient documentation

## 2019-11-21 DIAGNOSIS — Z8542 Personal history of malignant neoplasm of other parts of uterus: Secondary | ICD-10-CM | POA: Insufficient documentation

## 2019-11-21 NOTE — Progress Notes (Signed)
Radiation Oncology Follow up Note  Name: Mary Meyers   Date:   11/21/2019 MRN:  [id number] DOB: 01/12/1946    This 73 y.o. female presents to the clinic today for 2-year follow-up status post external beam radiation therapy to her pelvis as well as vaginal cuff brachytherapy for high-grade endometrial carcinoma status post TAH/BSO.  REFERRING PROVIDER: Juluis Pitch, MD  HPI: Patient is a 74 year old female now seen out 2 years having completed both external beam radiation therapy to her whole pelvis as well as vaginal cuff brachytherapy for high-grade endometrial carcinoma status post TAH/BSO.  She is seen today in routine follow-up and is doing well specifically denies vaginal discharge diarrhea or any increased lower urinary tract symptoms.  She states her bowels are somewhat harder to pass at this point time..  COMPLICATIONS OF TREATMENT: none  FOLLOW UP COMPLIANCE: keeps appointments   PHYSICAL EXAM:  BP (!) 154/92 (BP Location: Left Arm, Patient Position: Sitting)   Pulse 62   Resp 18   Wt 212 lb 3.2 oz (96.3 kg)   BMI 38.81 kg/m  On speculum examination vaginal vault is clear.  There is mild stenosis at the vaginal apex.  Bimanual examination shows no evidence of parametrial mass rectal exam is unremarkable.  Well-developed well-nourished patient in NAD. HEENT reveals PERLA, EOMI, discs not visualized.  Oral cavity is clear. No oral mucosal lesions are identified. Neck is clear without evidence of cervical or supraclavicular adenopathy. Lungs are clear to A&P. Cardiac examination is essentially unremarkable with regular rate and rhythm without murmur rub or thrill. Abdomen is benign with no organomegaly or masses noted. Motor sensory and DTR levels are equal and symmetric in the upper and lower extremities. Cranial nerves II through XII are grossly intact. Proprioception is intact. No peripheral adenopathy or edema is identified. No motor or sensory levels are noted. Crude  visual fields are within normal range.  RADIOLOGY RESULTS: No current films to review  PLAN: Present time patient is doing well with no evidence of disease and no significant symptom profile.  I am pleased with her overall progress.  I have asked to see her back in 1 year for follow-up.  Patient knows to call with any concerns.  I have emphasized the need for her to have mammogram she had an ultrasound back in January which showed a benign breast mass most likely a lipoma.  I would like to take this opportunity to thank you for allowing me to participate in the care of your patient.Noreene Filbert, MD

## 2020-02-06 ENCOUNTER — Inpatient Hospital Stay: Payer: Medicare Other | Attending: Obstetrics and Gynecology | Admitting: Obstetrics and Gynecology

## 2020-02-06 ENCOUNTER — Other Ambulatory Visit: Payer: Self-pay

## 2020-02-06 VITALS — BP 161/92 | HR 73 | Temp 98.1°F | Resp 20 | Wt 211.0 lb

## 2020-02-06 DIAGNOSIS — Z8542 Personal history of malignant neoplasm of other parts of uterus: Secondary | ICD-10-CM | POA: Insufficient documentation

## 2020-02-06 DIAGNOSIS — Z90710 Acquired absence of both cervix and uterus: Secondary | ICD-10-CM | POA: Insufficient documentation

## 2020-02-06 DIAGNOSIS — C541 Malignant neoplasm of endometrium: Secondary | ICD-10-CM

## 2020-02-06 DIAGNOSIS — Z08 Encounter for follow-up examination after completed treatment for malignant neoplasm: Secondary | ICD-10-CM | POA: Insufficient documentation

## 2020-02-06 DIAGNOSIS — Z90722 Acquired absence of ovaries, bilateral: Secondary | ICD-10-CM | POA: Diagnosis not present

## 2020-02-06 NOTE — Progress Notes (Signed)
No gyn concerns at this time
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
## 2020-06-04 ENCOUNTER — Other Ambulatory Visit: Payer: Self-pay

## 2020-06-04 ENCOUNTER — Inpatient Hospital Stay: Payer: Medicare Other | Attending: Obstetrics and Gynecology | Admitting: Obstetrics and Gynecology

## 2020-06-04 ENCOUNTER — Other Ambulatory Visit: Payer: Self-pay | Admitting: Family Medicine

## 2020-06-04 VITALS — BP 128/88 | HR 63 | Temp 97.8°F | Resp 20 | Wt 208.1 lb

## 2020-06-04 DIAGNOSIS — Z7901 Long term (current) use of anticoagulants: Secondary | ICD-10-CM | POA: Insufficient documentation

## 2020-06-04 DIAGNOSIS — E039 Hypothyroidism, unspecified: Secondary | ICD-10-CM | POA: Insufficient documentation

## 2020-06-04 DIAGNOSIS — E669 Obesity, unspecified: Secondary | ICD-10-CM | POA: Insufficient documentation

## 2020-06-04 DIAGNOSIS — Z86718 Personal history of other venous thrombosis and embolism: Secondary | ICD-10-CM | POA: Insufficient documentation

## 2020-06-04 DIAGNOSIS — Z923 Personal history of irradiation: Secondary | ICD-10-CM | POA: Diagnosis not present

## 2020-06-04 DIAGNOSIS — E785 Hyperlipidemia, unspecified: Secondary | ICD-10-CM | POA: Insufficient documentation

## 2020-06-04 DIAGNOSIS — Z1231 Encounter for screening mammogram for malignant neoplasm of breast: Secondary | ICD-10-CM

## 2020-06-04 DIAGNOSIS — F329 Major depressive disorder, single episode, unspecified: Secondary | ICD-10-CM | POA: Insufficient documentation

## 2020-06-04 DIAGNOSIS — F419 Anxiety disorder, unspecified: Secondary | ICD-10-CM | POA: Insufficient documentation

## 2020-06-04 DIAGNOSIS — I700 Atherosclerosis of aorta: Secondary | ICD-10-CM | POA: Diagnosis not present

## 2020-06-04 DIAGNOSIS — Z791 Long term (current) use of non-steroidal anti-inflammatories (NSAID): Secondary | ICD-10-CM | POA: Insufficient documentation

## 2020-06-04 DIAGNOSIS — K219 Gastro-esophageal reflux disease without esophagitis: Secondary | ICD-10-CM | POA: Diagnosis not present

## 2020-06-04 DIAGNOSIS — C541 Malignant neoplasm of endometrium: Secondary | ICD-10-CM | POA: Insufficient documentation

## 2020-06-04 DIAGNOSIS — Z90710 Acquired absence of both cervix and uterus: Secondary | ICD-10-CM | POA: Diagnosis not present

## 2020-06-04 DIAGNOSIS — Z86711 Personal history of pulmonary embolism: Secondary | ICD-10-CM | POA: Diagnosis not present

## 2020-06-04 DIAGNOSIS — Z90722 Acquired absence of ovaries, bilateral: Secondary | ICD-10-CM | POA: Insufficient documentation

## 2020-06-04 DIAGNOSIS — M1990 Unspecified osteoarthritis, unspecified site: Secondary | ICD-10-CM | POA: Diagnosis not present

## 2020-06-04 DIAGNOSIS — Z79899 Other long term (current) drug therapy: Secondary | ICD-10-CM | POA: Diagnosis not present

## 2020-06-04 DIAGNOSIS — I10 Essential (primary) hypertension: Secondary | ICD-10-CM | POA: Insufficient documentation

## 2020-06-04 DIAGNOSIS — I2510 Atherosclerotic heart disease of native coronary artery without angina pectoris: Secondary | ICD-10-CM | POA: Insufficient documentation
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
## 2020-06-25 ENCOUNTER — Ambulatory Visit
Admission: RE | Admit: 2020-06-25 | Discharge: 2020-06-25 | Disposition: A | Discharge status: Home / Self Care | Payer: Medicare Other | Source: Ambulatory Visit | Attending: Family Medicine | Admitting: Family Medicine

## 2020-06-25 DIAGNOSIS — Z1231 Encounter for screening mammogram for malignant neoplasm of breast: Secondary | ICD-10-CM

## 2020-07-09 ENCOUNTER — Ambulatory Visit (INDEPENDENT_AMBULATORY_CARE_PROVIDER_SITE_OTHER): Payer: Medicare Other | Admitting: Dermatology

## 2020-07-09 ENCOUNTER — Other Ambulatory Visit: Payer: Self-pay

## 2020-07-09 ENCOUNTER — Encounter: Payer: Self-pay | Admitting: Dermatology

## 2020-07-09 DIAGNOSIS — L719 Rosacea, unspecified: Secondary | ICD-10-CM

## 2020-07-09 DIAGNOSIS — D485 Neoplasm of uncertain behavior of skin: Secondary | ICD-10-CM | POA: Diagnosis not present

## 2020-07-09 DIAGNOSIS — D229 Melanocytic nevi, unspecified: Secondary | ICD-10-CM

## 2020-07-09 DIAGNOSIS — L814 Other melanin hyperpigmentation: Secondary | ICD-10-CM

## 2020-07-09 DIAGNOSIS — D1800 Hemangioma unspecified site: Secondary | ICD-10-CM

## 2020-07-09 DIAGNOSIS — L821 Other seborrheic keratosis: Secondary | ICD-10-CM

## 2020-07-09 DIAGNOSIS — Z1283 Encounter for screening for malignant neoplasm of skin: Secondary | ICD-10-CM

## 2020-07-09 DIAGNOSIS — L578 Other skin changes due to chronic exposure to nonionizing radiation: Secondary | ICD-10-CM

## 2020-07-09 DIAGNOSIS — L820 Inflamed seborrheic keratosis: Secondary | ICD-10-CM | POA: Diagnosis not present

## 2020-07-09 DIAGNOSIS — R21 Rash and other nonspecific skin eruption: Secondary | ICD-10-CM

## 2020-07-09 DIAGNOSIS — D492 Neoplasm of unspecified behavior of bone, soft tissue, and skin: Secondary | ICD-10-CM

## 2020-07-09 MED ORDER — DOXYCYCLINE HYCLATE 20 MG PO TABS: ORAL_TABLET | ORAL | 2 refills | Status: DC

## 2020-07-09 MED ORDER — TRIAMCINOLONE ACETONIDE 0.1 % EX CREA: 1.0000 "application " | TOPICAL_CREAM | Freq: Two times a day (BID) | CUTANEOUS | 2 refills | Status: DC | PRN

## 2020-07-09 NOTE — Patient Instructions (Addendum)
Melanoma ABCDEs  Melanoma is the most dangerous type of skin cancer, and is the leading cause of death from skin disease.  You are more likely to develop melanoma if you:  Have light-colored skin, light-colored eyes, or red or blond hair  Spend a lot of time in the sun  Tan regularly, either outdoors or in a tanning bed  Have had blistering sunburns, especially during childhood  Have a close family member who has had a melanoma  Have atypical moles or large birthmarks  Early detection of melanoma is key since treatment is typically straightforward and cure rates are extremely high if we catch it early.   The first sign of melanoma is often a change in a mole or a new dark spot.  The ABCDE system is a way of remembering the signs of melanoma.  A for asymmetry:  The two halves do not match. B for border:  The edges of the growth are irregular. C for color:  A mixture of colors are present instead of an even brown color. D for diameter:  Melanomas are usually (but not always) greater than 36mm - the size of a pencil eraser. E for evolution:  The spot keeps changing in size, shape, and color.  Please check your skin once per month between visits. You can use a small mirror in front and a large mirror behind you to keep an eye on the back side or your body.   If you see any new or changing lesions before your next follow-up, please call to schedule a visit.  Please continue daily skin protection including broad spectrum sunscreen SPF 30+ to sun-exposed areas, reapplying every 2 hours as needed when you're outdoors.   Cryotherapy Aftercare  . Wash gently with soap and water everyday.   Marland Kitchen Apply Vaseline and Band-Aid daily until healed.  Prior to procedure, discussed risks of blister formation, small wound, skin dyspigmentation, or rare scar following cryotherapy.  Wound Care Instructions  1. Cleanse wound gently with soap and water once a day then pat dry with clean gauze. Apply a  thing coat of Petrolatum (petroleum jelly, "Vaseline") over the wound (unless you have an allergy to this). We recommend that you use a new, sterile tube of Vaseline. Do not pick or remove scabs. Do not remove the yellow or white "healing tissue" from the base of the wound.  2. Cover the wound with fresh, clean, nonstick gauze and secure with paper tape. You may use Band-Aids in place of gauze and tape if the would is small enough, but would recommend trimming much of the tape off as there is often too much. Sometimes Band-Aids can irritate the skin.  3. You should call the office for your biopsy report after 1 week if you have not already been contacted.  4. If you experience any problems, such as abnormal amounts of bleeding, swelling, significant bruising, significant pain, or evidence of infection, please call the office immediately.  5. FOR ADULT SURGERY PATIENTS: If you need something for pain relief you may take 1 extra strength Tylenol (acetaminophen) AND 2 Ibuprofen (200mg  each) together every 4 hours as needed for pain. (do not take these if you are allergic to them or if you have a reason you should not take them.) Typically, you may only need pain medication for 1 to 3 days.    Recommend taking Heliocare sun protection supplement daily in sunny weather for additional sun protection. For maximum protection on the sunniest days, you  can take up to 2 capsules of regular Heliocare OR take 1 capsule of Heliocare Ultra. For prolonged exposure (such as a full day in the sun), you can repeat your dose of the supplement 4 hours after your first dose. Heliocare can be purchased at [facility] or at [email or web address].

## 2020-07-09 NOTE — Progress Notes (Signed)
New Patient Visit  Subjective  Mary Meyers is a 74 y.o. female who presents for the following: lesion, red spots on face and skin cancer screening  She reports lesions on her nose, face and right ear.  They look like red spots.  They have been present for years.  She has been using MetroGel 0.75%.  Patient here for full body skin exam and skin cancer screening.  She also gets red and inflamed on arms with sun exposure. She has dry thick patches on elbows. She has used prescription creams to her elbows without help in the past.  The following portions of the chart were reviewed this encounter and updated as appropriate: medications, allergies, past medical history, social history  Review of Systems:  No other skin or systemic complaints except as noted in HPI or Assessment and Plan.   Objective  Well appearing patient in no apparent distress; mood and affect are within normal limits.  Review of Systems: No other skin or systemic complaints except as noted in HPI or Assessment and Plan.  A full examination was performed including scalp, head, eyes, ears, nose, lips, neck, chest, axillae, abdomen, back, buttocks, bilateral upper extremities, bilateral lower extremities, hands, feet, fingers, toes, fingernails, and toenails. All findings within normal limits unless otherwise noted below.  Objective  Right Antihelix x1: Erythematous keratotic or waxy stuck-on papule or plaque.   Objective  Nose, cheeks: Mid face erythema with telangiectasias  and scattered inflammatory papules.   Images        Objective  Left Medial Cheek: 0.5cm yellow papule     Objective  Right Forearm - Posterior: No active rash today.   Assessment & Plan  Inflamed seborrheic keratosis Right Antihelix x1    Destruction of lesion - Right Antihelix x1  Destruction method: cryotherapy   Informed consent: discussed and consent obtained   Lesion destroyed using liquid nitrogen: Yes     Outcome: patient tolerated procedure well with no complications   Post-procedure details: wound care instructions given    Rosacea Nose, cheeks  Chronic, flared.  She reports some irritation of her eyes.  Continue Metronidazole 0.75% gel BID as directed.  Start Doxycycline 20mg  1 po BID with food.   Doxycycline should be taken with food to prevent nausea. Do not lay down for 30 minutes after taking. Be cautious with sun exposure and use good sun protection while on this medication. Pregnant women should not take this medication.    doxycycline (PERIOSTAT) 20 MG tablet - Nose, cheeks  Neoplasm of skin Left Medial Cheek  Skin / nail biopsy Type of biopsy: tangential   Informed consent: discussed and consent obtained   Anesthesia: the lesion was anesthetized in a standard fashion   Anesthesia comment:  Area prepped with alcohol Anesthetic:  1% lidocaine w/ epinephrine 1-100,000 buffered w/ 8.4% NaHCO3 Instrument used: DermaBlade   Hemostasis achieved with: pressure and aluminum chloride   Outcome: patient tolerated procedure well   Post-procedure details: wound care instructions given   Post-procedure details comment:  Ointment and small bandage applied  Specimen 1 - Surgical pathology Differential Diagnosis: R/O sebaceous neoplasm Check Margins: No 0.5cm yellow papule  Rash Right Forearm - Posterior  By history this rash comes up in summer with sun exposure.    Recommend daily broad spectrum sunscreen SPF 30+ to sun-exposed areas, reapply every 2 hours as needed. Call for new or changing lesions.   Recommend taking Heliocare sun protection supplement daily in sunny weather for  additional sun protection. For maximum protection on the sunniest days, you can take up to 2 capsules of regular Heliocare OR take 1 capsule of Heliocare Ultra. For prolonged exposure (such as a full day in the sun), you can repeat your dose of the supplement 4 hours after your first dose. Heliocare  can be purchased at [facility] or at [email or web address].    Start triamcinolone 0.1% cream BID up to 2 weeks PRN flares.  Take photos and call for appointment when rash is active.  triamcinolone cream (KENALOG) 0.1 % - Right Forearm - Posterior   Lentigines - Scattered tan macules - Discussed due to sun exposure - Benign, observe - Call for any changes  Seborrheic Keratoses - Stuck-on, waxy, tan-brown papules and plaques  - Discussed benign etiology and prognosis. - Observe - Call for any changes  Melanocytic Nevi - Tan-brown and/or pink-flesh-colored symmetric macules and papules - Benign appearing on exam today - Observation - Call clinic for new or changing moles - Recommend daily use of broad spectrum spf 30+ sunscreen to sun-exposed areas.   Hemangiomas - Red papules - Discussed benign nature - Observe - Call for any changes  Actinic Damage - diffuse scaly erythematous macules with underlying dyspigmentation - Recommend daily broad spectrum sunscreen SPF 30+ to sun-exposed areas, reapply every 2 hours as needed.  - Call for new or changing lesions.  Skin cancer screening performed today.    Return in about 3 months (around 10/08/2020) for rosacea follow-up.   I, Emelia Salisbury, CMA, am acting as scribe for Forest Gleason, MD.  Documentation: I have reviewed the above documentation for accuracy and completeness, and I agree with the above.  Forest Gleason, MD

## 2020-07-14 ENCOUNTER — Encounter: Payer: Self-pay | Admitting: Dermatology

## 2020-07-15 NOTE — Progress Notes (Signed)
Skin , left medial cheek SEBACEOUS GLAND HYPERPLASIA  This is a dilated oil gland. No treatment is needed.

## 2020-08-01 ENCOUNTER — Encounter: Payer: Self-pay | Admitting: Dermatology

## 2020-09-17 ENCOUNTER — Encounter: Payer: Self-pay | Admitting: Obstetrics and Gynecology

## 2020-09-17 ENCOUNTER — Inpatient Hospital Stay: Payer: Medicare Other | Attending: Obstetrics and Gynecology | Admitting: Obstetrics and Gynecology

## 2020-09-17 ENCOUNTER — Other Ambulatory Visit: Payer: Self-pay

## 2020-09-17 VITALS — BP 137/71 | HR 77 | Temp 98.0°F | Resp 20 | Wt 211.3 lb

## 2020-09-17 DIAGNOSIS — F32A Depression, unspecified: Secondary | ICD-10-CM | POA: Diagnosis not present

## 2020-09-17 DIAGNOSIS — Z90722 Acquired absence of ovaries, bilateral: Secondary | ICD-10-CM | POA: Diagnosis not present

## 2020-09-17 DIAGNOSIS — Z7901 Long term (current) use of anticoagulants: Secondary | ICD-10-CM | POA: Diagnosis not present

## 2020-09-17 DIAGNOSIS — C541 Malignant neoplasm of endometrium: Secondary | ICD-10-CM | POA: Insufficient documentation

## 2020-09-17 DIAGNOSIS — K5730 Diverticulosis of large intestine without perforation or abscess without bleeding: Secondary | ICD-10-CM | POA: Insufficient documentation

## 2020-09-17 DIAGNOSIS — I2510 Atherosclerotic heart disease of native coronary artery without angina pectoris: Secondary | ICD-10-CM | POA: Diagnosis not present

## 2020-09-17 DIAGNOSIS — E039 Hypothyroidism, unspecified: Secondary | ICD-10-CM | POA: Insufficient documentation

## 2020-09-17 DIAGNOSIS — I10 Essential (primary) hypertension: Secondary | ICD-10-CM | POA: Diagnosis not present

## 2020-09-17 DIAGNOSIS — E785 Hyperlipidemia, unspecified: Secondary | ICD-10-CM | POA: Insufficient documentation

## 2020-09-17 DIAGNOSIS — R42 Dizziness and giddiness: Secondary | ICD-10-CM | POA: Diagnosis not present

## 2020-09-17 DIAGNOSIS — Z90710 Acquired absence of both cervix and uterus: Secondary | ICD-10-CM | POA: Diagnosis not present

## 2020-09-17 DIAGNOSIS — K219 Gastro-esophageal reflux disease without esophagitis: Secondary | ICD-10-CM | POA: Diagnosis not present

## 2020-09-17 DIAGNOSIS — Z923 Personal history of irradiation: Secondary | ICD-10-CM | POA: Diagnosis not present

## 2020-09-17 DIAGNOSIS — Z87891 Personal history of nicotine dependence: Secondary | ICD-10-CM | POA: Diagnosis not present

## 2020-09-17 DIAGNOSIS — E669 Obesity, unspecified: Secondary | ICD-10-CM | POA: Diagnosis not present

## 2020-09-17 DIAGNOSIS — Z791 Long term (current) use of non-steroidal anti-inflammatories (NSAID): Secondary | ICD-10-CM | POA: Diagnosis not present

## 2020-09-17 DIAGNOSIS — Z79899 Other long term (current) drug therapy: Secondary | ICD-10-CM | POA: Insufficient documentation

## 2020-09-17 DIAGNOSIS — Z86711 Personal history of pulmonary embolism: Secondary | ICD-10-CM | POA: Diagnosis not present

## 2020-09-17 DIAGNOSIS — R001 Bradycardia, unspecified: Secondary | ICD-10-CM | POA: Insufficient documentation

## 2020-09-17 DIAGNOSIS — R911 Solitary pulmonary nodule: Secondary | ICD-10-CM | POA: Diagnosis not present

## 2020-09-17 DIAGNOSIS — I700 Atherosclerosis of aorta: Secondary | ICD-10-CM | POA: Diagnosis not present
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
## 2020-09-24 ENCOUNTER — Ambulatory Visit: Payer: Medicare Other | Admitting: Dermatology

## 2020-11-21 DIAGNOSIS — R7309 Other abnormal glucose: Secondary | ICD-10-CM | POA: Insufficient documentation

## 2020-11-21 DIAGNOSIS — E538 Deficiency of other specified B group vitamins: Secondary | ICD-10-CM | POA: Insufficient documentation

## 2020-11-26 ENCOUNTER — Ambulatory Visit
Admission: RE | Admit: 2020-11-26 | Discharge: 2020-11-26 | Disposition: A | Discharge status: Home / Self Care | Payer: Medicare Other | Source: Ambulatory Visit | Attending: Radiation Oncology | Admitting: Radiation Oncology

## 2020-11-26 ENCOUNTER — Encounter: Payer: Self-pay | Admitting: Radiation Oncology

## 2020-11-26 VITALS — BP 131/87 | HR 69 | Temp 97.1°F | Wt 216.0 lb

## 2020-11-26 DIAGNOSIS — C541 Malignant neoplasm of endometrium: Secondary | ICD-10-CM

## 2020-11-26 NOTE — Progress Notes (Signed)
Radiation Oncology Follow up Note  Name: Mary Meyers   Date:   11/26/2020 MRN:  [id number] DOB: [birth year]    This 75 y.o. female presents to the clinic today for over 2-1/2-year follow-up status post external beam radiation therapy and vaginal brachytherapy in the adjuvant setting for high-grade endometrial carcinoma.  REFERRING PROVIDER: Juluis Pitch, MD  HPI: Patient is a 75 year old gravida 50 para 23 female now out over 2 and half years having completed external beam radiation therapy plus vaginal brachytherapy for mixed endometrioid and high-grade adenocarcinoma of the uterus.  With clear cell features FIGO grade 3 invading 6 mm of 18 mm thickness status post total laparoscopic hysterectomy bilateral SL and bilateral pelvic sentinel node biopsies back in January 2019.  Seen today in routine follow-up she is doing well she specifically denies any increased lower urinary tract symptoms diarrhea fatigue or vaginal discharge.  She is having regular pelvic examinations by Dr. Theora Gianotti and [facility] showing no evidence of disease.  Her postop CT scans have been clear.  She had mammograms back in September which I have reviewed were BI-RADS 1 negative  COMPLICATIONS OF TREATMENT: none  FOLLOW UP COMPLIANCE: keeps appointments   PHYSICAL EXAM:  BP 131/87   Pulse 69   Temp (!) 97.1 F (36.2 C) (Tympanic)   Wt 216 lb (98 kg)   BMI 39.51 kg/m  Well-developed well-nourished patient in NAD. HEENT reveals PERLA, EOMI, discs not visualized.  Oral cavity is clear. No oral mucosal lesions are identified. Neck is clear without evidence of cervical or supraclavicular adenopathy. Lungs are clear to A&P. Cardiac examination is essentially unremarkable with regular rate and rhythm without murmur rub or thrill. Abdomen is benign with no organomegaly or masses noted. Motor sensory and DTR levels are equal and symmetric in the upper and lower extremities. Cranial nerves II through XII are grossly intact.  Proprioception is intact. No peripheral adenopathy or edema is identified. No motor or sensory levels are noted. Crude visual fields are within normal range.  RADIOLOGY RESULTS: Mammograms reviewed compatible with above-stated findings  PLAN: Present time patient continues to do well with no evidence of disease now out over 2-1/2 years.  I will turn follow-up care over to GYN oncology as well as GYN.  We will be happy to reevaluate the patient anytime should further problems develop.  Patient knows to call with any concerns.  I would like to take this opportunity to thank you for allowing me to participate in the care of your patient.Noreene Filbert, MD

## 2021-03-18 ENCOUNTER — Inpatient Hospital Stay: Payer: Medicare Other | Attending: Obstetrics and Gynecology | Admitting: Nurse Practitioner

## 2021-03-18 ENCOUNTER — Other Ambulatory Visit: Payer: Self-pay

## 2021-03-18 VITALS — BP 151/91 | HR 65 | Temp 97.2°F | Resp 20 | Wt 207.0 lb

## 2021-03-18 DIAGNOSIS — Z79899 Other long term (current) drug therapy: Secondary | ICD-10-CM | POA: Diagnosis not present

## 2021-03-18 DIAGNOSIS — Z8041 Family history of malignant neoplasm of ovary: Secondary | ICD-10-CM | POA: Diagnosis not present

## 2021-03-18 DIAGNOSIS — Z7901 Long term (current) use of anticoagulants: Secondary | ICD-10-CM | POA: Insufficient documentation

## 2021-03-18 DIAGNOSIS — Z86711 Personal history of pulmonary embolism: Secondary | ICD-10-CM | POA: Insufficient documentation

## 2021-03-18 DIAGNOSIS — F329 Major depressive disorder, single episode, unspecified: Secondary | ICD-10-CM | POA: Insufficient documentation

## 2021-03-18 DIAGNOSIS — R001 Bradycardia, unspecified: Secondary | ICD-10-CM | POA: Insufficient documentation

## 2021-03-18 DIAGNOSIS — Z87891 Personal history of nicotine dependence: Secondary | ICD-10-CM | POA: Insufficient documentation

## 2021-03-18 DIAGNOSIS — Z806 Family history of leukemia: Secondary | ICD-10-CM | POA: Insufficient documentation

## 2021-03-18 DIAGNOSIS — K5730 Diverticulosis of large intestine without perforation or abscess without bleeding: Secondary | ICD-10-CM | POA: Diagnosis not present

## 2021-03-18 DIAGNOSIS — E538 Deficiency of other specified B group vitamins: Secondary | ICD-10-CM | POA: Insufficient documentation

## 2021-03-18 DIAGNOSIS — R42 Dizziness and giddiness: Secondary | ICD-10-CM | POA: Diagnosis not present

## 2021-03-18 DIAGNOSIS — E785 Hyperlipidemia, unspecified: Secondary | ICD-10-CM | POA: Insufficient documentation

## 2021-03-18 DIAGNOSIS — E039 Hypothyroidism, unspecified: Secondary | ICD-10-CM | POA: Diagnosis not present

## 2021-03-18 DIAGNOSIS — C541 Malignant neoplasm of endometrium: Secondary | ICD-10-CM | POA: Insufficient documentation

## 2021-03-18 DIAGNOSIS — I700 Atherosclerosis of aorta: Secondary | ICD-10-CM | POA: Diagnosis not present

## 2021-03-18 DIAGNOSIS — K219 Gastro-esophageal reflux disease without esophagitis: Secondary | ICD-10-CM | POA: Diagnosis not present

## 2021-03-18 DIAGNOSIS — I2510 Atherosclerotic heart disease of native coronary artery without angina pectoris: Secondary | ICD-10-CM | POA: Diagnosis not present

## 2021-03-18 DIAGNOSIS — I10 Essential (primary) hypertension: Secondary | ICD-10-CM | POA: Diagnosis not present

## 2021-03-18 DIAGNOSIS — K449 Diaphragmatic hernia without obstruction or gangrene: Secondary | ICD-10-CM | POA: Insufficient documentation

## 2021-03-18 DIAGNOSIS — Z803 Family history of malignant neoplasm of breast: Secondary | ICD-10-CM | POA: Diagnosis not present

## 2021-03-18 DIAGNOSIS — N950 Postmenopausal bleeding: Secondary | ICD-10-CM | POA: Insufficient documentation
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
## 2021-11-18 ENCOUNTER — Inpatient Hospital Stay: Payer: Medicare Other | Attending: Obstetrics and Gynecology | Admitting: Nurse Practitioner

## 2021-11-18 ENCOUNTER — Other Ambulatory Visit: Payer: Self-pay

## 2021-11-18 VITALS — BP 141/82 | HR 54 | Temp 98.7°F | Resp 18 | Wt 208.4 lb

## 2021-11-18 DIAGNOSIS — C541 Malignant neoplasm of endometrium: Secondary | ICD-10-CM | POA: Diagnosis not present

## 2021-11-18 DIAGNOSIS — I10 Essential (primary) hypertension: Secondary | ICD-10-CM | POA: Insufficient documentation

## 2021-11-18 DIAGNOSIS — Z08 Encounter for follow-up examination after completed treatment for malignant neoplasm: Secondary | ICD-10-CM | POA: Diagnosis not present

## 2021-11-18 DIAGNOSIS — K5730 Diverticulosis of large intestine without perforation or abscess without bleeding: Secondary | ICD-10-CM | POA: Diagnosis not present

## 2021-11-18 DIAGNOSIS — Z803 Family history of malignant neoplasm of breast: Secondary | ICD-10-CM | POA: Diagnosis not present

## 2021-11-18 DIAGNOSIS — Z923 Personal history of irradiation: Secondary | ICD-10-CM | POA: Insufficient documentation

## 2021-11-18 DIAGNOSIS — E039 Hypothyroidism, unspecified: Secondary | ICD-10-CM | POA: Insufficient documentation

## 2021-11-18 DIAGNOSIS — Z8042 Family history of malignant neoplasm of prostate: Secondary | ICD-10-CM | POA: Insufficient documentation

## 2021-11-18 DIAGNOSIS — E538 Deficiency of other specified B group vitamins: Secondary | ICD-10-CM | POA: Diagnosis not present

## 2021-11-18 DIAGNOSIS — Z86711 Personal history of pulmonary embolism: Secondary | ICD-10-CM | POA: Diagnosis not present

## 2021-11-18 DIAGNOSIS — K449 Diaphragmatic hernia without obstruction or gangrene: Secondary | ICD-10-CM | POA: Diagnosis not present

## 2021-11-18 DIAGNOSIS — K219 Gastro-esophageal reflux disease without esophagitis: Secondary | ICD-10-CM | POA: Insufficient documentation

## 2021-11-18 DIAGNOSIS — I700 Atherosclerosis of aorta: Secondary | ICD-10-CM | POA: Insufficient documentation

## 2021-11-18 DIAGNOSIS — I2510 Atherosclerotic heart disease of native coronary artery without angina pectoris: Secondary | ICD-10-CM | POA: Diagnosis not present

## 2021-11-18 DIAGNOSIS — Z79899 Other long term (current) drug therapy: Secondary | ICD-10-CM | POA: Diagnosis not present

## 2021-11-18 DIAGNOSIS — E669 Obesity, unspecified: Secondary | ICD-10-CM | POA: Insufficient documentation

## 2021-11-18 DIAGNOSIS — Z8542 Personal history of malignant neoplasm of other parts of uterus: Secondary | ICD-10-CM

## 2021-11-18 DIAGNOSIS — E785 Hyperlipidemia, unspecified: Secondary | ICD-10-CM | POA: Diagnosis not present

## 2021-11-18 DIAGNOSIS — Z7901 Long term (current) use of anticoagulants: Secondary | ICD-10-CM | POA: Diagnosis not present
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
## 2021-11-18 NOTE — Patient Instructions (Signed)
Please contact Dr. Ouida Sills for appointment in 6 months (around 05/18/22) and we will see you in a year. If you developing concerning symptoms prior to appointment, contact the clinic and we will see you sooner. Thank you for allowing [place] to participate in your care.  Beckey Rutter, NP

## 2022-04-15 ENCOUNTER — Ambulatory Visit (INDEPENDENT_AMBULATORY_CARE_PROVIDER_SITE_OTHER): Payer: Medicare Other | Admitting: Pulmonary Disease

## 2022-04-15 ENCOUNTER — Ambulatory Visit
Admission: RE | Admit: 2022-04-15 | Discharge: 2022-04-15 | Disposition: A | Discharge status: Home / Self Care | Payer: Medicare Other | Source: Ambulatory Visit | Attending: Pulmonary Disease | Admitting: Pulmonary Disease

## 2022-04-15 ENCOUNTER — Encounter: Payer: Self-pay | Admitting: Pulmonary Disease

## 2022-04-15 VITALS — BP 138/74 | HR 85 | Temp 97.9°F | Ht 62.0 in | Wt 209.0 lb

## 2022-04-15 DIAGNOSIS — K219 Gastro-esophageal reflux disease without esophagitis: Secondary | ICD-10-CM | POA: Diagnosis not present

## 2022-04-15 DIAGNOSIS — R0602 Shortness of breath: Secondary | ICD-10-CM | POA: Diagnosis present
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
## 2022-04-15 NOTE — Patient Instructions (Signed)
We will order a chest x-ray, echocardiogram (heart test) and pulmonary function tests (breathing test).  This will give me a better idea of what is causing your shortness of breath.  Continue using your albuterol as needed.   Please take your Protonix (pantoprazole) daily.  Gastric reflux can cause a lot of problems with breathing and with the lungs as well.  We will see you in follow-up in 4 to 6 weeks time call sooner should any new problems arise.

## 2022-05-17 ENCOUNTER — Encounter: Payer: Medicare Other | Admitting: Primary Care

## 2022-05-17 NOTE — Patient Instructions (Signed)
Recommendations Continue Protonix Echocardiogram is scheduled for August 8  Orders:  We need to schedule pulmonary function testing

## 2022-05-17 NOTE — Progress Notes (Signed)
 This encounter was created in error - please disregard.

## 2022-05-23 ENCOUNTER — Ambulatory Visit
Admission: EM | Admit: 2022-05-23 | Discharge: 2022-05-23 | Disposition: A | Discharge status: Home / Self Care | Payer: Medicare Other | Attending: Family Medicine | Admitting: Family Medicine

## 2022-05-23 DIAGNOSIS — B370 Candidal stomatitis: Secondary | ICD-10-CM

## 2022-05-23 DIAGNOSIS — R7303 Prediabetes: Secondary | ICD-10-CM

## 2022-05-23 DIAGNOSIS — K146 Glossodynia: Secondary | ICD-10-CM

## 2022-05-23 LAB — POCT FASTING CBG KUC MANUAL ENTRY: POCT Glucose (KUC): 119 mg/dL — AB (ref 70–99)

## 2022-05-23 MED ORDER — NYSTATIN 100000 UNIT/ML MT SUSP: 500000.0000 [IU] | Freq: Four times a day (QID) | OROMUCOSAL | 0 refills | Status: DC | PRN

## 2022-05-23 MED ORDER — FLUCONAZOLE 150 MG PO TABS: 150.0000 mg | ORAL_TABLET | Freq: Every day | ORAL | 0 refills | Status: AC

## 2022-05-23 MED ORDER — LIDOCAINE VISCOUS HCL 2 % MT SOLN: 15.0000 mL | OROMUCOSAL | 0 refills | Status: DC | PRN

## 2022-05-23 NOTE — ED Triage Notes (Signed)
Pt first began to have sore throat and then had white film on tongue and has burning

## 2022-05-23 NOTE — Discharge Instructions (Addendum)
Your blood sugar is a little elevated today, 119 but not alarming level. This can cause thrush, however dry mouth and certain medication can cause thrush as well. Take medication as prescribed. Recommend follow-up with PCP within 7-14 days to ensure thrush has resolved.
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
## 2022-05-25 ENCOUNTER — Ambulatory Visit (INDEPENDENT_AMBULATORY_CARE_PROVIDER_SITE_OTHER): Payer: Medicare Other

## 2022-05-25 DIAGNOSIS — R0602 Shortness of breath: Secondary | ICD-10-CM | POA: Diagnosis not present

## 2022-05-25 HISTORY — PX: TRANSTHORACIC ECHOCARDIOGRAM: SHX275

## 2022-05-25 LAB — ECHOCARDIOGRAM COMPLETE
AR max vel: 2.31 cm2
AV Area VTI: 2.6 cm2
AV Area mean vel: 2.24 cm2
AV Mean grad: 5 mmHg
AV Peak grad: 10 mmHg
Ao pk vel: 1.58 m/s
Area-P 1/2: 1.86 cm2
S' Lateral: 2.3 cm
Single Plane A4C EF: 65 %

## 2022-06-04 ENCOUNTER — Other Ambulatory Visit: Payer: Self-pay | Admitting: Family Medicine

## 2022-06-04 ENCOUNTER — Telehealth: Payer: Self-pay | Admitting: Pulmonary Disease

## 2022-06-04 NOTE — Telephone Encounter (Signed)
Lm for patient.  

## 2022-06-07 ENCOUNTER — Institutional Professional Consult (permissible substitution): Payer: Medicare Other | Admitting: Pulmonary Disease

## 2022-06-07 NOTE — Telephone Encounter (Signed)
Lm x2 for patient Will close encounter per office protocol. Letter mailed to address on file.    

## 2022-07-28 ENCOUNTER — Ambulatory Visit (INDEPENDENT_AMBULATORY_CARE_PROVIDER_SITE_OTHER): Payer: Medicare Other | Admitting: Pulmonary Disease

## 2022-07-28 VITALS — BP 122/82 | HR 70 | Temp 97.1°F | Ht 62.0 in | Wt 209.0 lb

## 2022-07-28 DIAGNOSIS — E669 Obesity, unspecified: Secondary | ICD-10-CM

## 2022-07-28 DIAGNOSIS — I272 Pulmonary hypertension, unspecified: Secondary | ICD-10-CM

## 2022-07-28 DIAGNOSIS — R0602 Shortness of breath: Secondary | ICD-10-CM

## 2022-07-28 NOTE — Progress Notes (Signed)
Subjective:    Patient ID: Mary Meyers, female    DOB: [birth year], 76 y.o.   MRN: [id number] Patient Care Team: Dorothey Baseman, MD as PCP - General (Family Medicine) Marykay Lex, MD as PCP - Cardiology (Cardiology) Benita Gutter, RN as Registered Nurse  Chief Complaint  Patient presents with   Follow-up    SOB with exertion. No wheezing or cough.   HPI Patient is a 76 year old remote former smoker with minimal tobacco exposure in the past, who presents for follow-up on the issue of shortness of breath.  She was last seen by Buelah Manis NP on 17 May 2022.  Her workup has been difficult due to her inability to follow-up with appointments or with requested tests.  She was taking care of her very ill husband who who has end-stage COPD and cardiomyopathy.  In any event she has had PFTs ordered which she has not scheduled, she had an echocardiogram which was performed on 25 May 2022 and showed that she had an LVEF of 60 to 65%, normal left ventricular function, grade 1 diastolic dysfunction.  There was moderately increased right ventricular size no evidence of valvular disease.  Right atrial pressure was estimated to be 15 mmHg.  Because of concern for potential pulmonary hypertension overnight oximetry was ordered.  She has not completed the study. She presents today with her son.  Continues to have issues with significant shortness of breath.    We discussed with Ms. Sisk that it is to have pulmonary function testing performed as well as overnight oximetry study we can better determine what the cause of her dyspnea is.     She continues to have dyspnea as noted above.  No increase or change from prior.  No cough, no sputum production.  No hemoptysis.  No lower extremity edema   Review of Systems A 10 point review of systems was performed and it is as noted above otherwise negative.    Objective:   Physical Exam BP 122/82 (BP Location: Left Arm, Cuff Size: Large)    Pulse 70   Temp (!) 97.1 F (36.2 C)   Ht  (1.575 m)   Wt 209 lb (94.8 kg) Comment: patient in wheelchair. this is the last recorded weight  SpO2 92%   BMI 38.23 kg/m   GENERAL: Obese woman, no acute distress, no conversational dyspnea, fully ambulatory. HEAD: Normocephalic, atraumatic.  EYES: Pupils equal, round, reactive to light.  No scleral icterus.  MOUTH: Nose/mouth/throat not examined due to institutional masking requirements. NECK: Supple. No thyromegaly. Trachea midline. No JVD.  No adenopathy. PULMONARY: Good air entry bilaterally.  No adventitious sounds. CARDIOVASCULAR: S1 and S2. Regular rate and rhythm.  ABDOMEN: Obese otherwise, benign. MUSCULOSKELETAL: No joint deformity, no clubbing, no edema.  NEUROLOGIC: No overt focal deficit, no gait disturbance, speech is fluent. SKIN: Intact,warm,dry. PSYCH: Preoccupied, normal behavior.      Assessment & Plan:     ICD-10-CM   1. SOB (shortness of breath)  R06.02 Pulse oximetry, overnight    Pulmonary Function Test [facility] Only   Ordered PFTs Order overnight oximetry    2. Pulmonary hypertension - possible  I27.20    Needs studies ordered to better characterize    3. Obesity, Class II, BMI 35-39.9  E66.9    This issue adds complexity to her management May be aggravating shortness of breath     Orders Placed This Encounter  Procedures   Pulse oximetry, overnight    On  roomair. DME:new start    Standing Status:   Future    Standing Expiration Date:   07/29/2023   Pulmonary Function Test [facility] Only    Next available.    Standing Status:   Future    Standing Expiration Date:   07/29/2023    Order Specific Question:   Full PFT: includes the following: basic spirometry, spirometry pre & post bronchodilator, diffusion capacity (DLCO), lung volumes    Answer:   Full PFT   We have rescheduled the studies that the patient has not realized yet. Follow-up in 2 to 3 months time she is to contact us prior to that time  should any new difficulties arise.  Gailen Shelter, MD Advanced Bronchoscopy [facility]-    *This note was dictated using voice recognition software/Dragon.  Despite best efforts to proofread, errors can occur which can change the meaning. Any transcriptional errors that result from this process are unintentional and may not be fully corrected at the time of dictation.

## 2022-07-28 NOTE — Patient Instructions (Signed)
We are going to reschedule the pulmonary function tests this will tell me more about your lungs.  We are going to schedule an overnight oximetry test this will be done by third-party company [employer], and/or [employer]) they will call you to set this at home.  We will see you in follow-up in 2 to 3 months time.   Vamos a reordenar las pruebas de funcin pulmonar. Esto me dir ms sobre sus pulmones.  Vamos a programar una prueba de oximetra nocturna que ser realizada por una empresa externa [employer] y/o [employer]) y la llamarn para programarla en casa. Es una prueba sencilla.  Nos veremos en seguimiento dentro de 2 a 3 meses.

## 2022-07-30 ENCOUNTER — Ambulatory Visit
Admission: RE | Admit: 2022-07-30 | Discharge: 2022-07-30 | Disposition: A | Discharge status: Home / Self Care | Payer: Medicare Other | Source: Ambulatory Visit | Attending: Physician Assistant | Admitting: Physician Assistant

## 2022-07-30 ENCOUNTER — Other Ambulatory Visit: Payer: Self-pay | Admitting: Physician Assistant

## 2022-07-30 DIAGNOSIS — W19XXXA Unspecified fall, initial encounter: Secondary | ICD-10-CM | POA: Insufficient documentation

## 2022-07-30 DIAGNOSIS — M546 Pain in thoracic spine: Secondary | ICD-10-CM | POA: Insufficient documentation

## 2022-07-30 DIAGNOSIS — I700 Atherosclerosis of aorta: Secondary | ICD-10-CM | POA: Insufficient documentation

## 2022-08-09 ENCOUNTER — Telehealth: Payer: Self-pay | Admitting: Pulmonary Disease

## 2022-08-09 NOTE — Telephone Encounter (Signed)
Dr. Patsey Berthold has reviewed the patient's ONO they showed her oxygen level goes down to as low as 68%. She has significant fluctuations. She recommends an inlab sleep study (split-night).  I left a message for the patient to return my call.

## 2022-08-10 NOTE — Telephone Encounter (Signed)
I have left another message for the patient to return my call. 

## 2022-08-11 NOTE — Telephone Encounter (Signed)
I have tired to contact the patient again and left another message. I have mailed her a letter as well. I will wait for her to contact our office.

## 2022-09-28 ENCOUNTER — Ambulatory Visit: Payer: Medicare Other | Admitting: Pulmonary Disease

## 2022-11-02 ENCOUNTER — Encounter: Payer: Self-pay | Admitting: Cardiology

## 2022-11-04 ENCOUNTER — Encounter: Payer: Self-pay | Admitting: Cardiology

## 2022-11-04 ENCOUNTER — Ambulatory Visit: Payer: Medicare Other | Attending: Cardiology | Admitting: Cardiology

## 2022-11-04 ENCOUNTER — Other Ambulatory Visit
Admission: RE | Admit: 2022-11-04 | Discharge: 2022-11-04 | Disposition: A | Discharge status: Home / Self Care | Payer: Medicare Other | Source: Ambulatory Visit | Attending: Cardiology | Admitting: Cardiology

## 2022-11-04 VITALS — BP 148/74 | HR 65 | Ht 62.0 in | Wt 209.6 lb

## 2022-11-04 DIAGNOSIS — E782 Mixed hyperlipidemia: Secondary | ICD-10-CM | POA: Diagnosis not present

## 2022-11-04 DIAGNOSIS — R072 Precordial pain: Secondary | ICD-10-CM

## 2022-11-04 DIAGNOSIS — I10 Essential (primary) hypertension: Secondary | ICD-10-CM

## 2022-11-04 DIAGNOSIS — R0609 Other forms of dyspnea: Secondary | ICD-10-CM | POA: Insufficient documentation

## 2022-11-04 LAB — BASIC METABOLIC PANEL
Anion gap: 9 (ref 5–15)
BUN: 24 mg/dL — ABNORMAL HIGH (ref 8–23)
CO2: 25 mmol/L (ref 22–32)
Calcium: 9.1 mg/dL (ref 8.9–10.3)
Chloride: 103 mmol/L (ref 98–111)
Creatinine, Ser: 0.88 mg/dL (ref 0.44–1.00)
GFR, Estimated: >60 mL/min (ref >60)
Glucose, Bld: 115 mg/dL — ABNORMAL HIGH (ref 70–99)
Potassium: 3.3 mmol/L — ABNORMAL LOW (ref 3.5–5.1)
Sodium: 137 mmol/L (ref 135–145)

## 2022-11-04 MED ORDER — PREDNISONE 50 MG PO TABS: ORAL_TABLET | ORAL | 0 refills | Status: DC

## 2022-11-04 MED ORDER — DIPHENHYDRAMINE HCL 50 MG PO CAPS: ORAL_CAPSULE | ORAL | 0 refills | Status: DC

## 2022-11-04 MED ORDER — METOPROLOL TARTRATE 100 MG PO TABS: 100.0000 mg | ORAL_TABLET | Freq: Once | ORAL | 0 refills | Status: DC

## 2022-11-04 NOTE — Patient Instructions (Addendum)
Medication Instructions:  See instructions for medications with testing.  *If you need a refill on your cardiac medications before your next appointment, please call your pharmacy*   Lab Work: [facility] today over at the [facility]. Check in at registration to check in.   If you have labs (blood work) drawn today and your tests are completely normal, you will receive your results only by: [place] (if you have MyChart) OR A paper copy in the mail If you have any lab test that is abnormal or we need to change your treatment, we will call you to review the results.   Testing/Procedures:   Your cardiac CT will be scheduled at one of the below locations:    [facility] [place], [place] [address] [phone]  Scheduled to be done on 11/22/21 at 10:15 am. Please arrive at 10:00 am to check in and test prep.   Please arrive at the [facility] entrance and stop at the registration desk for them to get you checked in.    Please follow these instructions carefully (unless otherwise directed):  Hold all erectile dysfunction medications at least 3 days (72 hrs) prior to test. (Ie viagra, cialis, sildenafil, tadalafil, etc) We will administer nitroglycerin during this exam.   On the Night Before the Test: Be sure to Drink plenty of water. Do not consume any caffeinated/decaffeinated beverages or chocolate 12 hours prior to your test. Do not take any antihistamines 12 hours prior to your test. If the patient has contrast allergy: Patient will need a prescription for Prednisone and very clear instructions (as follows): Prednisone 50 mg - take 13 hours prior to test Take another Prednisone 50 mg 7 hours prior to test Take another Prednisone 50 mg 1 hour prior to test Take Benadryl 50 mg 1 hour prior to test TAKE another Prednisone 50 mg 1 hour after your test.  Patient must complete all doses of above prophylactic  medications. Patient will need a ride after test due to Benadryl.  On the Day of the Test: Drink plenty of water until 1 hour prior to the test. Do not eat any food 1 hour prior to test. You may take your regular medications prior to the test.  Take metoprolol (Lopressor) 100 mg  two hours prior to test. HOLD Furosemide/Hydrochlorothiazide morning of the test. FEMALES- please wear underwire-free bra if available, avoid dresses & tight clothing     After the Test: Drink plenty of water. After receiving IV contrast, you may experience a mild flushed feeling. This is normal. On occasion, you may experience a mild rash up to 24 hours after the test. This is not dangerous. If this occurs, you can take Benadryl 25 mg and increase your fluid intake. If you experience trouble breathing, this can be serious. If it is severe call 911 IMMEDIATELY. If it is mild, please call our office. If you take any of these medications: Glipizide/Metformin, Avandament, Glucavance, please do not take 48 hours after completing test unless otherwise instructed.  We will call to schedule your test 2-4 weeks out understanding that some insurance companies will need an authorization prior to the service being performed.   For non-scheduling related questions, please contact the cardiac imaging nurse navigator should you have any questions/concerns: Marchia Bond, Cardiac Imaging Nurse Navigator Gordy Clement, Cardiac Imaging Nurse Navigator  [facility] Direct Office Dial: [phone]   For scheduling needs, including cancellations and rescheduling, please call  [place], [phone].    Follow-Up: At [facility], you and your health needs are our priority.  As part of our continuing mission to provide you with exceptional heart care, we have created designated Provider Care Teams.  These Care Teams include your primary Cardiologist (physician) and Advanced Practice Providers  (APPs -  Physician Assistants and Nurse Practitioners) who all work together to provide you with the care you need, when you need it.   Your next appointment:   1 month(s)  Provider:   You may see Dr. Glenetta Hew or one of the following Advanced Practice Providers on your designated Care Team:   Murray Hodgkins, NP Christell Faith, PA-C Cadence Kathlen Mody, PA-C Gerrie Nordmann, NP
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
## 2022-11-05 ENCOUNTER — Encounter: Payer: Self-pay | Admitting: Cardiology

## 2022-11-05 DIAGNOSIS — R0609 Other forms of dyspnea: Secondary | ICD-10-CM | POA: Insufficient documentation

## 2022-11-05 DIAGNOSIS — R072 Precordial pain: Secondary | ICD-10-CM | POA: Insufficient documentation

## 2022-11-05 NOTE — Assessment & Plan Note (Signed)
Somewhat progressively worsening symptoms.  Initially she was having this level of dyspnea while in [place], but now she is having the same amount of dys[place].  Walking only 20-30 steps without dyspnea that is somewhat limiting is concerning.  Echo showed normal LVEF with only grade 1 diastolic dysfunction which is expected for age.  Interestingly, the RV was dilated and the RAP was estimated to be 15 mmHg.  She does not really have significant based on pulmonary disease besides having had a PE but that was distant.  My understanding is that pulmonary would like for this to be evaluated, and the best way to evaluate pulmonary pressures would be a right heart cath.  She does have PFTs done but not formally read, and has a sleep study pending as well which could also shed light on the RV dilation.  For now I would like to exclude ischemic CAD since she is having chest pain.  We will do so with a coronary CTA.  Pending results would recommend either right and left heart cath versus simply right heart cath for further evaluation.

## 2022-11-05 NOTE — Assessment & Plan Note (Signed)
LDL is 103, she is currently taking atorvastatin 40 mg daily.  Does not seem to be doing a great job controlling it.  Thankfully, her heart cath back in 2019 was relatively reassuring, but now she is having recurrent chest pain.  Pending results of Coronary CTA, may need to be more aggressive.

## 2022-11-05 NOTE — Assessment & Plan Note (Signed)
She clearly has some whitecoat component of her hypertension.  142/70 was not that bad, but she has had blood pressure recordings all over the place.  We can reassess at follow-up.  She is only on Maxide, and we may want to adjust her medications based on echocardiogram findings and Coronary CTA results.

## 2022-11-05 NOTE — Assessment & Plan Note (Addendum)
In addition to her exertional dyspnea, she is now noticing 2 different types of precordial pain 1 that seems to be associated with her dyspnea but a second symptom that is associated at rest.  1 feels like a more sharp discomfort and the other is more of a pressure.  Difficult with language barrier to see which 1 is truly which.  Need to exclude ischemic CAD even though she had a relatively normal cath back in 2019 simply because of her risk factors.  Based on the echocardiogram results, will we may want to be considering a right heart cath that I would like to know whether not she would benefit from right and left heart cath.  Plan: Check Coronary CTA, and pending results, may recommend right and left heart cath versus of the right heart cath.  This will also help [place] titrate lipid management. Hold off on beta-blocker pending results.  She has a listed allergy/hypersensitivity to contrast medium.  Will premedicate with prednisone 13, 7 and 1 hour prior to the CTA and fourth dose 6 hours after.  She will also get 1 dose of Benadryl. Lopressor 100 mg prior to CTA. Chemistry panel checked

## 2022-11-11 ENCOUNTER — Ambulatory Visit (INDEPENDENT_AMBULATORY_CARE_PROVIDER_SITE_OTHER): Payer: Medicare Other | Admitting: Pulmonary Disease

## 2022-11-11 ENCOUNTER — Encounter: Payer: Self-pay | Admitting: Pulmonary Disease

## 2022-11-11 VITALS — BP 140/80 | HR 72 | Temp 97.8°F | Ht 62.0 in | Wt 208.0 lb

## 2022-11-11 DIAGNOSIS — Z9189 Other specified personal risk factors, not elsewhere classified: Secondary | ICD-10-CM

## 2022-11-11 DIAGNOSIS — R0602 Shortness of breath: Secondary | ICD-10-CM | POA: Diagnosis not present

## 2022-11-11 DIAGNOSIS — G4734 Idiopathic sleep related nonobstructive alveolar hypoventilation: Secondary | ICD-10-CM

## 2022-11-11 NOTE — Patient Instructions (Addendum)
We are going to get a sleep study and pulmonary function tests.  These will be scheduled at [facility] in [place].  We will see on follow-up in 6 to 8 weeks time call sooner should any new problems arise.  Le vamos a hacer un estudio del dormir y pruebas de funcin pulmonar. Estos se programarn en [employer].  Veremos el seguimiento dentro de 6 a 8 semanas y llamenos antes si surge algn problema nuevo.

## 2022-11-11 NOTE — Progress Notes (Signed)
Subjective:    Patient ID: Mary Meyers, female    DOB: Aug 11, 1946, 77 y.o.   MRN: [id number] Patient Care Team: Juluis Pitch, MD as PCP - General (Family Medicine) Clent Jacks, RN as Registered Nurse  Chief Complaint  Patient presents with   Follow-up    SOB with exertion.    HPI Patient is a 77 year old remote former smoker with minimal tobacco exposure in the past, who presents for follow-up on the issue of shortness of breath.  She was last seen by me on 28 July 2022.  Her workup has been difficult due to her inability to follow-up with appointments or with requested tests.  She was taking care of her very ill husband who passed away on 15-Sep-2023 and she is understandably, distraught over this.  In any event she has had PFTs ordered which she has not scheduled, she had an echocardiogram which was performed on 25 May 2022 and showed that she had an LVEF of 60 to 65%, normal left ventricular function, grade 1 diastolic dysfunction.  There was moderately increased right ventricular size no evidence of valvular disease.  Right atrial pressure was estimated to be 15 mmHg.  Because of concern for potential pulmonary hypertension overnight oximetry was ordered.  She finally completed the study on 04 August 2022.  She had oxygen saturations nadir of 68% and significant fluctuations and it was recommended that she have a split-night sleep study.  We attempted to relay these findings to the patient however she never responded to phone calls and a letter was sent to her home.  However, this was during the time that her husband's health was declining prior to his death.  She presents today with her son.  Continues to have issues with significant shortness of breath.  She is being evaluated by cardiology for some issues that she has been having with chest pain that may be related to coronary artery disease.  We discussed with Mary Meyers that it is to have pulmonary function testing  performed as well as a sleep study and this has to be in lab.  Her son who is with her today request that the testing be done in [place] that way he can support her through these visits.  We will order these to be done at [facility].  Ultimately, she may also need a right heart cath.  She continues to have dyspnea as noted above.  No increase or change from prior.  No cough, no sputum production.  No hemoptysis.  No lower extremity edema.   Review of Systems A 10 point review of systems was performed and it is as noted above otherwise negative.  Patient Active Problem List   Diagnosis Date Noted   Precordial pain 11/05/2022   Exertional dyspnea 11/05/2022   PMB (postmenopausal bleeding) 03/18/2021   B12 deficiency 11/21/2020   Elevated glucose 11/21/2020   Vertigo 04/09/2019   History of pulmonary embolism 01/20/2018   Endometrial cancer ([place]) 12/13/2017   S/P TAH-BSO 11/14/2017   Hypertension 11/01/2017   Bradycardia 10/03/2017   Osteoarthritis of knee 02/07/2017   Tremor 10/29/2016   Arthritis 05/17/2016   Depression 05/17/2016   Gastroesophageal reflux disease without esophagitis 05/17/2016   Acquired hypothyroidism 09/22/2015   Hyperlipidemia 09/22/2015   Cough 03/27/2013   Abnormal chest CT 03/27/2013   Social History   Tobacco Use   Smoking status: Former    Types: Cigarettes    Quit date: 10/19/1971    Years since  quitting: 51.0   Smokeless tobacco: Never  Substance Use Topics   Alcohol use: Yes    Comment: occ. wine with social gatherings   Allergies  Allergen Reactions   Cortisone Hives and Other (See Comments)    Burning/swelling.   Iodinated Contrast Media Other (See Comments)    Burning/swelling.   Tramadol Nausea And Vomiting and Other (See Comments)    dizziness   Current Meds  Medication Sig   albuterol (VENTOLIN HFA) 108 (90 Base) MCG/ACT inhaler Inhale 2 puffs into the lungs every 6 (six) hours as needed.    apixaban (ELIQUIS) 5 MG TABS tablet  Take 5 mg by mouth 2 (two) times daily. Pt is instructed to take when traveling by plane more than 5 hours   aspirin EC 81 MG tablet Take 81 mg by mouth daily. Swallow whole.   atorvastatin (LIPITOR) 40 MG tablet Take 40 mg by mouth daily.   buPROPion (WELLBUTRIN XL) 150 MG 24 hr tablet Take 150 mg by mouth daily.   celecoxib (CELEBREX) 200 MG capsule Take 200 mg by mouth daily.   cetirizine (ZYRTEC) 10 MG tablet Take 10 mg by mouth daily.   diphenhydrAMINE (BENADRYL) 50 MG capsule Take one capsule 1 hour prior to scan.   furosemide (LASIX) 20 MG tablet Take 20 mg by mouth.   levothyroxine (SYNTHROID, LEVOTHROID) 100 MCG tablet Take 100 mcg by mouth daily before breakfast.   lidocaine (XYLOCAINE) 2 % solution Use as directed 15 mLs in the mouth or throat every 3 (three) hours as needed for mouth pain.   meclizine (ANTIVERT) 25 MG tablet Take 25 mg by mouth 3 (three) times daily as needed (for vertigo).   metroNIDAZOLE (METROGEL) 0.75 % gel    Multiple Vitamins-Minerals (PX COMPLETE SENIOR MULTIVITS) TABS Take by mouth.   nystatin (MYCOSTATIN) 100000 UNIT/ML suspension Take 5 mLs (500,000 Units total) by mouth 4 (four) times daily as needed (mix with 15 mls of lidocaine viscous, gargle, swish, for one minute then spit every 6 hours as needed for oral thrush).   pantoprazole (PROTONIX) 40 MG tablet Take 40 mg by mouth 2 (two) times daily.    potassium chloride (KLOR-CON) 10 MEQ tablet Take by mouth. Take 1 tablet (10 mEq total) by mouth once daily   predniSONE (DELTASONE) 50 MG tablet Take one tablet 13 hours, 7 hours, and 1 hour prior to scan.   predniSONE (DELTASONE) 50 MG tablet Take 1 tablet one hour after your testing.   sucralfate (CARAFATE) 1 g tablet Take 1 g by mouth as needed.    triamcinolone cream (KENALOG) 0.1 % Apply 1 application topically 2 (two) times daily as needed.   triamcinolone cream (KENALOG) 0.5 % Apply 1 application topically daily as needed (applied to dry/rough area of  elbows.).   triamterene-hydrochlorothiazide (MAXZIDE) 75-50 MG per tablet Take 0.5 tablets by mouth daily.    venlafaxine XR (EFFEXOR-XR) 150 MG 24 hr capsule Take 150 mg by mouth daily.   Immunization History  Administered Date(s) Administered   Influenza Split 10/23/2014   Influenza, High Dose Seasonal PF 06/24/2017, 07/07/2018   Influenza,inj,Quad PF,6+ Mos 08/07/2019   Influenza,inj,quad, With Preservative 07/17/2018   Influenza-Unspecified 10/23/2014, 10/25/2016, 06/24/2017, 07/08/2020, 08/04/2021   PFIZER Comirnaty(Gray Top)Covid-19 Tri-Sucrose Vaccine 11/03/2019, 11/27/2019, 09/16/2020, 03/17/2021   Pneumococcal Conjugate-13 04/14/2018   Pneumococcal Polysaccharide-23 04/09/2019   Tdap 04/06/2018   Unspecified SARS-COV-2 Vaccination 07/03/2021       Objective:   Physical Exam BP (!) 140/80 (BP Location: Left Arm,  Cuff Size: Large)   Pulse 72   Temp 97.8 F (36.6 C) (Temporal)   Ht '5\' 2"'$  (1.575 m)   Wt 208 lb (94.3 kg)   SpO2 95%   BMI 38.04 kg/m   SpO2: 95 % O2 Device: None (Room air)  GENERAL: Obese woman, no acute distress, no conversational dyspnea, fully ambulatory. HEAD: Normocephalic, atraumatic.  EYES: Pupils equal, round, reactive to light.  No scleral icterus.  MOUTH: Nose/mouth/throat not examined due to institutional masking requirements. NECK: Supple. No thyromegaly. Trachea midline. No JVD.  No adenopathy. PULMONARY: Good air entry bilaterally.  No adventitious sounds. CARDIOVASCULAR: S1 and S2. Regular rate and rhythm.  ABDOMEN: Obese otherwise, benign. MUSCULOSKELETAL: No joint deformity, no clubbing, no edema.  NEUROLOGIC: No overt focal deficit, no gait disturbance, speech is fluent. SKIN: Intact,warm,dry. PSYCH: Tearful, anxious.     Assessment & Plan:     ICD-10-CM   1. SOB (shortness of breath)  R06.02 Pulmonary function test   Needs to have the requested studies done She will need PFTs Undergoing cardiac workup as well    2. At risk  for sleep apnea  Z91.89 Split night study   Split-night sleep study ordered    3. 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    Answer:   [facility]    Order Specific Question:   Full PFT: includes the following: basic spirometry, spirometry pre & post bronchodilator, diffusion capacity (DLCO), lung volumes    Answer:   Full PFT   Split night study    Standing Status:   Future    Standing Expiration Date:   11/12/2023    Order Specific Question:   Where should this test be performed:    Answer:   [facility]   Mary Meyers has had issues with shortness of breath that have not been worked up fully yet due to studies request that not being done.  She had been preoccupied with her husband's terminal illness and this was causing a toll on her.  We discussed today that the best plan for her would be to have her testing done in [place] as her sons live in [place] and can support her through the testing and ensure that she is following up on the needed testing.  She has been scheduled for PFTs and split-night sleep study at [facility].  Her echocardiogram performed in August is highly suspicious for pulmonary hypertension and she may benefit from right heart cath.  She is currently undergoing evaluation by cardiology for chest pain and will discuss with cardiology with regards to potential right heart cath.  Will see the patient in follow-up in 6 to 8 weeks time she is to call sooner should any new problems arise.  Renold Don, MD Advanced Bronchoscopy PCCM  [facility]    *This note was dictated using voice recognition software/Dragon.  Despite best  efforts to proofread, errors can occur which can change the meaning. Any transcriptional errors that result from this process are unintentional and may not be fully corrected at the time of dictation.

## 2022-11-12 ENCOUNTER — Telehealth (HOSPITAL_COMMUNITY): Payer: Self-pay | Admitting: Emergency Medicine

## 2022-11-12 NOTE — Telephone Encounter (Signed)
Attempted to call patient regarding upcoming cardiac CT appointment. Left message on voicemail with name and callback number Marchia Bond RN Navigator Cardiac Section Heart and Vascular Services [phone] Office [phone] Cell

## 2022-11-15 ENCOUNTER — Ambulatory Visit
Admission: RE | Admit: 2022-11-15 | Discharge: 2022-11-15 | Disposition: A | Discharge status: Home / Self Care | Payer: Medicare Other | Source: Ambulatory Visit | Attending: Cardiology | Admitting: Cardiology

## 2022-11-15 DIAGNOSIS — R072 Precordial pain: Secondary | ICD-10-CM | POA: Insufficient documentation

## 2022-11-15 MED ORDER — NITROGLYCERIN 0.4 MG SL SUBL
0.8000 mg | SUBLINGUAL_TABLET | Freq: Once | SUBLINGUAL | Status: AC
  Administered 2022-11-15: 0.4 mg via SUBLINGUAL

## 2022-11-15 MED ORDER — DILTIAZEM HCL 25 MG/5ML IV SOLN
5.0000 mg | Freq: Once | INTRAVENOUS | Status: AC
  Administered 2022-11-15: 5 mg via INTRAVENOUS
  Filled 2022-11-15: qty 5

## 2022-11-15 MED ORDER — IOHEXOL 350 MG/ML SOLN
100.0000 mL | Freq: Once | INTRAVENOUS | Status: AC | PRN
  Administered 2022-11-15: 100 mL via INTRAVENOUS

## 2022-11-15 NOTE — Progress Notes (Signed)
Patient tolerated procedure well. Ambulate w/o difficulty. Denies any lightheadedness or being dizzy. Pt denies any pain at this time. Sitting in chair, pt is encouraged to drink additional water throughout the day and reason explained to patient. Patient verbalized understanding and all questions answered. ABC intact. No further needs at this time. Discharge from procedure area w/o issues.  

## 2022-11-16 ENCOUNTER — Encounter (HOSPITAL_COMMUNITY): Payer: Self-pay | Admitting: Emergency Medicine

## 2022-11-16 ENCOUNTER — Telehealth (HOSPITAL_COMMUNITY): Payer: Self-pay | Admitting: Emergency Medicine

## 2022-11-16 HISTORY — PX: CT CTA CORONARY W/CA SCORE W/CM &/OR WO/CM: HXRAD787

## 2022-11-16 MED ORDER — PREDNISONE 50 MG PO TABS: ORAL_TABLET | ORAL | 0 refills | Status: DC

## 2022-11-16 NOTE — Telephone Encounter (Signed)
Pt calling reporting itching, hives, some swelling to her face after contrasted coronary ct scan yesterday at [facility]. Pt states she took the 13 hr prep ('50mg'$  prednisone 13, 7, 1hr + '50mg'$  benadryl 1 hr ) prior to scan but still having a break thru reaction. I have instructed her to go to an urgent care or emergency department for treatment. Pt denies shortness of breath and speaks in full sentences on the phone.    I have updated her allergy list to note the break thru reaction and informed her she shouldn't get contrast again.   Marchia Bond RN Navigator Cardiac Imaging [facility] Heart and Vascular Services [phone] Office  [phone] Cell

## 2022-11-16 NOTE — Addendum Note (Signed)
Addended by: Alvis Lemmings C on: 11/16/2022 01:59 PM   Modules accepted: Orders

## 2022-11-16 NOTE — Telephone Encounter (Signed)
Was notified that the patient had rash and itching 1 day post Coronary CTA despite premedication with prednisone and Benadryl.  She is having no respiratory issues.  I personally called her and talked to her and she is talking fine.  She does have a rash that was itchy.  No scratchy throat etc.  Recommendation is to complete another cycle of prednisone plus Benadryl and Pepcid as indicated.  I will contact her later on this evening to review test results and confirm that she is doing okay.  She understands if she has any issues with dyspnea or throat congestion, that she needs to go directly to the ER.  Glenetta Hew c

## 2022-11-16 NOTE — Telephone Encounter (Addendum)
Secure chat message received from Lisabeth Pick, CT nurse navigator & Dr. Ellyn Hack regarding the patient's post Cardiac CT dye reaction.  Per Dr. Ellyn Hack: Just talked to her - we can call in 4 more doses of Prednisone if she is feeling OK - if SOB then must go to ER. I am not sure how I do the Rx - but was going ot do 4 more of the 50 mg Prednisone q6 hrs.   Benadryl 25 mg & Pepcid 20 mg q 12 hr until rash gone. - Maybe just to let her know that it is @ pharmacy & if she gets SOB - fo to ER.   Attempted to call the patient to let her know that her Prednisone is being sent to the pharmacy. Advised she will also need OTC Benadryl and Pepcid. I left a message stating I will send detailed instructions to her MyChart, but was checking to check on her symptoms as well. I asked that she reach back out through her MyChart to confirm receipt of instructions and how she is doing.   Prior to sending in prednisone RX- reached out to Dr. Ellyn Hack as a flag went up that this may be contraindicated due to cortisone causing hives for her.  Per Dr. Ellyn Hack "have never heart of steroids "causing" hives- she took the prednisone pre-med doses and did ok. May have been a reaction to cortisone injection or something."  Prednisone sent to the patient's pharmacy.  MyChart message sent to the patient.

## 2022-11-16 NOTE — Telephone Encounter (Signed)
Pt calling reporting itching, hives, some swelling to her face after contrasted coronary ct scan yesterday at [facility]. Pt states she took the 13 hr prep ('50mg'$  prednisone 13, 7, 1hr + '50mg'$  benadryl 1 hr ) prior to scan but still having a break thru reaction. I have instructed her to go to an urgent care or emergency department for treatment.   I have updated her allergy list to note the break thru reaction and informed her she shouldn't get contrast again.  Marchia Bond RN Navigator Cardiac Imaging [facility] Heart and Vascular Services [phone] Office  [phone] Cell

## 2022-11-16 NOTE — Progress Notes (Signed)
Pts son called to confirm receipt of mychart message ("Dye Reaction") and understanding of medications prescribed to pharmacy and directions on treatment if medications are unsuccessful. Marchia Bond RN Navigator Cardiac Imaging [facility] Heart and Vascular Services [phone] Office  [phone] Cell

## 2022-11-17 NOTE — Progress Notes (Signed)
Calling patient to check on status of dye allergy. Pt states her swelling has decreased but still with a red, itchy rash to her face.   Marchia Bond RN Navigator Cardiac Imaging [facility] Heart and Vascular Services [phone] Office  [phone] Cell

## 2022-11-19 ENCOUNTER — Ambulatory Visit (HOSPITAL_COMMUNITY)
Admission: RE | Admit: 2022-11-19 | Discharge: 2022-11-19 | Disposition: A | Discharge status: Home / Self Care | Payer: Medicare Other | Source: Ambulatory Visit | Attending: Pulmonary Disease | Admitting: Pulmonary Disease

## 2022-11-19 DIAGNOSIS — R0602 Shortness of breath: Secondary | ICD-10-CM | POA: Insufficient documentation

## 2022-11-19 LAB — PULMONARY FUNCTION TEST
DL/VA % pred: 102 %
DL/VA: 4.27 ml/min/mmHg/L
DLCO unc % pred: 102 %
DLCO unc: 18.26 ml/min/mmHg
FEF 25-75 Post: 2.12 L/sec
FEF 25-75 Pre: 1.57 L/sec
FEF2575-%Change-Post: 35 %
FEF2575-%Pred-Post: 141 %
FEF2575-%Pred-Pre: 104 %
FEV1-%Change-Post: 6 %
FEV1-%Pred-Post: 108 %
FEV1-%Pred-Pre: 101 %
FEV1-Post: 2.04 L
FEV1-Pre: 1.91 L
FEV1FVC-%Change-Post: 5 %
FEV1FVC-%Pred-Pre: 100 %
FEV6-%Change-Post: 2 %
FEV6-%Pred-Post: 107 %
FEV6-%Pred-Pre: 104 %
FEV6-Post: 2.58 L
FEV6-Pre: 2.52 L
FEV6FVC-%Change-Post: 0 %
FEV6FVC-%Pred-Post: 105 %
FEV6FVC-%Pred-Pre: 104 %
FVC-%Change-Post: 1 %
FVC-%Pred-Post: 101 %
FVC-%Pred-Pre: 100 %
FVC-Post: 2.58 L
FVC-Pre: 2.54 L
Post FEV1/FVC ratio: 79 %
Post FEV6/FVC ratio: 100 %
Pre FEV1/FVC ratio: 75 %
Pre FEV6/FVC Ratio: 99 %
RV % pred: 83 %
RV: 1.84 L
TLC % pred: 91 %
TLC: 4.35 L

## 2022-11-19 MED ORDER — ALBUTEROL SULFATE (2.5 MG/3ML) 0.083% IN NEBU
2.5000 mg | INHALATION_SOLUTION | Freq: Once | RESPIRATORY_TRACT | Status: AC
  Administered 2022-11-19: 2.5 mg via RESPIRATORY_TRACT

## 2022-11-22 ENCOUNTER — Ambulatory Visit: Admission: RE | Admit: 2022-11-22 | Payer: Medicare Other | Source: Ambulatory Visit

## 2022-11-24 ENCOUNTER — Ambulatory Visit: Payer: Medicare Other

## 2022-11-28 ENCOUNTER — Ambulatory Visit (HOSPITAL_BASED_OUTPATIENT_CLINIC_OR_DEPARTMENT_OTHER): Payer: Medicare Other | Attending: Pulmonary Disease | Admitting: Pulmonary Disease

## 2022-11-28 DIAGNOSIS — G4733 Obstructive sleep apnea (adult) (pediatric): Secondary | ICD-10-CM | POA: Insufficient documentation

## 2022-11-28 DIAGNOSIS — Z9189 Other specified personal risk factors, not elsewhere classified: Secondary | ICD-10-CM | POA: Diagnosis not present

## 2022-12-01 ENCOUNTER — Inpatient Hospital Stay: Payer: Medicare Other | Attending: Obstetrics and Gynecology | Admitting: Nurse Practitioner

## 2022-12-01 VITALS — BP 140/84 | HR 70 | Temp 98.6°F | Resp 20 | Wt 211.0 lb

## 2022-12-01 DIAGNOSIS — R001 Bradycardia, unspecified: Secondary | ICD-10-CM | POA: Diagnosis not present

## 2022-12-01 DIAGNOSIS — Z79890 Hormone replacement therapy (postmenopausal): Secondary | ICD-10-CM | POA: Insufficient documentation

## 2022-12-01 DIAGNOSIS — Z86711 Personal history of pulmonary embolism: Secondary | ICD-10-CM | POA: Diagnosis not present

## 2022-12-01 DIAGNOSIS — E538 Deficiency of other specified B group vitamins: Secondary | ICD-10-CM | POA: Insufficient documentation

## 2022-12-01 DIAGNOSIS — E785 Hyperlipidemia, unspecified: Secondary | ICD-10-CM | POA: Diagnosis not present

## 2022-12-01 DIAGNOSIS — K219 Gastro-esophageal reflux disease without esophagitis: Secondary | ICD-10-CM | POA: Diagnosis not present

## 2022-12-01 DIAGNOSIS — C541 Malignant neoplasm of endometrium: Secondary | ICD-10-CM | POA: Insufficient documentation

## 2022-12-01 DIAGNOSIS — Z923 Personal history of irradiation: Secondary | ICD-10-CM | POA: Diagnosis not present

## 2022-12-01 DIAGNOSIS — Z7901 Long term (current) use of anticoagulants: Secondary | ICD-10-CM | POA: Diagnosis not present

## 2022-12-01 DIAGNOSIS — I2510 Atherosclerotic heart disease of native coronary artery without angina pectoris: Secondary | ICD-10-CM | POA: Diagnosis not present

## 2022-12-01 DIAGNOSIS — I700 Atherosclerosis of aorta: Secondary | ICD-10-CM | POA: Diagnosis not present

## 2022-12-01 DIAGNOSIS — Z8542 Personal history of malignant neoplasm of other parts of uterus: Secondary | ICD-10-CM | POA: Diagnosis not present

## 2022-12-01 DIAGNOSIS — Z79899 Other long term (current) drug therapy: Secondary | ICD-10-CM | POA: Diagnosis not present

## 2022-12-01 DIAGNOSIS — Z7982 Long term (current) use of aspirin: Secondary | ICD-10-CM | POA: Diagnosis not present

## 2022-12-01 DIAGNOSIS — I10 Essential (primary) hypertension: Secondary | ICD-10-CM | POA: Diagnosis not present

## 2022-12-01 DIAGNOSIS — K449 Diaphragmatic hernia without obstruction or gangrene: Secondary | ICD-10-CM | POA: Diagnosis not present

## 2022-12-01 DIAGNOSIS — Z86010 Personal history of colonic polyps: Secondary | ICD-10-CM | POA: Diagnosis not present

## 2022-12-01 DIAGNOSIS — Z08 Encounter for follow-up examination after completed treatment for malignant neoplasm: Secondary | ICD-10-CM | POA: Diagnosis not present

## 2022-12-01 DIAGNOSIS — K5730 Diverticulosis of large intestine without perforation or abscess without bleeding: Secondary | ICD-10-CM | POA: Insufficient documentation

## 2022-12-01 DIAGNOSIS — E039 Hypothyroidism, unspecified: Secondary | ICD-10-CM | POA: Diagnosis not present

## 2022-12-01 DIAGNOSIS — Z7952 Long term (current) use of systemic steroids: Secondary | ICD-10-CM | POA: Diagnosis not present
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
## 2022-12-07 ENCOUNTER — Ambulatory Visit: Payer: Medicare Other | Attending: Physician Assistant | Admitting: Physician Assistant

## 2022-12-07 ENCOUNTER — Encounter: Payer: Self-pay | Admitting: Physician Assistant

## 2022-12-07 ENCOUNTER — Other Ambulatory Visit
Admission: RE | Admit: 2022-12-07 | Discharge: 2022-12-07 | Disposition: A | Discharge status: Home / Self Care | Payer: Medicare Other | Attending: Physician Assistant | Admitting: Physician Assistant

## 2022-12-07 VITALS — BP 150/75 | HR 56 | Ht 62.0 in | Wt 210.8 lb

## 2022-12-07 DIAGNOSIS — R0609 Other forms of dyspnea: Secondary | ICD-10-CM

## 2022-12-07 DIAGNOSIS — E785 Hyperlipidemia, unspecified: Secondary | ICD-10-CM

## 2022-12-07 DIAGNOSIS — I2510 Atherosclerotic heart disease of native coronary artery without angina pectoris: Secondary | ICD-10-CM | POA: Diagnosis present

## 2022-12-07 DIAGNOSIS — I10 Essential (primary) hypertension: Secondary | ICD-10-CM | POA: Diagnosis present

## 2022-12-07 DIAGNOSIS — I700 Atherosclerosis of aorta: Secondary | ICD-10-CM

## 2022-12-07 LAB — CBC
HCT: 38.7 % (ref 36.0–46.0)
Hemoglobin: 13.3 g/dL (ref 12.0–15.0)
MCH: 30.6 pg (ref 26.0–34.0)
MCHC: 34.4 g/dL (ref 30.0–36.0)
MCV: 89.2 fL (ref 80.0–100.0)
Platelets: 415 10*3/uL — ABNORMAL HIGH (ref 150–400)
RBC: 4.34 MIL/uL (ref 3.87–5.11)
RDW: 12.4 % (ref 11.5–15.5)
WBC: 4.5 10*3/uL (ref 4.0–10.5)
nRBC: 0 % (ref 0.0–0.2)

## 2022-12-07 LAB — BASIC METABOLIC PANEL
Anion gap: 10 (ref 5–15)
BUN: 22 mg/dL (ref 8–23)
CO2: 25 mmol/L (ref 22–32)
Calcium: 9.4 mg/dL (ref 8.9–10.3)
Chloride: 105 mmol/L (ref 98–111)
Creatinine, Ser: 0.86 mg/dL (ref 0.44–1.00)
GFR, Estimated: >60 mL/min (ref >60)
Glucose, Bld: 88 mg/dL (ref 70–99)
Potassium: 3.6 mmol/L (ref 3.5–5.1)
Sodium: 140 mmol/L (ref 135–145)

## 2022-12-07 MED ORDER — SODIUM CHLORIDE 0.9% FLUSH: 3.0000 mL | Freq: Two times a day (BID) | INTRAVENOUS | Status: DC

## 2022-12-07 NOTE — Patient Instructions (Addendum)
Medication Instructions:  No changes at this time.   *If you need a refill on your cardiac medications before your next appointment, please call your pharmacy*   Lab Work: BMET & CBC today over at the [facility]. Stop at registration to check in.   If you have labs (blood work) drawn today and your tests are completely normal, you will receive your results only by: [place] (if you have MyChart) OR A paper copy in the mail If you have any lab test that is abnormal or we need to change your treatment, we will call you to review the results.   Testing/Procedures:  [place] A DEPT OF [place], [place] [address] Dept: [phone] Loc: [place]  12/07/2022  You are scheduled for a Cardiac Catheterization on Thursday, February 29 with Dr. Glenetta Hew.  1. Please arrive at 11:30 am to the [facility] entrance. Stop at registration to get checked in. Free valet parking service is available.   Special note: Every effort is made to have your procedure done on time. Please understand that emergencies sometimes delay scheduled procedures.  2. Diet: Do not eat solid foods after midnight.  The patient may have clear liquids until 5am upon the day of the procedure.  3. Medication instructions in preparation for your procedure: HOLD Furosemide that morning,    On the morning of your procedure, take your Aspirin 81 mg and any morning medicines NOT listed above.  You may use sips of water.  5. Plan for one night stay--bring personal belongings. 6. Bring a current list of your medications and current insurance cards. 7. You MUST have a responsible person to drive you home. 8. Someone MUST be with you the first 24 hours after you arrive home or your discharge will be delayed. 9. Please wear clothes that are easy to get on and off and wear  slip-on shoes.  Thank you for allowing [place] to care for you!   -- [facility]    Follow-Up: At [facility], you and your health needs are our priority.  As part of our continuing mission to provide you with exceptional heart care, we have created designated Provider Care Teams.  These Care Teams include your primary Cardiologist (physician) and Advanced Practice Providers (APPs -  Physician Assistants and Nurse Practitioners) who all work together to provide you with the care you need, when you need it.   Your next appointment:   2 week(s) post cath  Provider:   Glenetta Hew, MD

## 2022-12-08 ENCOUNTER — Telehealth: Payer: Self-pay

## 2022-12-08 NOTE — Progress Notes (Signed)
Study showed moderate sleep lab apnea with AHI of 22 and oxygen desaturations to 78%.  She will need a trial of CPAP therapy on 11 cm H2O with a Medium Large size Fisher&Paykel Nasal Pillow Brevida mask and heated humidification.  This per Dr. Juanetta Gosling recommendations.  We will see her in follow-up according to the lines after CPAP has been instituted.

## 2022-12-08 NOTE — Procedures (Signed)
     Patient Name: Mary Meyers, Mary Meyers Date: 11/28/2022 Gender: Female D.O.B: Mar 17, 1946 Age (years): 54 Referring Provider: Vernard Gambles MD Height (inches): 16 Interpreting Physician: Chesley Mires MD, ABSM Weight (lbs): 206 RPSGT: [place], [place] BMI: 38 MRN: [id number] Neck Size: 14.75  CLINICAL INFORMATION The patient is referred for a split night study with CPAP.  MEDICATIONS Medications self-administered by patient taken the night of the study : N/A  SLEEP STUDY TECHNIQUE As per the AASM Manual for the Scoring of Sleep and Associated Events v2.3 (April 2016) with a hypopnea requiring 4% desaturations.  The channels recorded and monitored were frontal, central and occipital EEG, electrooculogram (EOG), submentalis EMG (chin), nasal and oral airflow, thoracic and abdominal wall motion, anterior tibialis EMG, snore microphone, electrocardiogram, and pulse oximetry. Bi-level positive airway pressure (BiPAP) was initiated when the patient met split night criteria and was titrated according to treat sleep-disordered breathing.  RESPIRATORY PARAMETERS Diagnostic  Total AHI (/hr): 22.0 RDI (/hr): 27.7 OA Index (/hr): 0.8 CA Index (/hr): 0.0 REM AHI (/hr): 56.0 NREM AHI (/hr): 18.1 Supine AHI (/hr): 3.1 Non-supine AHI (/hr): 34.18 Min O2 Sat (%): 78.0 Mean O2 (%): 89.0 Time below 88% (min): 57.8   Titration  Optimal CPAP Pressure (cm): 11 AHI at Optimal Pressure (/hr): 2.1 Min O2 at Optimal Pressure (%): 85.0 Sleep % at Optimal (%): 94 Supine % at Optimal (%): 63    SLEEP ARCHITECTURE The study was initiated at 10:03:50 PM and terminated at 5:17:46 AM. The total recorded time was 433.9 minutes. EEG confirmed total sleep time was 372.5 minutes yielding a sleep efficiency of 85.8%. Sleep onset after lights out was 32.7 minutes with a REM latency of 64.0 minutes. The patient spent 9.7% of the night in stage N1 sleep, 40.1% in stage N2 sleep, 16.0% in stage N3 and 34.2% in  REM. Wake after sleep onset (WASO) was 28.8 minutes. The Arousal Index was 18.4/hour.  LEG MOVEMENT DATA The total Periodic Limb Movements of Sleep (PLMS) were 0. The PLMS index was 0.0 .  CARDIAC DATA The 2 lead EKG demonstrated sinus rhythm. The mean heart rate was 100.0 beats per minute. Other EKG findings include: PVCs.  IMPRESSIONS - Moderate obstructive sleep apnea with an AHI of 22 and SpO2 low of 78%. - She did well with CPAP at 11 cm H2O.  She was observed in REM and supine sleep at this pressure setting. - Supplemental oxygen was not used during this study.  DIAGNOSIS - Obstructive Sleep Apnea (G47.33)  RECOMMENDATIONS - Trial of CPAP therapy on 11 cm H2O with a Medium Large size Fisher&Paykel Nasal Pillow Brevida mask and heated humidification. - Avoid alcohol, sedatives and other CNS depressants that may worsen sleep apnea and disrupt normal sleep architecture. - Sleep hygiene should be reviewed to assess factors that may improve sleep quality. - Weight management and regular exercise should be initiated or continued.  [Electronically signed] 12/08/2022 09:07 AM  Chesley Mires MD, ABSM Diplomate, American Board of Sleep Medicine NPI: [id number]  [place] PH: [phone]   FX: [phone] [place]

## 2022-12-08 NOTE — Telephone Encounter (Addendum)
Progress Notes by Tyler Pita, MD at 11/28/2022  8:00 PM Version 1 of 1 Author: Tyler Pita, MD Service: Pulmonology Author Type: Physician  Filed: 12/08/2022 11:34 AM Encounter Date: 11/28/2022 Status: Signed  Editor: Tyler Pita, MD (Physician)  Study showed moderate sleep lab apnea with AHI of 22 and oxygen desaturations to 78%.  She will need a trial of CPAP therapy on 11 cm H2O with a Medium Large size Fisher&Paykel Nasal Pillow Brevida mask and heated humidification.  This per Dr. Juanetta Gosling recommendations.   We will see her in follow-up according to the lines after CPAP has been instituted.    Procedures by Chesley Mires, MD at 11/28/2022  8:00 PM Version 1 of 1 Author: Chesley Mires, MD Service: Critical Care Author Type: Physician  Filed: 12/08/2022  9:08 AM Encounter Date: 11/28/2022 Status: Signed  Editor: Chesley Mires, MD (Physician)                                                         Patient Name: Mary Meyers Study Date: 11/28/2022 Gender: Female D.O.B: [birth year] Age (years): 66 Referring Provider: Vernard Gambles MD Height (inches): 67 Interpreting Physician: Chesley Mires MD, ABSM Weight (lbs): 206 RPSGT: [place], [place] BMI: 38 MRN: [id number] Neck Size: 14.75   CLINICAL INFORMATION The patient is referred for a split night study with CPAP.   MEDICATIONS Medications self-administered by patient taken the night of the study : N/A   SLEEP STUDY TECHNIQUE As per the AASM Manual for the Scoring of Sleep and Associated Events v2.3 (April 2016) with a hypopnea requiring 4% desaturations.   The channels recorded and monitored were frontal, central and occipital EEG, electrooculogram (EOG), submentalis EMG (chin), nasal and oral airflow, thoracic and abdominal wall motion, anterior tibialis EMG, snore microphone, electrocardiogram, and pulse oximetry. Bi-level positive airway pressure (BiPAP) was initiated when the patient met split night criteria and  was titrated according to treat sleep-disordered breathing.   RESPIRATORY PARAMETERS Diagnostic   Total AHI (/hr):            22.0     RDI (/hr):         27.7     OA Index (/hr):            0.8       CA Index (/hr):      0.0 REM AHI (/hr):            56.0     NREM AHI (/hr):          18.1     Supine AHI (/hr):         3.1       Non-supine AHI (/hr):        34.18 Min O2 Sat (%):          78.0     Mean O2 (%):  89.0     Time below 88% (min):           57.8        Titration   Optimal CPAP Pressure (cm):            11        AHI at Optimal Pressure (/hr):            2.1       Min  O2 at Optimal Pressure (%): 85.0 Sleep % at Optimal (%):         94        Supine % at Optimal (%):       63             SLEEP ARCHITECTURE The study was initiated at 10:03:50 PM and terminated at 5:17:46 AM. The total recorded time was 433.9 minutes. EEG confirmed total sleep time was 372.5 minutes yielding a sleep efficiency of 85.8%. Sleep onset after lights out was 32.7 minutes with a REM latency of 64.0 minutes. The patient spent 9.7% of the night in stage N1 sleep, 40.1% in stage N2 sleep, 16.0% in stage N3 and 34.2% in REM. Wake after sleep onset (WASO) was 28.8 minutes. The Arousal Index was 18.4/hour.   LEG MOVEMENT DATA The total Periodic Limb Movements of Sleep (PLMS) were 0. The PLMS index was 0.0 .   CARDIAC DATA The 2 lead EKG demonstrated sinus rhythm. The mean heart rate was 100.0 beats per minute. Other EKG findings include: PVCs.   IMPRESSIONS - Moderate obstructive sleep apnea with an AHI of 22 and SpO2 low of 78%. - She did well with CPAP at 11 cm H2O.  She was observed in REM and supine sleep at this pressure setting. - Supplemental oxygen was not used during this study.   DIAGNOSIS - Obstructive Sleep Apnea (G47.33)   RECOMMENDATIONS - Trial of CPAP therapy on 11 cm H2O with a Medium Large size Fisher&Paykel Nasal Pillow Brevida mask and heated humidification. - Avoid alcohol, sedatives  and other CNS depressants that may worsen sleep apnea and disrupt normal sleep architecture. - Sleep hygiene should be reviewed to assess factors that may improve sleep quality. - Weight management and regular exercise should be initiated or continued.   [Electronically signed] 12/08/2022 09:07 AM   Chesley Mires MD, ABSM Diplomate, American Board of Sleep Medicine NPI: [id number]   [place] PH: [phone]   FX: [phone] [place]

## 2022-12-08 NOTE — Telephone Encounter (Signed)
Tyler Pita, MD (Physician)   Study showed moderate sleep lab apnea with AHI of 22 and oxygen desaturations to 78%.  She will need a trial of CPAP therapy on 11 cm H2O with a Medium Large size Fisher&Paykel Nasal Pillow Brevida mask and heated humidification.  This per Dr. Juanetta Gosling recommendations.   We will see her in follow-up according to the lines after CPAP has been instituted.      Lm for patient.

## 2022-12-08 NOTE — Telephone Encounter (Signed)
-----   Message from Tyler Pita, MD sent at 12/08/2022 11:32 AM EST -----    ----- Message ----- From: Chesley Mires, MD Sent: 12/08/2022   9:08 AM EST To: Tyler Pita, MD

## 2022-12-08 NOTE — Telephone Encounter (Signed)
ATC patient. LVM for patient to return my call.

## 2022-12-08 NOTE — Telephone Encounter (Signed)
Will close encounter as duplicate has been created.

## 2022-12-09 ENCOUNTER — Other Ambulatory Visit: Payer: Self-pay | Admitting: Otolaryngology

## 2022-12-09 DIAGNOSIS — R42 Dizziness and giddiness: Secondary | ICD-10-CM

## 2022-12-09 NOTE — Telephone Encounter (Signed)
ATC patient. LVM for patient to return my call. I will try one more time due to the nature of the call.

## 2022-12-10 NOTE — Telephone Encounter (Signed)
Lm for patient.  Letter mailed to address on file.  Will close encounter per office protocol.

## 2022-12-16 ENCOUNTER — Other Ambulatory Visit: Payer: Self-pay

## 2022-12-16 ENCOUNTER — Encounter
Admission: RE | Disposition: A | Discharge status: Home / Self Care | Payer: Self-pay | Source: Home / Self Care | Attending: Cardiology

## 2022-12-16 ENCOUNTER — Ambulatory Visit
Admission: RE | Admit: 2022-12-16 | Discharge: 2022-12-16 | Disposition: A | Discharge status: Home / Self Care | Payer: Medicare Other | Attending: Cardiology | Admitting: Cardiology

## 2022-12-16 ENCOUNTER — Encounter: Payer: Self-pay | Admitting: Cardiology

## 2022-12-16 DIAGNOSIS — Z86711 Personal history of pulmonary embolism: Secondary | ICD-10-CM | POA: Insufficient documentation

## 2022-12-16 DIAGNOSIS — I700 Atherosclerosis of aorta: Secondary | ICD-10-CM | POA: Diagnosis not present

## 2022-12-16 DIAGNOSIS — Z79899 Other long term (current) drug therapy: Secondary | ICD-10-CM | POA: Diagnosis not present

## 2022-12-16 DIAGNOSIS — E785 Hyperlipidemia, unspecified: Secondary | ICD-10-CM | POA: Insufficient documentation

## 2022-12-16 DIAGNOSIS — Z7982 Long term (current) use of aspirin: Secondary | ICD-10-CM | POA: Diagnosis not present

## 2022-12-16 DIAGNOSIS — I10 Essential (primary) hypertension: Secondary | ICD-10-CM | POA: Insufficient documentation

## 2022-12-16 DIAGNOSIS — Z87891 Personal history of nicotine dependence: Secondary | ICD-10-CM | POA: Insufficient documentation

## 2022-12-16 DIAGNOSIS — I2510 Atherosclerotic heart disease of native coronary artery without angina pectoris: Secondary | ICD-10-CM | POA: Insufficient documentation

## 2022-12-16 DIAGNOSIS — G4733 Obstructive sleep apnea (adult) (pediatric): Secondary | ICD-10-CM | POA: Insufficient documentation

## 2022-12-16 DIAGNOSIS — I272 Pulmonary hypertension, unspecified: Secondary | ICD-10-CM

## 2022-12-16 DIAGNOSIS — R0609 Other forms of dyspnea: Secondary | ICD-10-CM | POA: Diagnosis not present

## 2022-12-16 HISTORY — PX: RIGHT HEART CATH: CATH118263

## 2022-12-16 LAB — POCT I-STAT EG7
Acid-Base Excess: 2 mmol/L (ref 0.0–2.0)
Bicarbonate: 27 mmol/L (ref 20.0–28.0)
Calcium, Ion: 1.22 mmol/L (ref 1.15–1.40)
HCT: 38 % (ref 36.0–46.0)
Hemoglobin: 12.9 g/dL (ref 12.0–15.0)
O2 Saturation: 79 %
Potassium: 3 mmol/L — ABNORMAL LOW (ref 3.5–5.1)
Sodium: 138 mmol/L (ref 135–145)
TCO2: 28 mmol/L (ref 22–32)
pCO2, Ven: 42.6 mmHg — ABNORMAL LOW (ref 44–60)
pH, Ven: 7.41 (ref 7.25–7.43)
pO2, Ven: 43 mmHg (ref 32–45)

## 2022-12-16 SURGERY — RIGHT HEART CATH
Anesthesia: Moderate Sedation | Laterality: Right

## 2022-12-16 MED ORDER — SODIUM CHLORIDE 0.9 % IV SOLN: INTRAVENOUS | Status: DC

## 2022-12-16 MED ORDER — SODIUM CHLORIDE 0.9% FLUSH: 3.0000 mL | INTRAVENOUS | Status: DC | PRN

## 2022-12-16 MED ORDER — ACETAMINOPHEN 325 MG PO TABS: 650.0000 mg | ORAL_TABLET | ORAL | Status: DC | PRN

## 2022-12-16 MED ORDER — LABETALOL HCL 5 MG/ML IV SOLN: 10.0000 mg | INTRAVENOUS | Status: DC | PRN

## 2022-12-16 MED ORDER — SODIUM CHLORIDE 0.9 % IV SOLN: 250.0000 mL | INTRAVENOUS | Status: DC | PRN

## 2022-12-16 MED ORDER — HEPARIN (PORCINE) IN NACL 1000-0.9 UT/500ML-% IV SOLN
INTRAVENOUS | Status: DC | PRN
  Administered 2022-12-16 (×2): 500 mL

## 2022-12-16 MED ORDER — FENTANYL CITRATE (PF) 100 MCG/2ML IJ SOLN
INTRAMUSCULAR | Status: AC
  Filled 2022-12-16: qty 2

## 2022-12-16 MED ORDER — MIDAZOLAM HCL 2 MG/2ML IJ SOLN
INTRAMUSCULAR | Status: DC | PRN
  Administered 2022-12-16: 1 mg via INTRAVENOUS

## 2022-12-16 MED ORDER — HYDRALAZINE HCL 20 MG/ML IJ SOLN: 10.0000 mg | INTRAMUSCULAR | Status: DC | PRN

## 2022-12-16 MED ORDER — MIDAZOLAM HCL 2 MG/2ML IJ SOLN
INTRAMUSCULAR | Status: AC
  Filled 2022-12-16: qty 2

## 2022-12-16 MED ORDER — ONDANSETRON HCL 4 MG/2ML IJ SOLN: 4.0000 mg | Freq: Four times a day (QID) | INTRAMUSCULAR | Status: DC | PRN

## 2022-12-16 MED ORDER — SODIUM CHLORIDE 0.9% FLUSH: 3.0000 mL | Freq: Two times a day (BID) | INTRAVENOUS | Status: DC

## 2022-12-16 MED ORDER — HEPARIN (PORCINE) IN NACL 1000-0.9 UT/500ML-% IV SOLN
INTRAVENOUS | Status: AC
  Filled 2022-12-16: qty 1000

## 2022-12-16 SURGICAL SUPPLY — 7 items
CATH SWAN GANZ 7F STRAIGHT (CATHETERS) IMPLANT
DRAPE BRACHIAL (DRAPES) IMPLANT
GLIDESHEATH SLENDER 7FR .021G (SHEATH) IMPLANT
PACK CARDIAC CATH (CUSTOM PROCEDURE TRAY) ×1 IMPLANT
PROTECTION STATION PRESSURIZED (MISCELLANEOUS) ×1
SET ATX-X65L (MISCELLANEOUS) IMPLANT
STATION PROTECTION PRESSURIZED (MISCELLANEOUS) IMPLANT

## 2022-12-16 NOTE — Interval H&P Note (Signed)
History and Physical Interval Note:  12/16/2022 1:57 PM  Mary Meyers  has presented today for surgery, with the diagnosis of R Cath   Pulmonary hypertension.  The various methods of treatment have been discussed with the patient and family. After consideration of risks, benefits and other options for treatment, the patient has consented to  Procedure(s): RIGHT HEART CATH (Right) as a surgical intervention.  The patient's history has been reviewed, patient examined, no change in status, stable for surgery.  I have reviewed the patient's chart and labs.  Questions were answered to the patient's satisfaction.     Glenetta Hew

## 2022-12-16 NOTE — Discharge Instructions (Signed)
Right Heart Cath, Care After This sheet gives you information about how to care for yourself after your procedure. Your health care provider may also give you more specific instructions. If you have problems or questions, contact your health care provider. What can I expect after the procedure? After the procedure, it is common to have: Bruising or mild discomfort in the area where the IV was inserted (insertion site). Follow these instructions at home: Eating and drinking  You may eat and drink after your procedure.  Drink a lot of fluids for the first several days after the procedure, as directed by your health care provider. This helps to wash (flush) the contrast out of your body. Examples of healthy fluids include water or low-calorie drinks. General instructions Check your IV insertion area and also your venous access site every day for signs of infection. Check for: Redness, swelling, or pain. Fluid or blood. Warmth. Pus or a bad smell. Take over-the-counter and prescription medicines only as told by your health care provider. Rest and return to your normal activities as told by your health care provider. Ask your health care provider what activities are safe for you. Do not drive for 24 hours if you were given a medicine to help you relax (sedative), or until your health care provider approves. Keep all follow-up visits as told by your health care provider. This is important. Contact a health care provider if: Your skin becomes itchy or you develop a rash or hives. You have a fever that does not get better with medicine. You feel nauseous. You vomit. You have redness, swelling, or pain around the insertion site. You have fluid or blood coming from the insertion site. Your insertion area feels warm to the touch. You have pus or a bad smell coming from the insertion site. Get help right away if: You have difficulty breathing or shortness of breath. You develop chest pain. You  faint. You feel very dizzy. These symptoms may represent a serious problem that is an emergency. Do not wait to see if the symptoms will go away. Get medical help right away. Call your local emergency services (911 in the [place] Do not drive yourself to the hospital. Summary After your procedure, it is common to have bruising or mild discomfort in the area where the IV was inserted. You should check your IV insertion area every day for signs of infection. Take over-the-counter and prescription medicines only as told by your health care provider. You should drink a lot of fluids for the first several days after the procedure to help flush the contrast from your body. This information is not intended to replace advice given to you by your health care provider. Make sure you discuss any questions you have with your health care provider. Document Released: 07/25/2013 Document Revised: 09/16/2017 Document Reviewed: 08/28/2016 Elsevier Patient Education  2020 Elsevier Inc. 

## 2022-12-17 ENCOUNTER — Encounter: Payer: Self-pay | Admitting: Cardiology

## 2022-12-17 LAB — POCT I-STAT EG7
Acid-Base Excess: 1 mmol/L (ref 0.0–2.0)
Bicarbonate: 26.4 mmol/L (ref 20.0–28.0)
Calcium, Ion: 1.18 mmol/L (ref 1.15–1.40)
HCT: 37 % (ref 36.0–46.0)
Hemoglobin: 12.6 g/dL (ref 12.0–15.0)
O2 Saturation: 77 %
Potassium: 2.9 mmol/L — ABNORMAL LOW (ref 3.5–5.1)
Sodium: 140 mmol/L (ref 135–145)
TCO2: 28 mmol/L (ref 22–32)
pCO2, Ven: 42.7 mmHg — ABNORMAL LOW (ref 44–60)
pH, Ven: 7.399 (ref 7.25–7.43)
pO2, Ven: 42 mmHg (ref 32–45)

## 2022-12-23 ENCOUNTER — Ambulatory Visit (INDEPENDENT_AMBULATORY_CARE_PROVIDER_SITE_OTHER): Payer: Medicare Other | Admitting: Pulmonary Disease

## 2022-12-23 ENCOUNTER — Encounter: Payer: Self-pay | Admitting: Pulmonary Disease

## 2022-12-23 VITALS — BP 132/84 | HR 67 | Temp 99.3°F | Ht 62.0 in | Wt 206.6 lb

## 2022-12-23 DIAGNOSIS — G4733 Obstructive sleep apnea (adult) (pediatric): Secondary | ICD-10-CM

## 2022-12-23 DIAGNOSIS — I272 Pulmonary hypertension, unspecified: Secondary | ICD-10-CM | POA: Diagnosis not present

## 2022-12-23 DIAGNOSIS — R0602 Shortness of breath: Secondary | ICD-10-CM | POA: Diagnosis not present

## 2022-12-23 NOTE — Progress Notes (Signed)
Subjective:    Patient ID: Mary Meyers, female    DOB: 1946/06/03, 77 y.o.   MRN: [id number] Patient Care Team: Juluis Pitch, MD as PCP - General (Family Medicine) Leonie Man, MD as PCP - Cardiology (Cardiology) Clent Jacks, RN as Registered Nurse  Chief Complaint  Patient presents with   Follow-up    SOB with exertion. No wheezing or cough.     HPI Patient is a 77 year old remote former smoker with minimal tobacco exposure in the past, who presents for follow-up on the issue of shortness of breath.  She was last seen by me on 11 November 2022, at that time we reordered PFTs and a sleep study.  Her workup has been difficult due to her inability to follow-up with appointments or with requested tests.  She was taking care of her very ill husband who passed away on 09-28-2023 and she is understandably, distraught over this.  She is now attending a bereavement group and this has helped her.  She had pulmonary function testing performed on 19 November 2022 and these were essentially normal with perhaps very minimal obstructive defect in the small airways.  It does not explain the severity of her dyspnea.  She had an echocardiogram which was performed on 25 May 2022 and showed that she had an LVEF of 60 to 65%, normal left ventricular function, grade 1 diastolic dysfunction.  There was moderately increased right ventricular size no evidence of valvular disease.  Right atrial pressure was estimated to be 15 mmHg.  Patient underwent sleep study at [facility] long that showed that she has moderate sleep apnea with an AHI of 22 and O2 nadir at 78%.  She was titrated to CPAP of 11 cm H2O.  We ordered the equipment for her however she had not responded to our calls nor the DME calls. She presents today with her son.  Continues to have issues with significant shortness of breath.  Cardiology perform a right heart cath on 29 February this showed borderline mild pulmonary hypertension with  mildly elevated pulmonary capillary wedge pressure in the setting of systemic hypertension.   She continues to have dyspnea as noted above.  No increase or change from prior.  She states that she can do her recumbent bike at home without difficulty but as soon as she gets walking she gets winded.  Does not endorse cough, sputum production nor hemoptysis.  No lower extremity edema, no calf tenderness.    DATA 04/15/2022 chest x-ray PA and lateral: Prominent right hilar region, unchanged, otherwise no active cardiopulmonary disease. 05/25/2022 echocardiogram: LVEF 60 to 65%, mild LVH, rate 1 DD, RV size moderately enlarged, trivial aortic valve regurgitation, dilated inferior vena cava suggesting right atrial pressure of 15 mgHg. 11/19/2022 PFTs: FEV1 1.91 L or 101% predicted, FVC 2.54 L or 100% predicted, FEV1/FVC 75%, volumes normal.  Diffusion capacity normal.  No significant bronchodilator response.  Minimal if any, obstruction but for the most part study is normal.  Study does not explain the patient's dyspnea. 11/28/2022 split-night sleep study: Moderate obstructive sleep apnea with AHI of 22 and SpO2 low of 78%.  She did well with CPAP at 11 cm H2O.  She was fitted with a medium large Fisher-Paykel nasal pillow provided a mask and heated humidity, chinstrap was provided. 12/16/2022 right heart cath: Hemodynamic findings consistent with mild pulmonary hypertension.  Measures numbers do not correlate with estimated numbers on 2D echo.   Review of Systems A 10 point  review of systems was performed and it is as noted above otherwise negative.  Patient Active Problem List   Diagnosis Date Noted   Pulmonary hypertension ([place]) 12/16/2022   Precordial pain 11/05/2022   Chronic dyspnea 11/05/2022   PMB (postmenopausal bleeding) 03/18/2021   B12 deficiency 11/21/2020   Elevated glucose 11/21/2020   Vertigo 04/09/2019   History of pulmonary embolism 01/20/2018   Endometrial cancer ([place])  12/13/2017   S/P TAH-BSO 11/14/2017   Hypertension 11/01/2017   Bradycardia 10/03/2017   Osteoarthritis of knee 02/07/2017   Tremor 10/29/2016   Arthritis 05/17/2016   Depression 05/17/2016   Gastroesophageal reflux disease without esophagitis 05/17/2016   Acquired hypothyroidism 09/22/2015   Hyperlipidemia 09/22/2015   Cough 03/27/2013   Abnormal chest CT 03/27/2013   Social History   Tobacco Use   Smoking status: Former    Types: Cigarettes    Quit date: 10/19/1971    Years since quitting: 51.2   Smokeless tobacco: Never  Substance Use Topics   Alcohol use: Not Currently   Allergies  Allergen Reactions   Cortisone Hives and Other (See Comments)    Burning/swelling.   Iodinated Contrast Media Other (See Comments)    Burning/swelling 11/15/22 - contrasted cardiac CTA w/ 13 hr prep , called back on 11/16/22 with itching, hives, swelling to face. Instructed patient to never have contrast ever again due to breakthru reaction   Prednisone    Tramadol Nausea And Vomiting and Other (See Comments)    dizziness   No outpatient medications have been marked as taking for the 12/23/22 encounter (Appointment) with Tyler Pita, MD.   Immunization History  Administered Date(s) Administered   Influenza Split 10/23/2014   Influenza, High Dose Seasonal PF 06/24/2017, 07/07/2018   Influenza,inj,Quad PF,6+ Mos 08/07/2019   Influenza,inj,quad, With Preservative 07/17/2018   Influenza-Unspecified 10/23/2014, 10/25/2016, 06/24/2017, 07/08/2020, 08/04/2021   PFIZER Comirnaty(Gray Top)Covid-19 Tri-Sucrose Vaccine 11/03/2019, 11/27/2019, 09/16/2020, 03/17/2021   Pneumococcal Conjugate-13 04/14/2018   Pneumococcal Polysaccharide-23 04/09/2019   Tdap 04/06/2018   Unspecified SARS-COV-2 Vaccination 07/03/2021       Objective:   Physical Exam BP 132/84 (BP Location: Left Arm, Cuff Size: Normal)   Pulse 67   Temp 99.3 F (37.4 C)   Ht '5\' 2"'$  (1.575 m)   Wt 206 lb 9.6 oz (93.7 kg)    SpO2 96%   BMI 37.79 kg/m   SpO2: 96 % O2 Device: None (Room air)  GENERAL: Obese woman, no acute distress, no conversational dyspnea, fully ambulatory. HEAD: Normocephalic, atraumatic.  EYES: Pupils equal, round, reactive to light.  No scleral icterus.  MOUTH: Nose/mouth/throat not examined due to institutional masking requirements. NECK: Supple. No thyromegaly. Trachea midline. No JVD.  No adenopathy. PULMONARY: Good air entry bilaterally.  No adventitious sounds. CARDIOVASCULAR: S1 and S2. Regular rate and rhythm.  ABDOMEN: Obese otherwise, benign. MUSCULOSKELETAL: No joint deformity, no clubbing, no edema.  NEUROLOGIC: No overt focal deficit, no gait disturbance, speech is fluent. SKIN: Intact,warm,dry. PSYCH: Tearful, anxious.     Assessment & Plan:     ICD-10-CM   1. Pulmonary hypertension (HCC)  I27.20    This is very mild Had mild increase pulmonary capillary wedge pressure Lung function is normal Does have OSA    2. OSA (obstructive sleep apnea)  G47.33 AMB REFERRAL FOR DME   Auto CPAP ordered Stressed importance of compliance with device    3. SOB (shortness of breath)  R06.02    Exertional Out of proportion to PFTs Out of proportion  to cardiac findings Suspect deconditioning     Orders Placed This Encounter  Procedures   AMB REFERRAL FOR DME    Referral Priority:   Routine    Referral Type:   Durable Medical Equipment Purchase    Number of Visits Requested:   1   Discussed with the patient and her son that she likely has issues with deconditioning.  She is currently enrolling in a conditioning program and I recommend this highly.  Will see the patient in follow-up in 3 months time she is to contact us prior to that time should any new difficulties arise.   Renold Don, MD Advanced Bronchoscopy PCCM [facility]    *This note was dictated using voice recognition software/Dragon.  Despite best efforts to proofread, errors can  occur which can change the meaning. Any transcriptional errors that result from this process are unintentional and may not be fully corrected at the time of dictation.

## 2022-12-23 NOTE — Patient Instructions (Addendum)
We are going to set you up with CPAP equipment to treat your obstructive sleep apnea.  Continue efforts at a conditioning program.  We will see on follow-up in 3 months time call sooner should any new problems arise.   Le instalaremos un equipo "CPAP" para tratar su apnea obstructiva del dormir.  Continue los esfuerzos de Chief Financial Officer en un programa de acondicionamiento.  Nos veremos en seguimiento dentro de 3 meses pero llamenos antes si surge algn problema nuevo.

## 2022-12-30 ENCOUNTER — Telehealth: Payer: Self-pay | Admitting: Pulmonary Disease

## 2022-12-30 NOTE — Telephone Encounter (Signed)
Pt states no one has reached out concerning cpap set up

## 2022-12-30 NOTE — Telephone Encounter (Signed)
Rodena Piety, can you assist with this? Order placed

## 2022-12-30 NOTE — Telephone Encounter (Signed)
Order was sent to [facility] on 12/23/22 per patients request. The order was confirmed by Terri C with [facility] on 12/29/22

## 2023-01-06 ENCOUNTER — Other Ambulatory Visit: Payer: Medicare Other

## 2023-01-07 ENCOUNTER — Encounter: Payer: Self-pay | Admitting: Pulmonary Disease

## 2023-01-07 ENCOUNTER — Encounter: Payer: Self-pay | Admitting: Cardiology

## 2023-01-07 ENCOUNTER — Ambulatory Visit: Payer: Medicare Other | Attending: Cardiology | Admitting: Cardiology

## 2023-01-07 VITALS — BP 132/74 | HR 72 | Ht 62.0 in | Wt 204.8 lb

## 2023-01-07 DIAGNOSIS — I10 Essential (primary) hypertension: Secondary | ICD-10-CM | POA: Insufficient documentation

## 2023-01-07 DIAGNOSIS — E782 Mixed hyperlipidemia: Secondary | ICD-10-CM | POA: Diagnosis present

## 2023-01-07 DIAGNOSIS — R0609 Other forms of dyspnea: Secondary | ICD-10-CM | POA: Insufficient documentation

## 2023-01-07 DIAGNOSIS — I272 Pulmonary hypertension, unspecified: Secondary | ICD-10-CM | POA: Diagnosis present

## 2023-01-07 NOTE — Patient Instructions (Signed)
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
## 2023-01-10 ENCOUNTER — Ambulatory Visit
Admission: RE | Admit: 2023-01-10 | Discharge: 2023-01-10 | Disposition: A | Discharge status: Home / Self Care | Payer: Medicare Other | Source: Ambulatory Visit | Attending: Otolaryngology | Admitting: Otolaryngology

## 2023-01-10 DIAGNOSIS — R42 Dizziness and giddiness: Secondary | ICD-10-CM

## 2023-01-10 MED ORDER — GADOPICLENOL 0.5 MMOL/ML IV SOLN
10.0000 mL | Freq: Once | INTRAVENOUS | Status: AC | PRN
  Administered 2023-01-10: 10 mL via INTRAVENOUS

## 2023-01-12 ENCOUNTER — Encounter: Payer: Self-pay | Admitting: Adult Health

## 2023-01-12 ENCOUNTER — Ambulatory Visit (INDEPENDENT_AMBULATORY_CARE_PROVIDER_SITE_OTHER): Payer: Medicare Other | Admitting: Adult Health

## 2023-01-12 VITALS — BP 151/85 | HR 69 | Ht 63.0 in | Wt 204.0 lb

## 2023-01-12 DIAGNOSIS — F4321 Adjustment disorder with depressed mood: Secondary | ICD-10-CM | POA: Diagnosis not present
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
## 2023-01-13 ENCOUNTER — Ambulatory Visit: Payer: Medicare Other | Admitting: Psychiatry

## 2023-01-25 ENCOUNTER — Encounter: Payer: Self-pay | Admitting: Cardiology

## 2023-01-25 NOTE — Assessment & Plan Note (Signed)
Doing much better now that she is lost weight.  I think now that she is more active and exercise with weight loss, her dyspnea is improving.  Continue to recommend exercise and weight loss.  Continue to follow with pulmonary medicine.  She benefits from using albuterol inhaler.

## 2023-01-25 NOTE — Assessment & Plan Note (Signed)
BP relatively stable at 132/74.  Monitor closely, could consider ARB for afterload reduction in addition to Maxide.  Using as needed furosemide which hopefully she does not need that much in the setting of Maxide.  If BP were to increase, would definitely consider ARB over beta-blocker based on lack of CAD and need for afterload reduction.

## 2023-01-25 NOTE — Assessment & Plan Note (Signed)
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  TPG 7.  Likely some component of obesity related symptoms as well as mild diastolic dysfunction.  Not consistent with level of dyspnea that she had been having.  Plan: Continue as needed furosemide. BP control continue Maxide, consider ARB if blood pressures elevate-this would help with afterload reduction.

## 2023-01-25 NOTE — Assessment & Plan Note (Signed)
Followed by PCP.  Is on 40 mg atorvastatin.

## 2023-01-26 ENCOUNTER — Ambulatory Visit (INDEPENDENT_AMBULATORY_CARE_PROVIDER_SITE_OTHER): Payer: Medicare Other | Admitting: Psychiatry

## 2023-01-26 DIAGNOSIS — F411 Generalized anxiety disorder: Secondary | ICD-10-CM
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
## 2023-02-04 ENCOUNTER — Encounter: Payer: Self-pay | Admitting: Pulmonary Disease

## 2023-02-08 ENCOUNTER — Ambulatory Visit (INDEPENDENT_AMBULATORY_CARE_PROVIDER_SITE_OTHER): Payer: Medicare Other | Admitting: Psychiatry

## 2023-02-08 DIAGNOSIS — F411 Generalized anxiety disorder: Secondary | ICD-10-CM | POA: Diagnosis not present

## 2023-02-08 NOTE — Progress Notes (Signed)
[facility] Counselor/Therapist Progress Note  Patient ID: Mary Meyers, MRN: [id number],    Date: 02/08/2023  Time Spent: 50 minutes  Treatment Type: Individual Therapy  Reported Symptoms: anxiety, grief  Mental Status Exam:  Appearance:   Casual and Neat     Behavior:  Appropriate, Sharing, and Motivated  Motor:  Normal  Speech/Language:   Clear and Coherent  Affect:  Tearful, anxiety, grief  Mood:  anxious and sad  Thought process:  goal directed  Thought content:    Rumination  Sensory/Perceptual disturbances:    WNL  Orientation:  oriented to person, place, time/date, situation, day of week, month of year, year, and stated date of February 08, 2023  Attention:  Good  Concentration:  Good  Memory:  Some short term memory issues and states Dr is aware  Fund of knowledge:   Good  Insight:    Good and Fair  Judgment:   Good  Impulse Control:  Good and Fair   Risk Assessment: Danger to Self:  No Self-injurious Behavior: No Danger to Others: No Duty to Warn:no Physical Aggression / Violence:No  Access to Firearms a concern: No  Gang Involvement:No   Subjective:  Patient in today reporting anxiety and grief/sadness related to husband's death which she continues to work on today. States her main issue currently is the loss of her husband and how she has some regrets to work through "and it's kind of complicated." Some very personal issues between patient and husband prior to his death, she is processing today and feeling more sadness. "I loved him so much."  Feeling some guilt regarding some of their interactions, and explained in more detail more personal details. (Not all details included in this note.)  Feels fortunate that her 2 adult sons live locally after she and husband had moved here, prior to husband's death.Really missing husband and tearfully describes how this is for her. Encouraged her openness with her unresolved grief and some other sensitive situations  with which she is dealing (mostly related to family). Talked almost non-stop today, not rapidly but more in a way that she was really looking forward to talking through some of her concerns today. Encouraged patient in doing some journaling prior to next session and bring it in with her.   Interventions: Cognitive Behavioral Therapy and Ego-Supportive  Diagnosis:   ICD-10-CM   1. Generalized anxiety disorder  F41.1      Plan: Patient in today showing good motivation, easily distracted and hard to stay on topic at times. States she keeps a P[employer] consultant which helps her remember things. Did well in talking through more of her grief today regarding death of husband.  Review of her goal-directed behaviors and needs to continue working with things in order to move in a forward direction.  Encouraged patient in her practice of positive and self affirming behaviors including: Staying in contact with others in the family that want to support her, getting outside some each day and walking, noticing at least 1 positive daily, allow her faith to be an emotional support as well as spiritual, challenge and counteract her self-doubt, reduce overthinking and over analyzing, stop self negating, practice more intentional listening to others that care about her, practice "the pause" as needed throughout the day or evening, letting herself have a "timeout" when stressed, consistent positive self talk, refrain from assuming worst-case scenarios, healthy nutrition and exercise, stay on any prescribed medication, and letting her faith be a resource for her  emotionally as well as spiritually, and recognize the strength she shows working with goal-directed behaviors to move in a direction that supports increased self-confidence, improved emotional health and healing, and overall wellbeing.  Goal review and progress/challenges noted with patient.  Next appointment within 1 to 2 weeks.  This record has been created using [employer].  Chart creation errors have been sought, but may not always have been located and corrected.  Such creation errors do not reflect on the standard of medical care provided.   Mathis Fare, LCSW

## 2023-02-15 ENCOUNTER — Ambulatory Visit (INDEPENDENT_AMBULATORY_CARE_PROVIDER_SITE_OTHER): Payer: Medicare Other | Admitting: Psychiatry

## 2023-02-15 DIAGNOSIS — F411 Generalized anxiety disorder: Secondary | ICD-10-CM | POA: Diagnosis not present

## 2023-02-15 NOTE — Progress Notes (Signed)
[facility] Counselor/Therapist Progress Note  Patient ID: Mary Meyers, MRN: [id number],    Date: 02/15/2023  Time Spent:  55 minutes   Treatment Type: Individual Therapy  Reported Symptoms: anxiety, some depression "sometimes worse than others", denies any SI  Mental Status Exam:  Appearance:   Casual and Neat     Behavior:  Appropriate, Sharing, and Motivated  Motor:  Normal  Speech/Language:   Clear and Coherent  Affect:  Depressed and anxious  Mood:  depressed and anxious  Thought process:  goal directed  Thought content:    Rumination  Sensory/Perceptual disturbances:    WNL  Orientation:  oriented to person, place, time/date, situation, day of week, month of year, year, and stated date of February 15, 2023  Attention:  Good  Concentration:  Good  Memory:  Reports some short term memory issues occasionally and Dr is aware  Fund of knowledge:   Good  Insight:    Good and Fair  Judgment:   Good  Impulse Control:  Good   Risk Assessment: Danger to Self:  No Self-injurious Behavior: No Danger to Others: No Duty to Warn:no Physical Aggression / Violence:No  Access to Firearms a concern: No  Gang Involvement:No   Subjective:   Patient in today reporting anxiety along with grief/sadness but does feel she is making progress.  Her grief and sadness as well as a lot of her anxiety is related to husband's death and she is showing good energy and working on her anxiety and grief. Some depression "at times". Feels she is doing a little better. Working well in sessions. Anxiety and grief are he main concerns today. Family remains supportive and adult sons are especially attentive of patient especially since the death of her husband.Today, talks through more of her feelings of grief and able to to identify a few more times when she has felt more encouraged as she continues to work through her sadness and grief. Some decrease in tearfulness and believing more in herself. Needed  to also process some "confidential" concerns re: one of her grandsons that she has helped out with a lot. (Not all details included in this note due to patient privacy needs. Patient is very sensitive about details within family and has been very hurt by some actions and words that have occurred within part of family.) Seemed to benefit from talking through these sensitive family history issues, stating that she felt I needed to know these issues, explaining how it has heavily impacted her especially the past couple of years. Is able to see some of her own progress, some re: grief over husband's death, has more perseverance, "starting to feel some better about myself". Discussed homework for next session. Patient showing good motivation.  Less guilt.  Very sensitive.  Talked more openly today.  Encouraged her in doing more journaling between sessions.  Interventions: Cognitive Behavioral Therapy, Ego-Supportive, and Grief Therapy  1.Appropriately grieve the loss of husband in order to normalize mood and tor return to previous adaptive level of functioning.  2. Verbalize an understanding of the relationship between repressed anger and depressed mood. Verbalize any unresolved grief issues that may be contributing to depression.  3. Identify cognitive self-talk that supports depressed and anxious mood; Replace negative self-defeating self-talk with verbalization of realistic and positive cognitive messages that do not support depression.   Diagnosis:   ICD-10-CM   1. Generalized anxiety disorder  F41.1      Plan:  Patient in today  and actively participating in session.  Still easily distracted at times and seems to come from some of her anxiety and eagerness to feel heard and wanting to improve.  States that she does feel heard and supported in her sessions, feels heard and supported by her family, but the loss of her husband has been huge for her and definitely increased her anxiety and sense of  vulnerability.  Also shared today very confidential issue that has happened a couple of years ago but she is realizing that it is impacted her ever since and impacted the family in some ways.  Is making progress, showing motivation, and needing to continue her work with goal-directed behaviors to move in a forward direction. Encouraged patient in her practice of self affirming and positive behaviors including: Remaining in contact with family and extended family that want to support her, getting outside some each day and walking, noticing at least 1 positive daily, allow her faith to be an emotional support as well as spiritual, challenge and counteract her self-doubt, reduce overthinking and over analyzing, refrain from self negating, practice more intentional listening to others that care about her, practice "the pause" as needed, consistent positive self talk, refrain from assuming worst-case scenarios, healthy nutrition and exercise, remain on prescribed medication, let her faith be a resource for her emotionally as well as spiritually, and realize the strength she shows working with goal-directed behaviors to move in a direction that supports increased self-confidence, improved emotional health, and outlook.  Goal review and progress/challenges noted with patient.  Next appointment within 1 to 2 weeks.   Mathis Fare, LCSW

## 2023-03-02 ENCOUNTER — Ambulatory Visit (INDEPENDENT_AMBULATORY_CARE_PROVIDER_SITE_OTHER): Payer: Medicare Other | Admitting: Psychiatry

## 2023-03-02 DIAGNOSIS — F411 Generalized anxiety disorder: Secondary | ICD-10-CM | POA: Diagnosis not present

## 2023-03-02 NOTE — Progress Notes (Signed)
[facility] Counselor/Therapist Progress Note  Patient ID: Mary Meyers, MRN: [id number],    Date: 03/02/2023  Time Spent: 50 minutes    Treatment Type: Individual Therapy  Virtual Visit via Telehealth Note: My Chart Video Connected with patient by a telemedicine/telehealth application, with their informed consent, and verified patient privacy and that I am speaking with the correct person using two identifiers. I discussed the limitations, risks, security and privacy concerns of performing psychotherapy and the availability of in person appointments. I also discussed with the patient that there may be a patient responsible charge related to this service. The patient expressed understanding and agreed to proceed. I discussed the treatment planning with the patient. The patient was provided an opportunity to ask questions and all were answered. The patient agreed with the plan and demonstrated an understanding of the instructions. The patient was advised to call  our office if  symptoms worsen or feel they are in a crisis state and need immediate contact.   Therapist Location: office Patient Location: home   Reported Symptoms: anxiety, grief  Mental Status Exam:  Appearance:   Casual and Neat     Behavior:  Appropriate, Sharing, and Motivated  Motor:  Normal  Speech/Language:   Clear and Coherent  Affect:  Anxiety, grief "but some better"  Mood:  anxious and sad  Thought process:  Some sadness  Thought content:    Rumination  Sensory/Perceptual disturbances:    Normal  Orientation:  oriented to person, place, time/date, situation, day of week, month of year, year, and stated date of Mar 02, 2023  Attention:  Good  Concentration:  Good  Memory:  WNL  Fund of knowledge:   Good  Insight:    Good and Fair  Judgment:   Good  Impulse Control:  Good   Risk Assessment: Danger to Self:  No Self-injurious Behavior: No Danger to Others: No Duty to Warn:no Physical  Aggression / Violence:No  Access to Firearms a concern: No  Gang Involvement:No   Subjective:  Patient today reporting anxiety and grief "but getting some better."  Still reluctant to leave the house at times. But is developing some routines as noted in earlier sessions that is "helping her get out of house a little more, not crying quite as much, can look at deceased husband's picture and not get quite as upset." Reports talking through her grief and anxieties is helping . We also discussed some other behaviors that can help her feel better emotionally and more connected to others including: getting outside more, going to the gym and participating with others there in activities offered, listen to her music that she likes, go to a movie or a play, spending time with her pet dog, and being in more frequent contact with family, friends, and neighbors. Some decrease in her grief/sadness/anxiety. Adult sons and families are supportive of her. She is noticing more progress. Talked further about a confidential issue with one of her grandsons and is feeling some better about that situation "very gradually". Motivated to continue working on her grief and anxieties and feels positive about progress she is noticing in herself. Increased strength and increased belief in herself. Working to improve her speaking in [place] as she does have a "some broken [place] and a strong accent." Works well in session.    Interventions: Cognitive Behavioral Therapy, Ego-Supportive, and Grief Therapy  .Appropriately grieve the loss of husband in order to normalize mood and tor return to previous adaptive  level of functioning.  2. Verbalize an understanding of the relationship between repressed anger and depressed mood. Verbalize any unresolved grief issues that may be contributing to depression.  3. Identify cognitive self-talk that supports depressed and anxious mood; Replace negative self-defeating self-talk with verbalization of  realistic and positive cognitive messages that do not support depression.   Diagnosis:   ICD-10-CM   1. Generalized anxiety disorder  F41.1      Plan: Patient in session today (telehealth) and very actively involved in session as she focused more on her anxiety and unresolved grief. Smiled more today in session and actually laughed a couple times. She continues to work on her goals and is showing progress. Needs to continue working with her goal directed behaviors to keep moving in a positive direction.  Still pretty easily distracted but states she feels her focus is getting better.  Continues working with more strength as she copes with the death of her husband.  Encouraged patient in practicing more self affirming and positive behaviors including: Staying in contact with family and extended family/friends that want to support her, getting outside some each day and walking, noticing at least 1 positive daily, allow her faith to be an emotional support as well as spiritual, challenging counteract her self-doubt, reduce overthinking and over analyzing, refrain from self negating, practice more intentional listening to others that care about her, practice "the pause" as needed, consistent positive self talk, refrain from assuming worst-case scenarios, healthy nutrition and exercise, stay on her prescribed medication, let her faith be a resource for her emotionally as well as spiritually, and recognize the strength she shows working with goal-directed behaviors to move in a direction that supports increased self-confidence, her improved emotional health, and overall wellbeing.  Next session is to be in person here at the office versus telehealth MyChart video.  Goal review and progress/challenges noted with patient.  Next appt within 1-2 weeks.  Mathis Fare, LCSW

## 2023-03-15 ENCOUNTER — Ambulatory Visit: Payer: Medicare Other | Admitting: Psychiatry

## 2023-03-29 ENCOUNTER — Ambulatory Visit (INDEPENDENT_AMBULATORY_CARE_PROVIDER_SITE_OTHER): Payer: Medicare Other | Admitting: Psychiatry

## 2023-03-29 DIAGNOSIS — F411 Generalized anxiety disorder: Secondary | ICD-10-CM

## 2023-03-29 NOTE — Progress Notes (Signed)
[facility] Counselor/Therapist Progress Note  Patient ID: Mary Meyers, MRN: [id number],    Date: 03/29/2023  Time Spent: 55 minutes   Treatment Type: Individual Therapy  Reported Symptoms: depression, anxiety, grief  Mental Status Exam:  Appearance:   Casual     Behavior:  Appropriate, Sharing, and Motivated  Motor:  Normal  Speech/Language:   Clear and Coherent  Affect:  anxious  Mood:  anxious  Thought process:  goal directed  Thought content:    Rumination  Sensory/Perceptual disturbances:    WNL  Orientation:  oriented to person, place, time/date, situation, day of week, month of year, year, and stated date of March 29, 2023  Attention:  Good  Concentration:  Good and Fair  Memory:  WNL  Fund of knowledge:   Good  Insight:    Good and Fair  Judgment:   Good  Impulse Control:  Good   Risk Assessment: Danger to Self:  No Self-injurious Behavior: No Danger to Others: No Duty to Warn:no Physical Aggression / Violence:No  Access to Firearms a concern: No  Gang Involvement:No   Subjective:   Patient in session today and reporting anxiety, depression, and grief.  She feels anxiety and grief are her stronger symptoms but also closely followed by the depression.  Denies any thoughts of harm to self or anyone.  "I think I'm getting better but still have some triggers."  Very affected by anything heard or seen "like on TV that relates to someone dying." Does acknowledge that she has stayed at home and inside the house more recently, which added to her sadness and depressive feelings. Also decreased her contacts some with others and not getting out of the house as much. Needing to develop more routines to help her get placed in the midst of other people where she can benefit from more support. Now seeing that taking steps back and isolating herself is not helping her and talked through this today, looking at how she can reverse this again and put herself more in the midst  of other people and have more of a sense of connectedness. Encouraged to check out other resources for activities that she might enjoy and also offer patient ways to meet other adults.  Seemed more hopeful and likely to follow through on strategies discussed today by end of session.  Family does have upcoming [place] planned [place] of patient's husband.  Patient expresses how this may be difficult for her but she feels it may give the mall some closure.  Continues working on improving her speaking in [place] as she does have a very broken accent and speaks Spanish more fluently.  May consider some volunteer work as she shared today.   Interventions: Cognitive Behavioral Therapy and Ego-Supportive  1.Appropriately grieve the loss of husband in order to normalize mood and tor return to previous adaptive level of functioning.  2. Verbalize an understanding of the relationship between repressed anger and depressed mood. Verbalize any unresolved grief issues that may be contributing to depression.  3. Identify cognitive self-talk that supports depressed and anxious mood; Replace negative self-defeating self-talk with verbalization of realistic and positive cognitive messages that do not support depression.    Diagnosis:   ICD-10-CM   1. Generalized anxiety disorder  F41.1      Plan: Patient in session today working more on her anxiety, grief, and depression.  Is following through as she is able  on strategies and today seem to be a motivating time for her to try and "get back on track a little more" and consistently work with goal-directed behaviors, as she is making progress.  Continues to deny any SI.  Encouraged patient in her practice of more positive and self affirming behaviors including: Remaining in contact with family and extended family/friends that support her, getting outside some each day and walking, noticing at least 1 positive each day, allow her  faith to be an emotional support, challenge and counteract her self-doubt, reduce overthinking and over analyzing, refrain from self negating, practice more intentional listening to others that care about her, practice "the pause" as needed, positive self talk, refrain from assuming worst-case scenarios, healthy nutrition and exercise, stay on her prescribed medication, and recognize the strengths she shows working with goal-directed behaviors to move in a direction that supports her improved emotional health, self-confidence, and outlook.  Self rating scales: (Updated 03/29/2023) 1-10 depression scale-5/6 1-10 anxiety scale-7/8 1-10 self-esteem scale-4/5 1-10 motivation scale-5 1-10 hopefulness scale-5  Goal review and progress/challenges noted with patient.  Next appointment within 3 weeks.   Mathis Fare, LCSW

## 2023-04-03 ENCOUNTER — Encounter: Payer: Self-pay | Admitting: Pulmonary Disease

## 2023-04-07 ENCOUNTER — Ambulatory Visit (INDEPENDENT_AMBULATORY_CARE_PROVIDER_SITE_OTHER): Payer: Medicare Other | Admitting: Pulmonary Disease

## 2023-04-07 ENCOUNTER — Encounter: Payer: Self-pay | Admitting: Pulmonary Disease

## 2023-04-07 VITALS — BP 136/84 | HR 54 | Temp 97.8°F | Ht 63.0 in | Wt 209.2 lb

## 2023-04-07 DIAGNOSIS — R0602 Shortness of breath: Secondary | ICD-10-CM | POA: Diagnosis not present

## 2023-04-07 DIAGNOSIS — G4733 Obstructive sleep apnea (adult) (pediatric): Secondary | ICD-10-CM | POA: Diagnosis not present

## 2023-04-07 DIAGNOSIS — E669 Obesity, unspecified: Secondary | ICD-10-CM | POA: Diagnosis not present

## 2023-04-07 DIAGNOSIS — I272 Pulmonary hypertension, unspecified: Secondary | ICD-10-CM | POA: Diagnosis not present

## 2023-04-07 NOTE — Progress Notes (Signed)
Subjective:    Patient ID: Mary Meyers, female    DOB: 08/21/1946, 77 y.o.   MRN: [id number]  Patient Care Team: Dorothey Baseman, MD as PCP - General (Family Medicine) Marykay Lex, MD as PCP - Cardiology (Cardiology) Benita Gutter, RN as Registered Nurse  Chief Complaint  Patient presents with   Follow-up    Much better. DOE. Dry cough. No wheezing. CPAP is good, no problems.    HPI Patient is a 77 year old remote former smoker with minimal tobacco exposure in the past, who presents for follow-up on the issue of shortness of breath.  She was last seen by me on 23 December 2022, at that time she had CPAP ordered for moderate obstructive sleep apnea.  Today she presents for scheduled follow-up.  She notes that the CPAP has been helping her tremendously.  She notes that this has also helped her dyspnea during the day as she feels less fatigued.  She uses albuterol rarely but when she does it appears that it may help her some.  However she notes that her dyspnea has been more responsive to increasing activity and some weight loss.  She is to discuss with her primary provider as to medications that may help her with weight loss.  She is being treated for depression which occurred after the death of her husband on 2024/12/15and she appears to be better compensated in this regard.  She is attending a bereavement group and this has helped her.  She is also seeing a psychologist and is also on antidepressants.  This has helped as well.  She does not endorse cough, sputum production nor hemoptysis. No lower extremity edema, no calf tenderness.  No orthopnea or paroxysmal dyspnea.  She is tolerating CPAP better.  Compliance from 19 May through 17 June showed that she wore CPAP 25 out of 30 days for 83% and 63% over 4 hours.  She states that this is getting better for her and she notes better night sleep now that she is getting used to the device.  She states that she "loves it".  Her  residual AHI on CPAP is 2.1.  She is on CPAP at 11 cm H2O.   DATA 04/15/2022 chest x-ray PA and lateral: Prominent right hilar region, unchanged, otherwise no active cardiopulmonary disease. 05/25/2022 echocardiogram: LVEF 60 to 65%, mild LVH, rate 1 DD, RV size moderately enlarged, trivial aortic valve regurgitation, dilated inferior vena cava suggesting right atrial pressure of 15 mgHg. 11/19/2022 PFTs: FEV1 1.91 L or 101% predicted, FVC 2.54 L or 100% predicted, FEV1/FVC 75%, volumes normal.  Diffusion capacity normal.  No significant bronchodilator response.  Minimal if any, obstruction but for the most part study is normal.  Study does not explain the patient's dyspnea. 11/28/2022 split-night sleep study: Moderate obstructive sleep apnea with AHI of 22 and SpO2 low of 78%.  She did well with CPAP at 11 cm H2O.  She was fitted with a medium large Fisher-Paykel nasal pillow provided a mask and heated humidity, chinstrap was provided. 12/16/2022 right heart cath: Hemodynamic findings consistent with mild pulmonary hypertension.  Measured numbers do not correlate with estimated numbers on 2D echo.  Review of Systems A 10 point review of systems was performed and it is as noted above otherwise negative.   Patient Active Problem List   Diagnosis Date Noted   Pulmonary hypertension (HCC) 12/16/2022   Chronic dyspnea 11/05/2022   PMB (postmenopausal bleeding) 03/18/2021   B12 deficiency 11/21/2020  Elevated glucose 11/21/2020   Vertigo 04/09/2019   History of pulmonary embolism 01/20/2018   Endometrial cancer (HCC) 12/13/2017   S/P TAH-BSO 11/14/2017   Hypertension 11/01/2017   Bradycardia 10/03/2017   Osteoarthritis of knee 02/07/2017   Tremor 10/29/2016   Arthritis 05/17/2016   Depression 05/17/2016   Gastroesophageal reflux disease without esophagitis 05/17/2016   Acquired hypothyroidism 09/22/2015   Hyperlipidemia 09/22/2015   Cough 03/27/2013   Abnormal chest CT 03/27/2013     Social History   Tobacco Use   Smoking status: Former    Types: Cigarettes    Quit date: 10/19/1971    Years since quitting: 51.5   Smokeless tobacco: Never  Substance Use Topics   Alcohol use: Not Currently    Allergies  Allergen Reactions   Cortisone Hives and Other (See Comments)    Burning/swelling.   Iodinated Contrast Media Other (See Comments)    Burning/swelling 11/15/22 - contrasted cardiac CTA w/ 13 hr prep , called back on 11/16/22 with itching, hives, swelling to face. Instructed patient to never have contrast ever again due to breakthru reaction   Prednisone    Tramadol Nausea And Vomiting and Other (See Comments)    dizziness    Current Meds  Medication Sig   albuterol (VENTOLIN HFA) 108 (90 Base) MCG/ACT inhaler Inhale 2 puffs into the lungs every 6 (six) hours as needed.    apixaban (ELIQUIS) 5 MG TABS tablet Take 5 mg by mouth 2 (two) times daily. Pt is instructed to take when traveling by plane more than 5 hours   aspirin EC 81 MG tablet Take 81 mg by mouth daily. Swallow whole.   atorvastatin (LIPITOR) 40 MG tablet Take 40 mg by mouth daily.   buPROPion (WELLBUTRIN XL) 150 MG 24 hr tablet Take 150 mg by mouth daily.   celecoxib (CELEBREX) 200 MG capsule Take 200 mg by mouth daily.   cetirizine (ZYRTEC) 10 MG tablet Take 10 mg by mouth daily.   furosemide (LASIX) 20 MG tablet Take 20 mg by mouth as needed.   levothyroxine (SYNTHROID) 112 MCG tablet Take 112 mcg by mouth daily.   LORazepam (ATIVAN) 0.5 MG tablet    meclizine (ANTIVERT) 25 MG tablet Take 25 mg by mouth 3 (three) times daily as needed (for vertigo).   Multiple Vitamins-Minerals (PX COMPLETE SENIOR MULTIVITS) TABS Take by mouth.   pantoprazole (PROTONIX) 40 MG tablet Take 40 mg by mouth 2 (two) times daily.    potassium chloride (KLOR-CON) 10 MEQ tablet Take by mouth. Take 1 tablet (10 mEq total) by mouth once daily   sucralfate (CARAFATE) 1 g tablet Take 1 g by mouth as needed.     triamcinolone cream (KENALOG) 0.1 % Apply 1 application topically 2 (two) times daily as needed.   triamterene-hydrochlorothiazide (MAXZIDE) 75-50 MG per tablet Take 0.5 tablets by mouth daily.    venlafaxine XR (EFFEXOR-XR) 150 MG 24 hr capsule Take 150 mg by mouth daily.    Immunization History  Administered Date(s) Administered   Fluad Quad(high Dose 65+) 06/18/2022   Influenza Split 10/23/2014   Influenza, High Dose Seasonal PF 06/24/2017, 07/07/2018   Influenza,inj,Quad PF,6+ Mos 08/07/2019   Influenza,inj,quad, With Preservative 07/17/2018   Influenza-Unspecified 10/23/2014, 10/25/2016, 06/24/2017, 07/08/2020, 08/04/2021   PFIZER Comirnaty(Gray Top)Covid-19 Tri-Sucrose Vaccine 11/03/2019, 11/27/2019, 09/16/2020, 03/17/2021   Pneumococcal Conjugate-13 04/14/2018   Pneumococcal Polysaccharide-23 04/09/2019   Tdap 04/06/2018   Unspecified SARS-COV-2 Vaccination 07/03/2021        Objective:  BP 136/84 (BP Location: Left Arm, Cuff Size: Large)   Pulse (!) 54   Temp 97.8 F (36.6 C)   Ht 5\' 3"  (1.6 m)   Wt 209 lb 3.2 oz (94.9 kg)   SpO2 96%   BMI 37.06 kg/m   SpO2: 96 % O2 Device: None (Room air)  GENERAL: Obese woman, no acute distress, no conversational dyspnea, fully ambulatory. HEAD: Normocephalic, atraumatic.  EYES: Pupils equal, round, reactive to light.  No scleral icterus.  MOUTH: Upper dentures, lower dentures, oral mucosa moist. NECK: Supple. No thyromegaly. Trachea midline. No JVD.  No adenopathy. PULMONARY: Good air entry bilaterally.  No adventitious sounds. CARDIOVASCULAR: S1 and S2. Regular rate and rhythm.  No rubs, murmurs or gallops heard.   ABDOMEN: Obese otherwise, benign. MUSCULOSKELETAL: No joint deformity, no clubbing, no edema.  NEUROLOGIC: No overt focal deficit, no gait disturbance, speech is fluent. SKIN: Intact,warm,dry. PSYCH: Mood and behavior normal.      Assessment & Plan:     ICD-10-CM   1. OSA (obstructive sleep apnea)   G47.33    Continue CPAP She is adapting well to it She notes benefit of therapy    2. Pulmonary hypertension (HCC)  I27.20    Very mild Should respond to OSA management Should respond to weight loss    3. SOB (shortness of breath)  R06.02    Improved Obesity/deconditioning add to issue Sleep deprivation was adding to issue Patient notes improvement after CPAP    4. Obesity, Class II, BMI 35-39.9  E66.9    Patient notes difficulty losing weight She is to discuss with primary MD re: Options      Will see the patient in follow-up in 4 to 6 months time call sooner should any new difficulties arise.   Gailen Shelter, MD Advanced Bronchoscopy [facility]    *This note was dictated using voice recognition software/Dragon.  Despite best efforts to proofread, errors can occur which can change the meaning. Any transcriptional errors that result from this process are unintentional and may not be fully corrected at the time of dictation.

## 2023-04-07 NOTE — Patient Instructions (Addendum)
Continue using your CPAP.  Continue using your albuterol (rescue inhaler) as needed for shortness of breath.  We will see you in follow-up in 4 to 6 time, do call sooner should any new problems arise.    Contine usando su CPAP.  Contine usando su albuterol (inhalador de rescate) segn sea necesario para la dificultad para respirar.  Nos veremos en el seguimiento dentro de 4 a 6 veces; llame antes si surge algn problema nuevo.

## 2023-04-12 ENCOUNTER — Ambulatory Visit: Payer: Medicare Other | Admitting: Psychiatry

## 2023-06-17 ENCOUNTER — Other Ambulatory Visit: Payer: Self-pay

## 2023-06-17 ENCOUNTER — Other Ambulatory Visit
Admission: RE | Admit: 2023-06-17 | Discharge: 2023-06-17 | Disposition: A | Discharge status: Home / Self Care | Payer: Medicare Other | Source: Ambulatory Visit | Attending: Primary Care | Admitting: Primary Care

## 2023-06-17 ENCOUNTER — Emergency Department: Payer: Medicare Other

## 2023-06-17 ENCOUNTER — Emergency Department: Admission: EM | Admit: 2023-06-17 | Payer: Medicare Other | Source: Home / Self Care

## 2023-06-17 ENCOUNTER — Ambulatory Visit: Admission: RE | Admit: 2023-06-17 | Payer: Medicare Other | Source: Ambulatory Visit

## 2023-06-17 ENCOUNTER — Ambulatory Visit: Payer: Medicare Other | Admitting: Primary Care

## 2023-06-17 ENCOUNTER — Telehealth: Payer: Self-pay | Admitting: Primary Care

## 2023-06-17 VITALS — BP 122/80 | HR 62 | Temp 97.8°F | Ht 63.0 in | Wt 204.6 lb

## 2023-06-17 DIAGNOSIS — R7989 Other specified abnormal findings of blood chemistry: Secondary | ICD-10-CM | POA: Insufficient documentation

## 2023-06-17 DIAGNOSIS — R251 Tremor, unspecified: Secondary | ICD-10-CM

## 2023-06-17 DIAGNOSIS — R0602 Shortness of breath: Secondary | ICD-10-CM

## 2023-06-17 DIAGNOSIS — I10 Essential (primary) hypertension: Secondary | ICD-10-CM | POA: Diagnosis not present

## 2023-06-17 DIAGNOSIS — Z86711 Personal history of pulmonary embolism: Secondary | ICD-10-CM | POA: Diagnosis not present

## 2023-06-17 DIAGNOSIS — X58XXXA Exposure to other specified factors, initial encounter: Secondary | ICD-10-CM | POA: Insufficient documentation

## 2023-06-17 DIAGNOSIS — S8012XA Contusion of left lower leg, initial encounter: Secondary | ICD-10-CM | POA: Diagnosis not present

## 2023-06-17 DIAGNOSIS — R0600 Dyspnea, unspecified: Secondary | ICD-10-CM | POA: Insufficient documentation

## 2023-06-17 DIAGNOSIS — R0609 Other forms of dyspnea: Secondary | ICD-10-CM

## 2023-06-17 DIAGNOSIS — Z1152 Encounter for screening for COVID-19: Secondary | ICD-10-CM | POA: Insufficient documentation

## 2023-06-17 LAB — MAGNESIUM: Magnesium: 1.9 mg/dL (ref 1.7–2.4)

## 2023-06-17 LAB — BASIC METABOLIC PANEL
Anion gap: 12 (ref 5–15)
BUN: 31 mg/dL — ABNORMAL HIGH (ref 8–23)
CO2: 27 mmol/L (ref 22–32)
Calcium: 9.1 mg/dL (ref 8.9–10.3)
Chloride: 97 mmol/L — ABNORMAL LOW (ref 98–111)
Creatinine, Ser: 1.08 mg/dL — ABNORMAL HIGH (ref 0.44–1.00)
GFR, Estimated: 53 mL/min — ABNORMAL LOW (ref >60)
Glucose, Bld: 105 mg/dL — ABNORMAL HIGH (ref 70–99)
Potassium: 3.2 mmol/L — ABNORMAL LOW (ref 3.5–5.1)
Sodium: 136 mmol/L (ref 135–145)

## 2023-06-17 LAB — TSH: TSH: 3.378 u[IU]/mL (ref 0.350–4.500)

## 2023-06-17 LAB — SARS CORONAVIRUS 2 BY RT PCR: SARS Coronavirus 2 by RT PCR: NEGATIVE

## 2023-06-17 LAB — D-DIMER, QUANTITATIVE: D-Dimer, Quant: 1.36 ug{FEU}/mL — ABNORMAL HIGH (ref 0.00–0.50)

## 2023-06-17 NOTE — ED Provider Notes (Signed)
[facility] Provider Note    Event Date/Time   First MD Initiated Contact with Patient 06/17/23 2122     (approximate)  History   Chief Complaint: Abnormal Lab  HPI  Mary Meyers is a 77 y.o. female with a past medical history of anxiety, gastric reflux, hypertension, hyperlipidemia, presents to the emergency department for a positive D-dimer.  According to the patient for the past week or so she has noticed some shortness of breath at times.  States she is also noted some bruising to her left leg.  Patient states a history of a prior pulmonary embolism many years ago is no longer on any blood thinners.  Patient was seen by her doctor today and had lab work performed showing a positive D-dimer was referred to the emergency department for further evaluation.  Patient denies any chest pain.  No pleuritic pain.  No fever or cough.  Physical Exam   Triage Vital Signs: ED Triage Vitals [06/17/23 1717]  Encounter Vitals Group     BP (!) 148/91     Systolic BP Percentile      Diastolic BP Percentile      Pulse Rate 63     Resp 18     Temp 98.4 F (36.9 C)     Temp Source Oral     SpO2 96 %     Weight      Height      Head Circumference      Peak Flow      Pain Score 0     Pain Loc      Pain Education      Exclude from Growth Chart     Most recent vital signs: Vitals:   06/17/23 1717  BP: (!) 148/91  Pulse: 63  Resp: 18  Temp: 98.4 F (36.9 C)  SpO2: 96%    General: Awake, no distress.  CV:  Good peripheral perfusion.  Regular rate and rhythm  Resp:  Normal effort.  Equal breath sounds bilaterally.  Abd:  No distention.  Soft, nontender.  No rebound or guarding. Other:  Small area of bruising to the left lower extremity 3 cm or so in diameter.  No swelling/edema or tenderness to the calfs.   ED Results / Procedures / Treatments   RADIOLOGY  Ultrasound is negative for DVT   MEDICATIONS ORDERED IN ED: Medications - No data to  display   IMPRESSION / MDM / ASSESSMENT AND PLAN / ED COURSE  I reviewed the triage vital signs and the nursing notes.  Patient's presentation is most consistent with acute presentation with potential threat to life or bodily function.  Patient presents to the emergency department for positive D-dimer.  Patient has been seen by her doctor today for 1 week of dyspnea on exertion as well as some bruising to her left leg.  Given her history of prior PE they did check a D-dimer that was positive and they sent the patient to the emergency department.  The remainder of the patient's outpatient lab work showed no concerning findings on her chemistry.  Unfortunately patient states an allergy to IV contrast as well as an allergy to steroids.  Given the patient's allergy to both steroids and IV contrast we will proceed with a VQ scan to further evaluate.  Will obtain a left lower extremity ultrasound to rule out DVT.  Given the elevated D-dimer we will obtain a COVID swab as a precaution as well as a  chest x-ray to evaluate for any other causes of shortness of breath such as pneumonia or pneumothorax.  Patient denies any shortness of breath currently, satting in the upper 90s on room air, no distress speaking in full sentences.  Ultrasound is negative for DVT.  Chest x-ray is clear.  VQ scan pending, patient care signed out to oncoming provider.  FINAL CLINICAL IMPRESSION(S) / ED DIAGNOSES   Dyspnea   Note:  This document was prepared using Dragon voice recognition software and may include unintentional dictation errors.   Minna Antis, MD 06/17/23 2242

## 2023-06-17 NOTE — Telephone Encounter (Signed)
I have notified the patient. She will go to the ED now.  Nothing further needed.
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
## 2023-06-17 NOTE — Assessment & Plan Note (Signed)
-   Complains of chest pressure/tightness and shortness of breath - Hx PE, not currently on Eliquis (only take when traveling) - Checking D-dimer

## 2023-06-17 NOTE — ED Triage Notes (Addendum)
Pt sent by pulmonologist for concern of DVT/PE. Pt reports DOE and bruise/swelling on left lower leg, D-dimer was elevated today. Pt is AOX4, respirations even and unlabored. Purple bruise noted on left lower leg, no swelling redness, heat, or tenderness noted.

## 2023-06-17 NOTE — Patient Instructions (Addendum)
Exam was benign; you do have a tremor but neuro exam was intact  Glucose was normal EKG was unchanged Checking labs and Cxr Referring back to neurology   Recommendations: Continue Symbicort 2 puffs morning and evening Continue Lasix 20mg  daily as needed for leg swelling Continue Wellbutrin and Effexor as directed  Take Ativan 0.5mg  as needed for anxiety / would recommend taking when you get home if not driving  Continue to wear CPAP nightly 4-6 hours   Orders: POC glucose (done) ECG (done)  Labs and CXR today  Referral: Neurology re: head tremor (ordered)  Follow-up: 4-6 week follow-up Dr. Jayme Cloud

## 2023-06-17 NOTE — Assessment & Plan Note (Signed)
-   Stable; BP 120/78 provider check

## 2023-06-17 NOTE — Assessment & Plan Note (Addendum)
-   Unclear etiology; deconditioning and anxiety likely contributing  - Continue Symbicort two puffs twice daily - EKG unchanged from baseline  - Checking CXR and labs (including d-dimer)

## 2023-06-17 NOTE — Telephone Encounter (Signed)
D-dimer was elevated, she has a hx of PE and is complaining of chest pressure and sob. Needs to go to ED for CTA.

## 2023-06-17 NOTE — Assessment & Plan Note (Signed)
-   Patient has noticeable head tremor and left eye twitch on exam. Neuro's grossly intact.  - MRI March 2024 showed no evidence of acute intracranial abnormality, mild chronic small vessel ischemic changes, mild generalized cerebral atrophy  - Checking basic labs along with TSH, B12, magnesium  - Referring back to neurology

## 2023-06-18 ENCOUNTER — Emergency Department: Payer: Medicare Other

## 2023-06-18 DIAGNOSIS — R0600 Dyspnea, unspecified: Secondary | ICD-10-CM | POA: Diagnosis not present

## 2023-06-18 LAB — VITAMIN B12: Vitamin B-12: 769 pg/mL (ref 180–914)

## 2023-06-18 MED ORDER — TECHNETIUM TO 99M ALBUMIN AGGREGATED
4.0000 | Freq: Once | INTRAVENOUS | Status: AC | PRN
  Administered 2023-06-18: 3.91 via INTRAVENOUS

## 2023-06-18 NOTE — ED Provider Notes (Signed)
VQ scan is read as low probability are normal.  Patient does appear stable and appropriate for outpatient follow-up.   Willy Eddy, MD 06/18/23 1013

## 2023-06-18 NOTE — ED Provider Notes (Signed)
-----------------------------------------   6:37 AM on 06/18/2023 -----------------------------------------   No events overnight.  VQ scan was not available overnight.  Patient awaiting VQ scan and disposition based on its results.  Will transfer care to the oncoming provider at change of shift.   Irean Hong, MD 06/18/23 [phone]

## 2023-06-18 NOTE — ED Notes (Signed)
Pt now going for V/Q scan.

## 2023-06-18 NOTE — ED Notes (Signed)
[facility] called, on way to come do V/Q scan this AM, they will just need an open IV, also they talked to [facility].

## 2023-06-23 ENCOUNTER — Telehealth: Payer: Self-pay | Admitting: Primary Care

## 2023-06-23 NOTE — Telephone Encounter (Signed)
Lm x 1 for the patient.  Beth did you try to contact this patient?

## 2023-06-23 NOTE — Telephone Encounter (Signed)
Patient is returning [name] NP phone call. Patient phone number is [phone].

## 2023-06-24 NOTE — Telephone Encounter (Signed)
I spoke with the patient. She said she is feeling better but she still has a headache. She is scheduled to see her PCP on Monday. She will see them Monday and let us know if they think she needs to see [place] before her appt in October.  Nothing further needed.

## 2023-06-24 NOTE — Telephone Encounter (Signed)
No I haven't

## 2023-07-15 ENCOUNTER — Ambulatory Visit: Payer: Medicare Other | Admitting: Adult Health

## 2023-08-08 ENCOUNTER — Ambulatory Visit: Payer: Medicare Other | Admitting: Pulmonary Disease

## 2023-08-18 ENCOUNTER — Encounter: Payer: Self-pay | Admitting: Pulmonary Disease

## 2023-08-18 ENCOUNTER — Ambulatory Visit (INDEPENDENT_AMBULATORY_CARE_PROVIDER_SITE_OTHER): Payer: Medicare Other | Admitting: Pulmonary Disease

## 2023-08-18 VITALS — BP 110/62 | HR 52 | Temp 96.6°F | Ht 63.0 in | Wt 205.2 lb

## 2023-08-18 DIAGNOSIS — J683 Other acute and subacute respiratory conditions due to chemicals, gases, fumes and vapors: Secondary | ICD-10-CM | POA: Diagnosis not present

## 2023-08-18 DIAGNOSIS — R0609 Other forms of dyspnea: Secondary | ICD-10-CM

## 2023-08-18 DIAGNOSIS — G4733 Obstructive sleep apnea (adult) (pediatric): Secondary | ICD-10-CM

## 2023-08-18 DIAGNOSIS — I272 Pulmonary hypertension, unspecified: Secondary | ICD-10-CM | POA: Diagnosis not present

## 2023-08-18 DIAGNOSIS — M1711 Unilateral primary osteoarthritis, right knee: Secondary | ICD-10-CM

## 2023-08-18 DIAGNOSIS — F4329 Adjustment disorder with other symptoms: Secondary | ICD-10-CM

## 2023-08-18 DIAGNOSIS — E66812 Obesity, class 2: Secondary | ICD-10-CM

## 2023-08-18 NOTE — Patient Instructions (Addendum)
VISIT SUMMARY:  During today's visit, we discussed your ongoing health concerns and reviewed your current treatment plans. You reported improvement in your shortness of breath and shared your experiences with knee pain, CPAP mask irritation, and hammertoe. We also talked about your emotional well-being following the loss of your spouse.  YOUR PLAN:  -SHORTNESS OF BREATH: Shortness of breath means you have difficulty breathing. You have reported improvement with your daily slow walks and the use of Symbicort. Continue using Symbicort twice daily and use the Albuterol inhaler for emergencies.  -KNEE OSTEOARTHRITIS: Knee osteoarthritis is a condition where the cartilage in your knee wears down, causing pain and stiffness. Continue your current pain management and physical activity regimen. Your surgery is planned for February, and you should discuss your hammertoe concerns with your orthopedic surgeon.  -SLEEP APNEA: Sleep apnea is a condition where your breathing stops and starts during sleep. Continue using your CPAP machine and await the new nasal mask from [facility].  INSTRUCTIONS:  Please follow up in three months or sooner if new issues arise.   RESUMEN DE LA VISITA:  Durante la visita de hoy, analizamos sus preocupaciones de salud actuales y revisamos sus planes de tratamiento actuales. Usted inform [name] en su dificultad para respirar y comparti sus experiencias con dolor de rodilla, irritacin de la mscara CPAP y dedo [name]. Tambin hablamos sobre su bienestar emocional tras la prdida de su cnyuge.  TU PLAN:  -FALTA DE RESPIRACIN: La dificultad para respirar significa que tienes dificultad para respirar. Ha informado de una mejora con sus caminatas lentas diarias y el uso de Symbicort. Contine [name] al da y use el inhalador de Albuterol en caso de [name].  -OSTEOARTRITIS DE RODILLA: La osteoartritis de rodilla es una afeccin en la que el  cartlago de la rodilla se desgasta, provocando dolor y rigidez. Contine con su rgimen actual de manejo del dolor y [name] fsica. Su ciruga est planificada para febrero y debe discutir sus inquietudes sobre el dedo en martillo con su cirujano ortopdico.  -APNEA DEL SUEO: La apnea del sueo es una afeccin en la que la respiracin se detiene y comienza durante el sueo. Contine usando su mquina CPAP y espere la nueva mascarilla nasal de [name].  INSTRUCCIONES:  Haga un seguimiento en tres meses o antes si surgen nuevos problemas.

## 2023-08-19 ENCOUNTER — Encounter: Payer: Self-pay | Admitting: Pulmonary Disease

## 2023-09-13 ENCOUNTER — Telehealth: Payer: Self-pay | Admitting: Pulmonary Disease

## 2023-09-13 DIAGNOSIS — G4733 Obstructive sleep apnea (adult) (pediatric): Secondary | ICD-10-CM

## 2023-09-13 NOTE — Telephone Encounter (Signed)
Lmtcb.  DME Order placed to [facility].

## 2023-09-13 NOTE — Telephone Encounter (Signed)
May send prescription for [employer] DreamWear nasal CPAP mask.  No substitutions.

## 2023-09-13 NOTE — Telephone Encounter (Signed)
Pt is having a allergic reaction to CPAP mask, pls call [facility] at [phone] to send in order for only the nose piece

## 2023-09-14 NOTE — Telephone Encounter (Signed)
Patient advised. Nothing further needed.

## 2023-09-28 ENCOUNTER — Telehealth: Payer: Self-pay | Admitting: Pulmonary Disease

## 2023-09-28 NOTE — Telephone Encounter (Signed)
Lm x1 for the patient. I do not know who reached out to her.

## 2023-09-28 NOTE — Telephone Encounter (Signed)
PT is ret a message left to call us. No name was left. Please see if anyone knows why.

## 2023-09-29 NOTE — Telephone Encounter (Signed)
Thanks for trying. NFN

## 2023-10-18 ENCOUNTER — Other Ambulatory Visit: Payer: Self-pay | Admitting: Family Medicine

## 2023-10-18 DIAGNOSIS — Z1231 Encounter for screening mammogram for malignant neoplasm of breast: Secondary | ICD-10-CM

## 2023-10-26 ENCOUNTER — Telehealth: Payer: Self-pay | Admitting: Cardiology

## 2023-10-26 NOTE — Telephone Encounter (Signed)
   Pre-operative Risk Assessment    Patient Name: Clarise Chacko  DOB: 07-Mar-1946 MRN: [id number]  Date of last office visit 01/07/23 Date of next office visit: 12/2023   Request for Surgical Clearance    Procedure:   NOT INDICATED  Date of Surgery:  Clearance TBD                                 Surgeon:  DR MEMORY Socks Group or Practice Name:  [facility] Phone number:  [phone] Fax number:  [phone]  Type of Clearance Requested:   - Medical    Type of Anesthesia:  Not Indicated   Additional requests/questions:    Signed, Samule LITTIE Bristol   10/26/2023, 4:26 PM

## 2023-10-27 NOTE — Telephone Encounter (Signed)
 Pre-op team,  Please contact the requesting office for more information regarding what type of procedure patient is having.   Thank you!  DW

## 2023-10-27 NOTE — Telephone Encounter (Signed)
 Tried calling [facility] for more information regarding patient's procedure no answer left a vm to call back.

## 2023-10-28 NOTE — Telephone Encounter (Signed)
 2nd attempt to reach the surgeon office as we are needing clarify procedure to be done, also type of anesthesia being used as well as if blood thinners Eliquis  and ASA need to be held as not indicated.   As we have tried to reach the surgeon office x 2 to confirm this needed information. I will send a fax to their office asking for their reply. We cannot proceed with surgery clearance until we have complete information.

## 2023-10-31 ENCOUNTER — Telehealth: Payer: Self-pay | Admitting: Cardiology

## 2023-10-31 NOTE — Telephone Encounter (Signed)
 error

## 2023-10-31 NOTE — Telephone Encounter (Signed)
 Will send message again to requesting office that we also need to know the procedure that will be done.

## 2023-10-31 NOTE — Telephone Encounter (Signed)
 Caller Marchelle Folks) stated she was returning Pre-op call.  Caller noted she will also re-fax documentation.

## 2023-10-31 NOTE — Telephone Encounter (Signed)
 Update sent Spinal Anesthesia will be given. No medication documented

## 2023-10-31 NOTE — Telephone Encounter (Signed)
 Andrey Campanile, Jasmin BJust now (10:30 AM)   JW Caller Marchelle Folks) stated she was returning Pre-op call.  Caller noted she will also re-fax documentation.      Note   [facility] [phone] [id number]  Annetta Maw

## 2023-11-01 NOTE — Telephone Encounter (Signed)
 Pharmacy please advise on holding Eliquis prior to right total knee after plasty scheduled for TBD. Thank you.

## 2023-11-01 NOTE — Telephone Encounter (Signed)
 I s/w Alan at ortho office. I confirmed procedure. Alan is going to fax over new request to the [phone]. I explained that we have 7 satellite offices and I am in the [facility]. Office as the request went to the [place] office. Once I have new form sent to me I will be sure to update the clearance form with needed missing information.

## 2023-11-01 NOTE — Telephone Encounter (Signed)
   Pre-operative Risk Assessment    Patient Name: Mary Meyers  DOB: 04-Apr-1946 MRN: [id number]   Date of last office visit: 01/07/23 DR. HARDING Date of next office visit: NONE   Request for Surgical Clearance    Procedure:   RIGHT TOTAL KNEE ARTHROPLASTY  Date of Surgery:  Clearance TBD                                Surgeon:  DR. RAOUL PATTEE Surgeon's Group or Practice Name:  [facility] Phone number:  [phone] ATTN: AMANDA BRON RT (R) Fax number:  [phone]   Type of Clearance Requested:   - Medical  - Pharmacy:  Hold Apixaban  (Eliquis )     Type of Anesthesia:  Spinal   Additional requests/questions:    Bonney Niels Jest   11/01/2023, 1:53 PM

## 2023-11-02 NOTE — Telephone Encounter (Signed)
 Tried calling patient again no answer left a detailed message to call back

## 2023-11-02 NOTE — Telephone Encounter (Signed)
 Patient with diagnosis of previous provoked PE (following airplane travel) on Eliquis . Pt is instructed to take only when traveling by plane more than 5 hours   Call pt to find out when was the last time she used Eliquis  N/A LVM  Procedure: RIGHT TOTAL KNEE ARTHROPLASTY Date of procedure: TBD

## 2023-11-03 NOTE — Telephone Encounter (Signed)
I will forward this to our preop APP to review notes about blood thinner only for traveling. I also do not see if the pt needs an appt.

## 2023-11-07 NOTE — Telephone Encounter (Signed)
   Name: Mary Meyers  DOB: 1946-10-09  MRN: [id number]  Primary Cardiologist: Bryan Lemma, MD   Preoperative team, please contact this patient and set up a phone call appointment for further preoperative risk assessment. Please obtain consent and complete medication review. Thank you for your help.  I confirm that guidance regarding antiplatelet and oral anticoagulation therapy has been completed and, if necessary, noted below.  Patient with diagnosis of previous provoked PE (following airplane travel) on Eliquis. Pt is instructed to take only when traveling by plane more than 5 hours. Please find out when was the last time she used Eliquis.  I also confirmed the patient resides in the state of [place]. As per [facility] Medical Board telemedicine laws, the patient must reside in the state in which the provider is licensed.   Joylene Grapes, NP 11/07/2023, 9:17 AM [facility]

## 2023-11-07 NOTE — Telephone Encounter (Signed)
2nd attempt to reach the pt to schedule a tele pre op appt.

## 2023-11-10 NOTE — Telephone Encounter (Signed)
Tried calling patient no answer left a vm to call back

## 2023-11-10 NOTE — Telephone Encounter (Signed)
Pt has opted to schedule in office appt with Dr. Herbie Baltimore 12/08/23. I will update the surgeon office.

## 2023-11-22 ENCOUNTER — Ambulatory Visit (INDEPENDENT_AMBULATORY_CARE_PROVIDER_SITE_OTHER): Payer: Medicare Other | Admitting: Pulmonary Disease

## 2023-11-22 ENCOUNTER — Encounter: Payer: Self-pay | Admitting: Pulmonary Disease

## 2023-11-22 VITALS — BP 118/80 | HR 57 | Temp 97.1°F | Ht 63.0 in | Wt 206.2 lb

## 2023-11-22 DIAGNOSIS — Z01811 Encounter for preprocedural respiratory examination: Secondary | ICD-10-CM | POA: Diagnosis not present

## 2023-11-22 DIAGNOSIS — J683 Other acute and subacute respiratory conditions due to chemicals, gases, fumes and vapors: Secondary | ICD-10-CM | POA: Diagnosis not present

## 2023-11-22 DIAGNOSIS — G4733 Obstructive sleep apnea (adult) (pediatric): Secondary | ICD-10-CM

## 2023-11-22 DIAGNOSIS — R0609 Other forms of dyspnea: Secondary | ICD-10-CM | POA: Diagnosis not present

## 2023-11-22 DIAGNOSIS — E66812 Obesity, class 2: Secondary | ICD-10-CM

## 2023-11-22 NOTE — Progress Notes (Signed)
 Subjective:    Patient ID: Mary Meyers, female    DOB: 12-12-1945, 78 y.o.   MRN: [id number]  Patient Care Team: Glover Lenis, MD as PCP - General (Family Medicine) Anner Lenis ORN, MD as PCP - Cardiology (Cardiology) Maurie Rayfield BIRCH, RN as Registered Nurse  Chief Complaint  Patient presents with   Follow-up    Surgical Clearance. DOE. No wheezing or cough. CPAP- changing mask. Pressure good.    BACKGROUND/INTERVAL:Patient is a 78 year old remote former smoker with minimal tobacco exposure in the past, who presents for follow-up on the issue of shortness of breath.  PMHx significant for HTN, pulmonary hypertension, GERD, hyperlipidemia, endometrial cancer, history of pulmonary embolism.  She was last seen by me on 18 August 2023.  She is on CPAPfor moderate obstructive sleep apnea.  Today she presents for scheduled follow-up.  She just returned from [place].  She requires preoperative risk assessment for right total knee replacement.  HPI Discussed the use of AI scribe software for clinical note transcription with the patient, who gave verbal consent to proceed.  History of Present Illness   Mary Meyers is a 78 year old female with chronic dyspnea, mild (borderline) pulmonary hypertension, deconditioning, obesity, and obstructive sleep apnea who presents for a preoperative evaluation for right total knee arthroplasty.  She is concerned about her age affecting surgical outcomes but reports significant pain due to bone-on-bone contact, indicating a need for the procedure.  She has chronic dyspnea, which she attributes primarily to deconditioning and obesity. This has been a persistent issue, impacting her ability to engage in physical activities.  She has obstructive sleep apnea, for which she uses a CPAP machine. During a recent trip to [place], she was unable to use her CPAP machine consistently due to a lack of available power outlets. She experiences  discomfort with the current nasal interface of her CPAP, describing it as causing 'humidity' and 'pimples'. She is interested in switching to a different nasal interface that might be more comfortable.  She is currently using Symbicort  as an inhaler for her airways reactivity.  She is requesting that her rescue inhaler of albuterol  be refilled.  She does not endorse any other symptomatology.  Her respiratory issues are mostly related to obesity and deconditioning with a minor component of airways reactivity.  She has moderate sleep apnea and her CPAP is set to 11 cm was H2O.  Her risk for potential complications for the proposed procedure is mild to moderate.  This risk cannot be further reduced.   DATA 04/15/2022 chest x-ray PA and lateral: Prominent right hilar region, unchanged, otherwise no active cardiopulmonary disease. 05/25/2022 echocardiogram: LVEF 60 to 65%, mild LVH, rate 1 DD, RV size moderately enlarged, trivial aortic valve regurgitation, dilated inferior vena cava suggesting right atrial pressure of 15 mgHg. 11/19/2022 PFTs: FEV1 1.91 L or 101% predicted, FVC 2.54 L or 100% predicted, FEV1/FVC 75%, volumes normal.  Diffusion capacity normal.  No significant bronchodilator response.  Minimal if any, obstruction but for the most part study is normal.  Study does not explain the patient's dyspnea. 11/28/2022 split-night sleep study: Moderate obstructive sleep apnea with AHI of 22 and SpO2 low of 78%.  She did well with CPAP at 11 cm H2O.  She was fitted with a medium large Fisher-Paykel nasal pillow provided a mask and heated humidity, chinstrap was provided. 12/16/2022 right heart cath: Hemodynamic findings consistent with mild pulmonary hypertension.  Measured numbers do not correlate with estimated numbers on 2D  echo. 06/17/2023 chest x-ray portable: Hyperinflation with no acute disease. 06/17/2023 VQ scan: Low probability for PE.  Review of Systems A 10 point review of systems was  performed and it is as noted above otherwise negative.   Patient Active Problem List   Diagnosis Date Noted   Pulmonary hypertension (HCC) 12/16/2022   Chronic dyspnea 11/05/2022   PMB (postmenopausal bleeding) 03/18/2021   B12 deficiency 11/21/2020   Elevated glucose 11/21/2020   Vertigo 04/09/2019   History of pulmonary embolism 01/20/2018   Endometrial cancer (HCC) 12/13/2017   S/P TAH-BSO 11/14/2017   Hypertension 11/01/2017   Bradycardia 10/03/2017   Osteoarthritis of knee 02/07/2017   Tremor 10/29/2016   Arthritis 05/17/2016   Depression 05/17/2016   Gastroesophageal reflux disease without esophagitis 05/17/2016   Acquired hypothyroidism 09/22/2015   Hyperlipidemia 09/22/2015   Cough 03/27/2013   Abnormal chest CT 03/27/2013    Social History   Tobacco Use   Smoking status: Former    Current packs/day: 0.00    Types: Cigarettes    Quit date: 10/19/1971    Years since quitting: 52.1   Smokeless tobacco: Never  Substance Use Topics   Alcohol use: Not Currently    Allergies  Allergen Reactions   Cortisone Hives and Other (See Comments)    Burning/swelling.   Iodinated Contrast Media Other (See Comments)    Burning/swelling 11/15/22 - contrasted cardiac CTA w/ 13 hr prep , called back on 11/16/22 with itching, hives, swelling to face. Instructed patient to never have contrast ever again due to breakthru reaction   Prednisone     Tramadol Nausea And Vomiting and Other (See Comments)    dizziness    Current Meds  Medication Sig   apixaban  (ELIQUIS ) 5 MG TABS tablet Take 5 mg by mouth 2 (two) times daily. Pt is instructed to take when traveling by plane more than 5 hours   aspirin  EC 81 MG tablet Take 81 mg by mouth daily. Swallow whole.   atorvastatin  (LIPITOR) 40 MG tablet Take 40 mg by mouth daily.   buPROPion  (WELLBUTRIN  XL) 150 MG 24 hr tablet Take 150 mg by mouth daily.   celecoxib (CELEBREX) 200 MG capsule Take 200 mg by mouth daily.   cetirizine (ZYRTEC) 10  MG tablet Take 10 mg by mouth daily.   famotidine  (PEPCID ) 20 MG tablet Take 20 mg by mouth daily.   furosemide  (LASIX ) 20 MG tablet Take 20 mg by mouth as needed.   levothyroxine  (SYNTHROID ) 100 MCG tablet Take 100 mcg by mouth daily before breakfast.   LORazepam (ATIVAN) 0.5 MG tablet    meclizine  (ANTIVERT ) 25 MG tablet Take 25 mg by mouth 3 (three) times daily as needed (for vertigo).   Multiple Vitamins-Minerals (PX COMPLETE SENIOR MULTIVITS) TABS Take by mouth.   pantoprazole  (PROTONIX ) 40 MG tablet Take 40 mg by mouth 2 (two) times daily.    potassium chloride  (KLOR-CON ) 10 MEQ tablet Take by mouth. Take 1 tablet (10 mEq total) by mouth once daily   sucralfate (CARAFATE) 1 g tablet Take 1 g by mouth as needed.    SYMBICORT  80-4.5 MCG/ACT inhaler Inhale 2 puffs into the lungs 2 (two) times daily.   triamcinolone  cream (KENALOG ) 0.1 % Apply 1 application topically 2 (two) times daily as needed.   triamcinolone  cream (KENALOG ) 0.5 % Apply 1 application topically daily as needed (applied to dry/rough area of elbows.).   triamterene -hydrochlorothiazide  (MAXZIDE ) 75-50 MG per tablet Take 0.5 tablets by mouth daily.    venlafaxine   XR (EFFEXOR -XR) 150 MG 24 hr capsule Take 150 mg by mouth daily.    Immunization History  Administered Date(s) Administered   Fluad Quad(high Dose 65+) 06/18/2022, 08/04/2023   Influenza Split 10/23/2014   Influenza, High Dose Seasonal PF 06/24/2017, 07/07/2018   Influenza,inj,Quad PF,6+ Mos 08/07/2019   Influenza,inj,quad, With Preservative 07/17/2018   Influenza-Unspecified 10/23/2014, 10/25/2016, 06/24/2017, 07/08/2020, 08/04/2021   PFIZER Comirnaty(Gray Top)Covid-19 Tri-Sucrose Vaccine 11/03/2019, 11/27/2019, 09/16/2020, 03/17/2021   Pneumococcal Conjugate-13 04/14/2018   Pneumococcal Polysaccharide-23 04/09/2019   Tdap 04/06/2018   Unspecified SARS-COV-2 Vaccination 07/03/2021        Objective:     BP 118/80 (BP Location: Right Arm, Cuff Size:  Large)   Pulse (!) 57   Temp (!) 97.1 F (36.2 C)   Ht 5' 3 (1.6 m)   Wt 206 lb 3.2 oz (93.5 kg)   SpO2 96%   BMI 36.53 kg/m   SpO2: 96 % O2 Device: None (Room air)  GENERAL: Obese woman, no acute distress, no conversational dyspnea, fully ambulatory. HEAD: Normocephalic, atraumatic.  EYES: Pupils equal, round, reactive to light.  No scleral icterus.  MOUTH: Upper dentures, lower dentures, oral mucosa moist. NECK: Supple. No thyromegaly. Trachea midline. No JVD.  No adenopathy. PULMONARY: Good air entry bilaterally.  No adventitious sounds. CARDIOVASCULAR: S1 and S2. Regular rate and rhythm.  No rubs, murmurs or gallops heard.   ABDOMEN: Obese otherwise, benign. MUSCULOSKELETAL: No joint deformity, no clubbing, no edema.  NEUROLOGIC: No overt focal deficit, no gait disturbance, speech is fluent. SKIN: Intact,warm,dry. PSYCH: Mood and behavior normal.        Assessment & Plan:     ICD-10-CM   1. Chronic dyspnea  R06.09     2. OSA (obstructive sleep apnea)  G47.33 Ambulatory Referral for DME    3. 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 Obesity, Class II, BMI 35-39.9  E66.812     5. Pre-operative respiratory examination  Z01.811      Orders Placed This Encounter  Procedures   Ambulatory Referral for DME    Referral Priority:   Routine    Referral Type:   Durable Medical Equipment Purchase    Number of Visits Requested:   1      Discussion:    Preoperative Evaluation Mary Meyers presents for preoperative evaluation due to obstructive sleep apnea and chronic dyspnea related to deconditioning and obesity. She is concerned about her age complicating the surgery but emphasizes its necessity due to severe pain and bone-on-bone contact in her joints. - Patient's risk assessment for the proposed procedure is mild to moderate. - Ensure communication with primary care physician and cardiologist for comprehensive preoperative clearance - Ensure  mobility postoperatively ASAP to prevent atelectasis - Incentive spirometry postprocedure  Obstructive Sleep Apnea Chronic condition managed with CPAP. She reported difficulty using the CPAP machine while traveling due to insufficient power outlets and experiences discomfort with the current nasal interface, leading to skin irritation. She prefers a nasal-only interface. - Send order for a nasal CPAP interface from care  Chronic Dyspnea Chronic dyspnea primarily due to deconditioning and obesity. She acknowledges the need for weight loss and increased physical activity. - Encourage regular walking and weight loss - Increase mobility as soon as feasible from surgical standpoint  Reactive airways disease Reactive airways disease managed with Symbicort . Previous prescription for albuterol  was not received, likely due to administrative issues. - Send prescription for albuterol  inhaler  Follow-up - Follow up with primary  care physician and cardiologist for additional preoperative clearance.      Advised if symptoms do not improve or worsen, to please contact office for sooner follow up or seek emergency care.    I spent 32 minutes of dedicated to the care of this patient on the date of this encounter to include pre-visit review of records, face-to-face time with the patient discussing conditions above, post visit ordering of testing, clinical documentation with the electronic health record, making appropriate referrals as documented, and communicating necessary findings to members of the patients care team.     C. Leita Sanders, MD Advanced Bronchoscopy PCC[facility]    *This note was generated using voice recognition software/Dragon and/or AI transcription program.  Despite best efforts to proofread, errors can occur which can change the meaning. Any transcriptional errors that result from this process are unintentional and may not be fully corrected at the time of  dictation.

## 2023-11-22 NOTE — Patient Instructions (Addendum)
 VISIT SUMMARY:  Mary Meyers, a 78 year old female, visited for a preoperative evaluation due to chronic dyspnea, deconditioning, obesity, and obstructive sleep apnea.  Also has some mild airways reactivity.  She is experiencing significant pain due to bone-on-bone contact and is concerned about her age affecting surgical outcomes. She also reported issues with her CPAP machine and requires a new nasal interface. Additionally, she needs a prescription for an albuterol  inhaler for emergency use.  YOUR PLAN:  -PREOPERATIVE EVALUATION: A preoperative evaluation is an assessment before surgery to ensure the patient is fit for the procedure. You will receive preoperative risk assessment for your surgery, this is mild to moderate.  We will communicate the above to your operative team.  -OBSTRUCTIVE SLEEP APNEA: Obstructive sleep apnea is a condition where the airway becomes blocked during sleep, causing breathing pauses. You reported difficulty using your CPAP machine while traveling and discomfort with your current nasal interface. We will send an order for a new nasal CPAP interface from [facility] to improve your comfort.  I have ordered a DreamWear nasal mask.  -CHRONIC DYSPNEA: Chronic dyspnea is long-term shortness of breath, often due to deconditioning and obesity. We encourage you to engage in regular walking and work on weight loss to help improve your breathing.  -ASTHMA: Asthma is a condition where the airways become inflamed and narrow, making it hard to breathe. You are currently managing it with Symbicort  but need an additional rescue inhaler for emergencies. We will send a prescription for an albuterol  inhaler.  Your issues are mostly mild and mostly related to airways reactivity.  INSTRUCTIONS:  Please follow up with your primary care physician and cardiologist for preoperative clearance.  We will see on follow-up in 3 months time call sooner should any new problems arise.

## 2023-11-23 ENCOUNTER — Encounter: Payer: Self-pay | Admitting: Pulmonary Disease

## 2023-11-23 MED ORDER — ALBUTEROL SULFATE HFA 108 (90 BASE) MCG/ACT IN AERS: 2.0000 | INHALATION_SPRAY | Freq: Four times a day (QID) | RESPIRATORY_TRACT | 2 refills | Status: DC | PRN

## 2023-12-02 ENCOUNTER — Encounter: Payer: Self-pay | Admitting: Nurse Practitioner

## 2023-12-02 ENCOUNTER — Inpatient Hospital Stay: Payer: Medicare Other | Attending: Nurse Practitioner | Admitting: Nurse Practitioner

## 2023-12-02 VITALS — BP 157/78 | HR 71 | Temp 98.4°F | Wt 203.0 lb

## 2023-12-02 DIAGNOSIS — Z7951 Long term (current) use of inhaled steroids: Secondary | ICD-10-CM | POA: Diagnosis not present

## 2023-12-02 DIAGNOSIS — K5730 Diverticulosis of large intestine without perforation or abscess without bleeding: Secondary | ICD-10-CM | POA: Insufficient documentation

## 2023-12-02 DIAGNOSIS — E538 Deficiency of other specified B group vitamins: Secondary | ICD-10-CM | POA: Diagnosis not present

## 2023-12-02 DIAGNOSIS — I272 Pulmonary hypertension, unspecified: Secondary | ICD-10-CM | POA: Insufficient documentation

## 2023-12-02 DIAGNOSIS — Z79899 Other long term (current) drug therapy: Secondary | ICD-10-CM | POA: Insufficient documentation

## 2023-12-02 DIAGNOSIS — C541 Malignant neoplasm of endometrium: Secondary | ICD-10-CM | POA: Diagnosis present

## 2023-12-02 DIAGNOSIS — Z8542 Personal history of malignant neoplasm of other parts of uterus: Secondary | ICD-10-CM

## 2023-12-02 DIAGNOSIS — Z87891 Personal history of nicotine dependence: Secondary | ICD-10-CM | POA: Diagnosis not present

## 2023-12-02 DIAGNOSIS — Z923 Personal history of irradiation: Secondary | ICD-10-CM | POA: Diagnosis not present

## 2023-12-02 DIAGNOSIS — Z90710 Acquired absence of both cervix and uterus: Secondary | ICD-10-CM | POA: Insufficient documentation

## 2023-12-02 DIAGNOSIS — Z79890 Hormone replacement therapy (postmenopausal): Secondary | ICD-10-CM | POA: Diagnosis not present

## 2023-12-02 DIAGNOSIS — Z86711 Personal history of pulmonary embolism: Secondary | ICD-10-CM | POA: Insufficient documentation

## 2023-12-02 DIAGNOSIS — K219 Gastro-esophageal reflux disease without esophagitis: Secondary | ICD-10-CM | POA: Insufficient documentation

## 2023-12-02 DIAGNOSIS — E785 Hyperlipidemia, unspecified: Secondary | ICD-10-CM | POA: Insufficient documentation

## 2023-12-02 DIAGNOSIS — Z08 Encounter for follow-up examination after completed treatment for malignant neoplasm: Secondary | ICD-10-CM | POA: Diagnosis not present

## 2023-12-02 DIAGNOSIS — Z7901 Long term (current) use of anticoagulants: Secondary | ICD-10-CM | POA: Insufficient documentation

## 2023-12-02 DIAGNOSIS — I2510 Atherosclerotic heart disease of native coronary artery without angina pectoris: Secondary | ICD-10-CM | POA: Diagnosis not present

## 2023-12-02 DIAGNOSIS — Z803 Family history of malignant neoplasm of breast: Secondary | ICD-10-CM | POA: Diagnosis not present

## 2023-12-02 DIAGNOSIS — Z7982 Long term (current) use of aspirin: Secondary | ICD-10-CM | POA: Insufficient documentation

## 2023-12-02 DIAGNOSIS — E039 Hypothyroidism, unspecified: Secondary | ICD-10-CM | POA: Diagnosis not present

## 2023-12-02 DIAGNOSIS — K449 Diaphragmatic hernia without obstruction or gangrene: Secondary | ICD-10-CM | POA: Insufficient documentation

## 2023-12-02 DIAGNOSIS — I10 Essential (primary) hypertension: Secondary | ICD-10-CM | POA: Diagnosis not present

## 2023-12-02 DIAGNOSIS — Z90722 Acquired absence of ovaries, bilateral: Secondary | ICD-10-CM | POA: Insufficient documentation

## 2023-12-02 DIAGNOSIS — K635 Polyp of colon: Secondary | ICD-10-CM | POA: Insufficient documentation

## 2023-12-02 DIAGNOSIS — I700 Atherosclerosis of aorta: Secondary | ICD-10-CM | POA: Insufficient documentation
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
## 2023-12-08 ENCOUNTER — Emergency Department
Admission: EM | Admit: 2023-12-08 | Discharge: 2023-12-08 | Disposition: A | Discharge status: Home / Self Care | Payer: Medicare Other | Attending: Emergency Medicine | Admitting: Emergency Medicine

## 2023-12-08 ENCOUNTER — Encounter: Payer: Self-pay | Admitting: Emergency Medicine

## 2023-12-08 ENCOUNTER — Ambulatory Visit: Payer: Medicare Other | Admitting: Cardiology

## 2023-12-08 ENCOUNTER — Other Ambulatory Visit: Payer: Self-pay

## 2023-12-08 DIAGNOSIS — T7840XA Allergy, unspecified, initial encounter: Secondary | ICD-10-CM | POA: Insufficient documentation

## 2023-12-08 MED ORDER — EPINEPHRINE 0.3 MG/0.3ML IJ SOAJ: 0.3000 mg | INTRAMUSCULAR | 1 refills | Status: AC | PRN

## 2023-12-08 NOTE — Discharge Instructions (Addendum)
 As we discussed if you have any itching or hives please use 50 mg of Benadryl every 6 hours.  If you develop any swelling of your throat tongue trouble breathing, please administer your EpiPen and return to the emergency department immediately.

## 2023-12-08 NOTE — Progress Notes (Unsigned)
 Cardiology Office Note:  .   Date:  12/09/2023  ID:  Mary Meyers, DOB 1946/08/15, MRN [id number] PCP: Dorothey Baseman, MD  [facility] Providers Cardiologist:  Bryan Lemma, MD    History of Present Illness: .   Shaunie Boehm is a 78 y.o. female with past medical history of nonobstructive coronary artery disease by coronary CTA (10/2022), preserved prior PE following airplane travel no longer awaiting bedside as needed apixaban for travel, hypertension, hyperlipidemia, endometrial cancer, OSA, who is here today for follow-up of her nonobstructive coronary artery disease and a preoperative cardiovascular examination.   She was previously followed by Dr. Juliann Pares.  Treadmill MPI from 2018 showed no evidence of ischemia with an EF of 64%.  Echo from 2018 showed an EF greater than 55%, normal wall motion, grade 1 diastolic dysfunction, normal RV systolic function and ventricular cavity size, trivial aortic insufficiency and mild mitral regurgitation.  LHC from 10/2017 showed normal coronary arteries with an EF of 55%.  She subsequently established with [facility], for evaluation of dyspnea, with echo from 2019 showed normal LV systolic function with moderate LVH, normal RV systolic function and ventricular cavity size, mild aortic insufficiency and trivial mitral regurgitation.  Stress echo from 2020 was normal with no evidence of ischemia at maximal effort.  Resting echo showed normal LV systolic function, normal RV systolic function and ventricular cavity size with mild aortic insufficiency and trivial mitral regurgitation.  Prior outpatient cardiac monitoring from 2021 showed a predominant rhythm of sinus with an average rate of 67 bpm (range 45 to 162 bpm), 14 episodes of SVT with the longest episode lasting just 7 beats, and rare PACs/PVCs.  Patient triggered events corresponded to sinus rhythm, sinus tachycardia, and a short run of SVT.  PFTs suggestive of small airway  dysfunction as noted in [facility] note 07/17/2021.  Echocardiogram done 05/2022 performed by pulmonary for dyspnea, showed EF 60 to 65%, no RWMA, mild LVH, G1 DD, normal RV systolic function with moderately large ventricular cavity size, trivial aortic insufficiency, and estimated right atrial pressure of 50 mmHg.  She had also undergone PFTs and a sleep study through pulmonary with PFTs again showing small airway dysfunction.  Sleep study consistent with obstructive sleep apnea.  She established as a new patient with Dr. Herbie Baltimore in 10/2022 for evaluation of longstanding progressive shortness of breath and chest pain.  Coronary CTA on 11/15/2022 showed a calcium score of 73.4 which is the 53rd percentile.  There was 25% stenosis involving the proximal LAD.  Despite contrast allergy prophylaxis, the patient noted.  A crash 1 day following imaging.  She was without respiratory distress.  It was recommended she complete another cycle of prednisone along with diphenhydramine and famotidine.   She was last seen in clinic 01/07/2023 by Dr. Herbie Baltimore after having of right heart catheterization completed.  After procedure breathing and improved dramatically.  She lost weight with dietary adjustment and increased activity level exercise.  She is planning to start going to the gym.  She spends her 80 minutes on her stationary bike.  Getting her CPAP set back up again is able to get better sleep.  Blood pressure was being controlled on Maxide with potentially consider an ARB for afterload reduction of blood pressure continued to be slightly elevated.  There were no other medication changes made and no further testing that was ordered at the time.  She returns to clinic today accompanied by her oldest son.  She states that  overall she has been doing well without chest pain shortness of breath or peripheral edema.  She has some chronic dyspnea that is followed by pulmonary that is unchanged.  Unfortunately she was  evaluated in the emergency department due to an allergic reaction to a protein bar that required the use of epinephrine, Benadryl, famotidine.  She was discharged after 4-hour observation in the emergency department and prescribed an EpiPen.  She states that she has been compliant with her current medications.  She only has apixaban to take when she is on a flight for more than 5 hours per previously being prescribed.  She stated that the last dose of apixaban that she had had was at the end of January.  She does have upcoming surgery and [facility] for knee replacement and requires cardiovascular examination today for an upcoming procedure.  She states she will also have to be released from pulmonary as well.  ROS: 10 point review of systems has been reviewed and considered negative with exception was been listed in the HPI  Studies Reviewed: Marland Kitchen   EKG Interpretation Date/Time:  Friday December 09 2023 14:04:01 EST Ventricular Rate:  61 PR Interval:  158 QRS Duration:  92 QT Interval:  426 QTC Calculation: 428 R Axis:   -1  Text Interpretation: Normal sinus rhythm Nonspecific ST abnormality (noted since 2018) When compared with ECG of 16-Dec-2022 11:38, PREVIOUS ECG IS PRESENT Confirmed by Charlsie Quest ([id number]) on 12/09/2023 2:08:25 PM    RHC 12/16/2022   Hemodynamic findings consistent with mild pulmonary hypertension.   Borderline Mild Pulmonary Hypertension with mildly elevated PCWP in setting of systemic hypertension. Measured numbers do not correlate with estimated numbers on 2D echo.   Would likely benefit from treatment of hypertension, but probably would not explain patient's exertional dyspnea.  Would consider CPX if unclear etiology for dyspnea.   Coronary CTA 11/15/2022: FINDINGS: Aorta: Normal size. Aortic root, arch and descending aorta calcifications. No dissection.   Aortic Valve:  Trileaflet. minimal calcifications.   Coronary Arteries:  Normal coronary origin.  Right  dominance.   RCA is a dominant artery that gives rise to PDA and PLA. There is no plaque.   Left main gives rise to LAD and LCX arteries. LM has no disease.   LAD has calcified plaque proximally causing mild stenosis (25%).   LCX is a non-dominant artery.  There is no plaque.   Other findings:   Normal pulmonary vein drainage into the left atrium.   Normal left atrial appendage without a thrombus.   Normal size of the pulmonary artery.   IMPRESSION: 1. Coronary calcium score of 73.4. This was 53rd percentile for age and sex matched control. 2. Normal coronary origin with right dominance. 3. Mild proximal LAD stenosis (25%). 4. CAD-RADS 2. Mild non-obstructive CAD (25-49%). Consider non-atherosclerotic causes of chest pain. Consider preventive therapy and risk factor modification.   2D echo 05/25/2022: 1. Left ventricular ejection fraction, by estimation, is 60 to 65%. The  left ventricle has normal function. The left ventricle has no regional  wall motion abnormalities. There is mild left ventricular hypertrophy.  Left ventricular diastolic parameters  are consistent with Grade I diastolic dysfunction (impaired relaxation).   2. Right ventricular systolic function is normal. The right ventricular  size is moderately enlarged.   3. The mitral valve is normal in structure. No evidence of mitral valve  regurgitation.   4. The aortic valve was not well visualized. Aortic valve regurgitation  is trivial.  5. The inferior vena cava is dilated in size with <50% respiratory  variability, suggesting right atrial pressure of 15 mmHg.    Outpatient cardiac monitor 06/2020 ([facility]): 1. Study quality adequate for interpretation.  2. The predominant rhythm was sinus bradycardia to sinus tachycardia. 3. The Maximum Heart Rate recorded was 162 bpm, Day 3 / 08:01:51 am, the Minimum Heart Rate recorded was 45 bpm, Day 7 / 05:13:35 am and  the Average Heart Rate was 67 bpm. 4. There were 218  PVCs with a burden of 0.03 %. 5. There were 309 PSVCs with a burden of 0.05 %. There were 14 occurrences of Supraventricular Tachycardia with the longest episode 7 beats, Day 5 / 04:36:31 pm and the  fastest episode 162 bpm, Day 3 / 08:01:56 am. 6. There were 5 Patient triggered events (rapid or fast heartbeat or no symptoms)  of sinus rhythm, sinus tachycardia,  and a short run of SVT.    Stress echo 07/06/2019 ([facility]): INTERPRETATION    NORMAL STRESS TEST. NORMAL RESTING STUDY, NO CHANGE WITH STRESS.   VALVULAR REGURGITATION: MILD AR, TRIVIAL MR, MILD PR, TRIVIAL TR   NO VALVULAR STENOSIS   Note: NO ISCHEMIA  AT MAXIMAL EFFORT, 7 MINUTES STRESS, HEART RATE 121,   TARGET 125 BPM.   Maximum workload of  7.00 METs was achieved during exercise.   RESTING HYPERTENSION - EXAGGERATED RESPONSE   2D echo 05/26/2018 ([facility]): INTERPRETATION    NORMAL LEFT VENTRICULAR SYSTOLIC FUNCTION WITH MODERATE LVH    NORMAL RIGHT VENTRICULAR SYSTOLIC FUNCTION    VALVULAR REGURGITATION: MILD AR, TRIVIAL MR, MILD PR, TRIVIAL TR    NO VALVULAR STENOSIS    NO PRIOR STUDY FOR COMPARISON    LHC 11/02/2017: The left ventricular systolic function is normal. LV end diastolic pressure is normal. There is no mitral valve regurgitation. The left ventricular ejection fraction is 55-65% by visual estimate. Normal coronaries   Normal overall left ventricular function Normal Wall Motion Ejection fraction of greater than 55% Normal coronaries Patient is an acceptable surgical risk for high risk GYN gynecologic surgery   2D echo 09/12/2017 ([facility]): INTERPRETATION  NORMAL LEFT VENTRICULAR SYSTOLIC FUNCTION WITH AN ESTIMATED EF = >55 %  NORMAL RIGHT VENTRICULAR SYSTOLIC FUNCTION  MILD TRICUSPID AND MITRAL VALVE INSUFFICIENCY  TRACE AORTIC VALVE INSUFFICIENCY  NO VALVULAR STENOSIS    Treadmill MPI 09/07/2017 ([facility]): Normal myocardial perfusion scan no evidence of stress-induced  myocardial ischemia ejection fraction of  64% conclusion negative scan    Risk Assessment/Calculations:             Physical Exam:   VS:  BP 126/72   Pulse 61   Ht 5\' 3"  (1.6 m)   Wt 204 lb (92.5 kg)   SpO2 92%   BMI 36.14 kg/m    Wt Readings from Last 3 Encounters:  12/09/23 204 lb (92.5 kg)  12/08/23 203 lb (92.1 kg)  12/02/23 203 lb (92.1 kg)    GEN: Well nourished, well developed in no acute distress NECK: No JVD; No carotid bruits CARDIAC: RRR, no murmurs, rubs, gallops RESPIRATORY:  Clear to auscultation without rales, wheezing or rhonchi  ABDOMEN: Soft, non-tender, obese, non-distended EXTREMITIES: Trace pretibial edema; No deformity   ASSESSMENT AND PLAN: .   Pulmonary hypertension with mild pulmonary hypertension with a wedge pressure 17 mmHg.  Likely component of obesity related syndrome as well as mild diastolic dysfunction.  Not consistent with the level of chronic dyspnea that she has.  She is continued  on as needed furosemide.  Blood pressure continues to be well-controlled.  She continues to follow with pulmonary as well.  Essential hypertension with a blood pressure of 126/72.  Blood pressure has remained stable.  She is continued on triamterene HCTZ 75/50 mg daily as well as furosemide 20 mg as needed.  She has been encouraged to continue to monitor pressures 1 to 2 hours postmedication administration as well.  Hyperlipidemia where she is continued on atorvastatin 40 mg daily.  This continues to have ongoing management by her PCP.  Chronic dyspnea that continues to be followed by pulmonary.  She states it is unchanged over the last several months time.  She is continued on furosemide and inhalers.  Obstructive sleep apnea where she is compliant with CPAP.  Ongoing management per pulmonary  Obesity with a BMI 36.14 and would benefit from weight loss.  She states that her activity is limited due to severe knee pain  Preprocedure cardiovascular examination were according to ACC/AHA guidelines no further  cardiovascular testing needed the patient may proceed to surgery at acceptable risk.    Ms. Pharris perioperative risk of a major cardiac event is 0.4% according to the Revised Cardiac Risk Index (RCRI).  Therefore, she is at low risk for perioperative complications.   Her functional capacity is fair at 4.64 METs according to the [facility] Activity Status Index (DASI). Recommendations: According to ACC/AHA guidelines, no further cardiovascular testing needed.  The patient may proceed to surgery at acceptable risk.      Dispo: Patient return to clinic to see MD/APP in 6 months or sooner if needed  Signed, Quincie Haroon, NP

## 2023-12-08 NOTE — ED Provider Notes (Signed)
 [facility] Provider Note    Event Date/Time   First MD Initiated Contact with Patient 12/08/23 1352     (approximate)   History   Allergic Reaction (/)   HPI  Mary Meyers is a 78 y.o. female who presents to the emergency department today because of concerns for allergic reaction.  The patient does not have any known food allergies.  She did however try a new protein bar.  Shortly thereafter she started noticing swelling to her face and difficulty with breathing and change in her speech.  The patient did try taking some Benadryl at home.  When EMS arrived they gave epinephrine, more Benadryl and Pepcid.  Patient has allergies to steroids. At the time of my exam the patient states she is feeling improvement.      Physical Exam   Triage Vital Signs: ED Triage Vitals  Encounter Vitals Group     BP 12/08/23 1358 138/72     Systolic BP Percentile --      Diastolic BP Percentile --      Pulse Rate 12/08/23 1358 64     Resp 12/08/23 1358 20     Temp 12/08/23 1358 97.8 F (36.6 C)     Temp Source 12/08/23 1358 Oral     SpO2 12/08/23 1358 98 %     Weight 12/08/23 1359 203 lb (92.1 kg)     Height 12/08/23 1359 5\' 3"  (1.6 m)     Head Circumference --      Peak Flow --      Pain Score 12/08/23 1358 0     Pain Loc --      Pain Education --      Exclude from Growth Chart --     Most recent vital signs: Vitals:   12/08/23 1358  BP: 138/72  Pulse: 64  Resp: 20  Temp: 97.8 F (36.6 C)  SpO2: 98%   General: Awake, alert, oriented. CV:  Good peripheral perfusion. Regular rate and rhythm. Resp:  Normal effort. Lungs clear. Abd:  No distention.  Other:  Some swelling to the eye lids.   ED Results / Procedures / Treatments   Labs (all labs ordered are listed, but only abnormal results are displayed) Labs Reviewed - No data to display   EKG  None   RADIOLOGY None   PROCEDURES:  Critical Care performed: No    MEDICATIONS  ORDERED IN ED: Medications - No data to display   IMPRESSION / MDM / ASSESSMENT AND PLAN / ED COURSE  I reviewed the triage vital signs and the nursing notes.                              Differential diagnosis includes, but is not limited to, allergic reaction, dermatitis, anaphylaxis  Patient's presentation is most consistent with acute presentation with potential threat to life or bodily function.   The patient is on the cardiac monitor to evaluate for evidence of arrhythmia and/or significant heart rate changes.  Patient presented to the emergency department today after allergic reaction.  Patient received epinephrine, Benadryl and Pepcid prior to arrival.  My exam patient without any wheezing or respiratory distress.  Patient states she feels improved.  Patient is allergic to steroids.  At this time will continue to monitor.  Did advise patient to alert staff if her symptoms were to return or worsen.  Did discuss 4-hour observation.  FINAL CLINICAL IMPRESSION(S) / ED DIAGNOSES   Final diagnoses:  Allergic reaction, initial encounter     Note:  This document was prepared using Dragon voice recognition software and may include unintentional dictation errors.    Phineas Semen, MD 12/08/23 [phone]

## 2023-12-08 NOTE — ED Notes (Signed)
 Presents with some swelling note around eyes and scratchy throat  resp even and non labored  States she is feeling better

## 2023-12-08 NOTE — ED Provider Notes (Signed)
-----------------------------------------   5:36 PM on 12/08/2023 ----------------------------------------- Patient care assumed from Dr. Derrill Kay.  Patient's workup is reassuring.  She is feeling much better.  Denies any symptoms.  We will discharge with EpiPen's.  Discussed using Benadryl 50 mg every 6 hours if needed for any symptoms.  Discussed using her EpiPen and returning to the emergency department if she has any trouble breathing swelling of her throat or mouth.  Patient agreeable to this plan.   Minna Antis, MD 12/08/23 1736

## 2023-12-08 NOTE — ED Triage Notes (Signed)
 Presents via EMS with possible allergic rxn from home  Benadryl 50 mg  Pepcid 20 0.3 epi  S.l 18 g in left wrist

## 2023-12-09 ENCOUNTER — Encounter: Payer: Self-pay | Admitting: Cardiology

## 2023-12-09 ENCOUNTER — Ambulatory Visit: Payer: Medicare Other | Attending: Cardiology | Admitting: Cardiology

## 2023-12-09 VITALS — BP 126/72 | HR 61 | Ht 63.0 in | Wt 204.0 lb

## 2023-12-09 DIAGNOSIS — I272 Pulmonary hypertension, unspecified: Secondary | ICD-10-CM

## 2023-12-09 DIAGNOSIS — Z6836 Body mass index (BMI) 36.0-36.9, adult: Secondary | ICD-10-CM | POA: Diagnosis present

## 2023-12-09 DIAGNOSIS — E782 Mixed hyperlipidemia: Secondary | ICD-10-CM

## 2023-12-09 DIAGNOSIS — Z01810 Encounter for preprocedural cardiovascular examination: Secondary | ICD-10-CM | POA: Diagnosis present

## 2023-12-09 DIAGNOSIS — I10 Essential (primary) hypertension: Secondary | ICD-10-CM

## 2023-12-09 DIAGNOSIS — R0609 Other forms of dyspnea: Secondary | ICD-10-CM | POA: Diagnosis present

## 2023-12-09 DIAGNOSIS — G4733 Obstructive sleep apnea (adult) (pediatric): Secondary | ICD-10-CM

## 2023-12-09 NOTE — Patient Instructions (Signed)
 Medication Instructions:  No changes *If you need a refill on your cardiac medications before your next appointment, please call your pharmacy*   Lab Work: None ordered If you have labs (blood work) drawn today and your tests are completely normal, you will receive your results only by: MyChart Message (if you have MyChart) OR A paper copy in the mail If you have any lab test that is abnormal or we need to change your treatment, we will call you to review the results.   Testing/Procedures: None ordered   Follow-Up: At [facility], you and your health needs are our priority.  As part of our continuing mission to provide you with exceptional heart care, we have created designated Provider Care Teams.  These Care Teams include your primary Cardiologist (physician) and Advanced Practice Providers (APPs -  Physician Assistants and Nurse Practitioners) who all work together to provide you with the care you need, when you need it.  We recommend signing up for the patient portal called "MyChart".  Sign up information is provided on this After Visit Summary.  MyChart is used to connect with patients for Virtual Visits (Telemedicine).  Patients are able to view lab/test results, encounter notes, upcoming appointments, etc.  Non-urgent messages can be sent to your provider as well.   To learn more about what you can do with MyChart, go to [email or web address].    Your next appointment:   6 month(s)  Provider:   Dr. Herbie Baltimore

## 2023-12-15 ENCOUNTER — Telehealth: Payer: Self-pay | Admitting: Cardiology

## 2023-12-15 NOTE — Telephone Encounter (Signed)
 Mary Meyers from [facility] called again and mentioned that they need recommendations on the anticoagulant drugs that the patient is taking. Patient currently is taking aspirin EC 81 MG tablet and apixaban (ELIQUIS) 5 MG TABS tablet

## 2023-12-15 NOTE — Telephone Encounter (Signed)
   Patient Name: Mary Meyers  DOB: 02-02-1946 MRN: [id number]  Primary Cardiologist: Bryan Lemma, MD  Chart reviewed as part of pre-operative protocol coverage. Given past medical history and time since last visit, based on ACC/AHA guidelines, Mary Meyers is at acceptable risk for the planned procedure without further cardiovascular testing.   Pt was cleared for surgery at most recent office visit on 12/09/2023 by Frutoso Schatz, NP. Per Frutoso Schatz, "Per the patient she is not taking Eliquis. Someone gave that to her to take as needed for travel when she is in a plane 5 or more hours. The last time she took it was in Jan. As far as the ASA she can hold it as she only has nonobstructive disease and restart after surgery when OK with the surgeon."   I will route this recommendation to the requesting party via Epic fax function and remove from pre-op pool.  Please call with questions.  Joylene Grapes, NP 12/15/2023, 1:17 PM

## 2023-12-15 NOTE — Telephone Encounter (Signed)
 It is not clear in this message what kind of recommendations Marchelle Folks from [facility] is needing. If this is for a procedure, will need clearance from cardiologist and pharmacist not from the anticoagulation clinic.   Called spoke with [facility] sent a medical clearance for Right total knee replacement. Never got form back.  She states she got the OV note from 12/09/23 clearing pt for surgery, but also on the clearance form was medications to be held prior to surgery namely Eliquis and ASA which were not addressed in this OV/clearance note. Looks like there is a clearance from 10/26/23 where Carmela Hurt, pharmacist addressed Eliquis, but office never received recommendations for hold on ASA or Eliquis.  Please contact office to advise.

## 2023-12-15 NOTE — Telephone Encounter (Signed)
 Per the patient she is not taking Eliquis. Someone gave that to her to take as needed for travel when she is in a plane 5 or more hours. The last time she took it was in Jan. As far as the ASA she can hold it as she only has nonobstructive disease and restart after surgery when OK with the surgeon.

## 2023-12-15 NOTE — Telephone Encounter (Signed)
 Please disregard

## 2023-12-30 ENCOUNTER — Telehealth: Payer: Self-pay | Admitting: Cardiology

## 2023-12-30 NOTE — Telephone Encounter (Signed)
   Pre-operative Risk Assessment    Patient Name: Mary Meyers  DOB: Sep 29, 1946 MRN: [id number]   Date of last office visit: 12/09/23 Date of next office visit: n/a   Request for Surgical Clearance    Procedure:  Right Total Knee Arthroplasty  Date of Surgery:  Clearance TBD                                Surgeon:  Iran Planas, MD Surgeon's Group or Practice Name:  [facility] Phone number:  [phone] Fax number:  [phone]   Type of Clearance Requested:   - Medical    Type of Anesthesia:  Spinal   Additional requests/questions:    Signed, Narda Amber   12/30/2023, 2:03 PM

## 2023-12-30 NOTE — Telephone Encounter (Signed)
   Patient Name: Mary Meyers  DOB: [birth year] MRN: [id number]  Primary Cardiologist: Bryan Lemma, MD  Chart reviewed as part of pre-operative protocol coverage. Given past medical history and time since last visit, based on ACC/AHA guidelines, Delane Stalling is at acceptable risk for the planned procedure without further cardiovascular testing.   Pt was cleared for surgery at most recent office visit on 12/09/2023 by Frutoso Schatz, NP. Per Frutoso Schatz, "Per the patient she is not taking Eliquis. Someone gave that to her to take as needed for travel when she is in a plane 5 or more hours. The last time she took it was in Jan. As far as the ASA she can hold it as she only has nonobstructive disease and restart after surgery when OK with the surgeon."   I will route this recommendation to the requesting party via Epic fax function and remove from pre-op pool.  Please call with questions.  Joylene Grapes, NP 12/30/2023, 2:09 PM

## 2024-01-12 ENCOUNTER — Telehealth: Payer: Self-pay

## 2024-01-12 NOTE — Telephone Encounter (Signed)
 I took care of that and the form plus the copy of the note was sent in.

## 2024-01-12 NOTE — Telephone Encounter (Signed)
 Copied from CRM [id number]. Topic: Clinical - Request for Lab/Test Order >> Jan 12, 2024  1:52 PM Konrad Dolores wrote: Reason for CRM: Dainelle is requesting a clearance from Dr. Jayme Cloud to be sent to [facility] so she can have her operation. Lekeya stated Dr. Jayme Cloud had already completed this, however it was with a different provider conducting her operation and had to cancel. Phone number [phone].  Was seen 11/2023 for pre op clearance.

## 2024-01-25 ENCOUNTER — Other Ambulatory Visit: Payer: Self-pay | Admitting: Orthopedic Surgery

## 2024-01-30 NOTE — Patient Instructions (Signed)
 SURGICAL WAITING ROOM VISITATION  Patients having surgery or a procedure may have no more than 2 support people in the waiting area - these visitors may rotate.    Children under the age of 17 must have an adult with them who is not the patient.  Due to an increase in RSV and influenza rates and associated hospitalizations, children ages 54 and under may not visit patients in [facility] hospitals.  Visitors with respiratory illnesses are discouraged from visiting and should remain at home.  If the patient needs to stay at the hospital during part of their recovery, the visitor guidelines for inpatient rooms apply. Pre-op nurse will coordinate an appropriate time for 1 support person to accompany patient in pre-op.  This support person may not rotate.    Please refer to the [facility] website for the visitor guidelines for Inpatients (after your surgery is over and you are in a regular room).    Your procedure is scheduled on: 02/13/24   Report to [facility] Main Entrance    Report to admitting at 9:20 AM   Call this number if you have problems the morning of surgery [phone]   Do not eat food :After Midnight.   After Midnight you may have the following liquids until 8:50 AM DAY OF SURGERY  Water Non-Citrus Juices (without pulp, NO RED-Apple, White grape, White cranberry) Black Coffee (NO MILK/CREAM OR CREAMERS, sugar ok)  Clear Tea (NO MILK/CREAM OR CREAMERS, sugar ok) regular and decaf                             Plain Jell-O (NO RED)                                           Fruit ices (not with fruit pulp, NO RED)                                     Popsicles (NO RED)                                                               Sports drinks like Gatorade (NO RED)     The day of surgery:  Drink ONE (1) Pre-Surgery Clear Ensure or G2 at 8:50 AM the morning of surgery. Drink in one sitting. Do not sip.  This drink was given to you during your hospital  pre-op  appointment visit. Nothing else to drink after completing the  Pre-Surgery Clear Ensure or G2.          If you have questions, please contact your surgeon's office.   FOLLOW BOWEL PREP AND ANY ADDITIONAL PRE OP INSTRUCTIONS YOU RECEIVED FROM YOUR SURGEON'S OFFICE!!!     Oral Hygiene is also important to reduce your risk of infection.                                    Remember - BRUSH YOUR TEETH THE MORNING OF SURGERY WITH  YOUR REGULAR TOOTHPASTE  DENTURES WILL BE REMOVED PRIOR TO SURGERY PLEASE DO NOT APPLY "Poly grip" OR ADHESIVES!!!   Do NOT smoke after Midnight   Stop all vitamins and herbal supplements 7 days before surgery.   Take these medicines the morning of surgery with A SIP OF WATER: Atorvastatin, Bupropion, Zyrtec, Pepcid, Levothyroxine, Pantoprazole, Inhalers, Venlafaxine   DO NOT TAKE ANY ORAL DIABETIC MEDICATIONS DAY OF YOUR SURGERY  Bring CPAP mask and tubing day of surgery.                              You may not have any metal on your body including hair pins, jewelry, and body piercing             Do not wear make-up, lotions, powders, perfumes, or deodorant  Do not wear nail polish including gel and S&S, artificial/acrylic nails, or any other type of covering on natural nails including finger and toenails. If you have artificial nails, gel coating, etc. that needs to be removed by a nail salon please have this removed prior to surgery or surgery may need to be canceled/ delayed if the surgeon/ anesthesia feels like they are unable to be safely monitored.   Do not shave  48 hours prior to surgery.    Do not bring valuables to the hospital. [place] IS NOT             RESPONSIBLE   FOR VALUABLES.   Contacts, glasses, dentures or bridgework may not be worn into surgery.  DO NOT BRING YOUR HOME MEDICATIONS TO THE HOSPITAL. PHARMACY WILL DISPENSE MEDICATIONS LISTED ON YOUR MEDICATION LIST TO YOU DURING YOUR ADMISSION IN THE HOSPITAL!    Patients discharged  on the day of surgery will not be allowed to drive home.  Someone NEEDS to stay with you for the first 24 hours after anesthesia.   Special Instructions: Bring a copy of your healthcare power of attorney and living will documents the day of surgery if you haven't scanned them before.              Please read over the following fact sheets you were given: IF YOU HAVE QUESTIONS ABOUT YOUR PRE-OP INSTRUCTIONS PLEASE CALL [phone] [place]    If you received a COVID test during your pre-op visit  it is requested that you wear a mask when out in public, stay away from anyone that may not be feeling well and notify your surgeon if you develop symptoms. If you test positive for Covid or have been in contact with anyone that has tested positive in the last 10 days please notify you surgeon.      Pre-operative 5 CHG Bath Instructions   You can play a key role in reducing the risk of infection after surgery. Your skin needs to be as free of germs as possible. You can reduce the number of germs on your skin by washing with CHG (chlorhexidine gluconate) soap before surgery. CHG is an antiseptic soap that kills germs and continues to kill germs even after washing.   DO NOT use if you have an allergy to chlorhexidine/CHG or antibacterial soaps. If your skin becomes reddened or irritated, stop using the CHG and notify one of our RNs at [phone].   Please shower with the CHG soap starting 4 days before surgery using the following schedule:     Please keep in mind the following:  DO NOT  shave, including legs and underarms, starting the day of your first shower.   You may shave your face at any point before/day of surgery.  Place clean sheets on your bed the day you start using CHG soap. Use a clean washcloth (not used since being washed) for each shower. DO NOT sleep with pets once you start using the CHG.   CHG Shower Instructions:  If you choose to wash your hair and private area, wash first  with your normal shampoo/soap.  After you use shampoo/soap, rinse your hair and body thoroughly to remove shampoo/soap residue.  Turn the water OFF and apply about 3 tablespoons (45 ml) of CHG soap to a CLEAN washcloth.  Apply CHG soap ONLY FROM YOUR NECK DOWN TO YOUR TOES (washing for 3-5 minutes)  DO NOT use CHG soap on face, private areas, open wounds, or sores.  Pay special attention to the area where your surgery is being performed.  If you are having back surgery, having someone wash your back for you may be helpful. Wait 2 minutes after CHG soap is applied, then you may rinse off the CHG soap.  Pat dry with a clean towel  Put on clean clothes/pajamas   If you choose to wear lotion, please use ONLY the CHG-compatible lotions on the back of this paper.     Additional instructions for the day of surgery: DO NOT APPLY any lotions, deodorants, cologne, or perfumes.   Put on clean/comfortable clothes.  Brush your teeth.  Ask your nurse before applying any prescription medications to the skin.      CHG Compatible Lotions   Aveeno Moisturizing lotion  Cetaphil Moisturizing Cream  Cetaphil Moisturizing Lotion  Clairol Herbal Essence Moisturizing Lotion, Dry Skin  Clairol Herbal Essence Moisturizing Lotion, Extra Dry Skin  Clairol Herbal Essence Moisturizing Lotion, Normal Skin  Curel Age Defying Therapeutic Moisturizing Lotion with Alpha Hydroxy  Curel Extreme Care Body Lotion  Curel Soothing Hands Moisturizing Hand Lotion  Curel Therapeutic Moisturizing Cream, Fragrance-Free  Curel Therapeutic Moisturizing Lotion, Fragrance-Free  Curel Therapeutic Moisturizing Lotion, Original Formula  Eucerin Daily Replenishing Lotion  Eucerin Dry Skin Therapy Plus Alpha Hydroxy Crme  Eucerin Dry Skin Therapy Plus Alpha Hydroxy Lotion  Eucerin Original Crme  Eucerin Original Lotion  Eucerin Plus Crme Eucerin Plus Lotion  Eucerin TriLipid Replenishing Lotion  Keri Anti-Bacterial Hand  Lotion  Keri Deep Conditioning Original Lotion Dry Skin Formula Softly Scented  Keri Deep Conditioning Original Lotion, Fragrance Free Sensitive Skin Formula  Keri Lotion Fast Absorbing Fragrance Free Sensitive Skin Formula  Keri Lotion Fast Absorbing Softly Scented Dry Skin Formula  Keri Original Lotion  Keri Skin Renewal Lotion Keri Silky Smooth Lotion  Keri Silky Smooth Sensitive Skin Lotion  Nivea Body Creamy Conditioning Oil  Nivea Body Extra Enriched Lotion  Nivea Body Original Lotion  Nivea Body Sheer Moisturizing Lotion Nivea Crme  Nivea Skin Firming Lotion  NutraDerm 30 Skin Lotion  NutraDerm Skin Lotion  NutraDerm Therapeutic Skin Cream  NutraDerm Therapeutic Skin Lotion  ProShield Protective Hand Cream  Provon moisturizing lotion   Incentive Spirometer  An incentive spirometer is a tool that can help keep your lungs clear and active. This tool measures how well you are filling your lungs with each breath. Taking long deep breaths may help reverse or decrease the chance of developing breathing (pulmonary) problems (especially infection) following: A long period of time when you are unable to move or be active. BEFORE THE PROCEDURE  If the spirometer includes  an indicator to show your best effort, your nurse or respiratory therapist will set it to a desired goal. If possible, sit up straight or lean slightly forward. Try not to slouch. Hold the incentive spirometer in an upright position. INSTRUCTIONS FOR USE  Sit on the edge of your bed if possible, or sit up as far as you can in bed or on a chair. Hold the incentive spirometer in an upright position. Breathe out normally. Place the mouthpiece in your mouth and seal your lips tightly around it. Breathe in slowly and as deeply as possible, raising the piston or the ball toward the top of the column. Hold your breath for 3-5 seconds or for as long as possible. Allow the piston or ball to fall to the bottom of the  column. Remove the mouthpiece from your mouth and breathe out normally. Rest for a few seconds and repeat Steps 1 through 7 at least 10 times every 1-2 hours when you are awake. Take your time and take a few normal breaths between deep breaths. The spirometer may include an indicator to show your best effort. Use the indicator as a goal to work toward during each repetition. After each set of 10 deep breaths, practice coughing to be sure your lungs are clear. If you have an incision (the cut made at the time of surgery), support your incision when coughing by placing a pillow or rolled up towels firmly against it. Once you are able to get out of bed, walk around indoors and cough well. You may stop using the incentive spirometer when instructed by your caregiver.  RISKS AND COMPLICATIONS Take your time so you do not get dizzy or light-headed. If you are in pain, you may need to take or ask for pain medication before doing incentive spirometry. It is harder to take a deep breath if you are having pain. AFTER USE Rest and breathe slowly and easily. It can be helpful to keep track of a log of your progress. Your caregiver can provide you with a simple table to help with this. If you are using the spirometer at home, follow these instructions: SEEK MEDICAL CARE IF:  You are having difficultly using the spirometer. You have trouble using the spirometer as often as instructed. Your pain medication is not giving enough relief while using the spirometer. You develop fever of 100.5 F (38.1 C) or higher. SEEK IMMEDIATE MEDICAL CARE IF:  You cough up bloody sputum that had not been present before. You develop fever of 102 F (38.9 C) or greater. You develop worsening pain at or near the incision site. MAKE SURE YOU:  Understand these instructions. Will watch your condition. Will get help right away if you are not doing well or get worse. Document Released: 02/14/2007 Document Revised: 12/27/2011  Document Reviewed: 04/17/2007 [facility] Patient Information 2014 [place], [place].   ________________________________________________________________________

## 2024-01-30 NOTE — Progress Notes (Signed)
 COVID Vaccine Completed: yes  Date of COVID positive in last 90 days:  PCP - Rory Collard, MD Cardiologist - Randene Bustard, MD Pulmonologist- Coralie Derrick, MD LOV 11/22/23  Cardiac clearance by Ronald Cockayne, NP 12/09/23 in Epic  Pulmonary clearance by Dr. Viva Grise 11/22/23 in Epic  Medical clearance by Dr. Vila Grayer 12/05/23 in Epic   Chest x-ray - 06/17/23 Epic EKG - 12/09/23 Epic Stress Test - 07/06/19 [facility] ECHO - 05/25/22 Epic Cardiac Cath - 12/16/22 Epic Pacemaker/ICD device last checked: Spinal Cord Stimulator:  Bowel Prep -   Sleep Study -  CPAP -   Fasting Blood Sugar -  Checks Blood Sugar _____ times a day  Last dose of GLP1 agonist-  N/A GLP1 instructions:  Hold 7 days before surgery    Last dose of SGLT-2 inhibitors-  N/A SGLT-2 instructions:  Hold 3 days before surgery    Blood Thinner Instructions: Eliquis, not taking, only when on planes for moret han 5 hours Aspirin Instructions: ASA 81 Last Dose:  Activity level:  Can go up a flight of stairs and perform activities of daily living without stopping and without symptoms of chest pain or shortness of breath.  Able to exercise without symptoms  Unable to go up a flight of stairs without symptoms of     Anesthesia review: non obstructive CAD, PE, HTN, OSA, chronic dyspnea  Patient denies shortness of breath, fever, cough and chest pain at PAT appointment  Patient verbalized understanding of instructions that were given to them at the PAT appointment. Patient was also instructed that they will need to review over the PAT instructions again at home before surgery.

## 2024-01-31 ENCOUNTER — Encounter (HOSPITAL_COMMUNITY)
Admission: RE | Admit: 2024-01-31 | Discharge: 2024-01-31 | Disposition: A | Discharge status: Home / Self Care | Source: Ambulatory Visit | Attending: Orthopedic Surgery | Admitting: Orthopedic Surgery

## 2024-01-31 ENCOUNTER — Encounter (HOSPITAL_COMMUNITY): Payer: Self-pay

## 2024-01-31 ENCOUNTER — Other Ambulatory Visit: Payer: Self-pay

## 2024-01-31 VITALS — BP 150/85 | HR 55 | Temp 98.5°F | Resp 16 | Ht 62.0 in | Wt 196.0 lb

## 2024-01-31 DIAGNOSIS — I10 Essential (primary) hypertension: Secondary | ICD-10-CM | POA: Insufficient documentation

## 2024-01-31 DIAGNOSIS — Z01818 Encounter for other preprocedural examination: Secondary | ICD-10-CM | POA: Insufficient documentation

## 2024-01-31 HISTORY — DX: Sleep apnea, unspecified: G47.30

## 2024-01-31 LAB — CBC
HCT: 41.9 % (ref 36.0–46.0)
Hemoglobin: 13.6 g/dL (ref 12.0–15.0)
MCH: 30.8 pg (ref 26.0–34.0)
MCHC: 32.5 g/dL (ref 30.0–36.0)
MCV: 95 fL (ref 80.0–100.0)
Platelets: 396 10*3/uL (ref 150–400)
RBC: 4.41 MIL/uL (ref 3.87–5.11)
RDW: 12.8 % (ref 11.5–15.5)
WBC: 6.2 10*3/uL (ref 4.0–10.5)
nRBC: 0 % (ref 0.0–0.2)

## 2024-01-31 LAB — BASIC METABOLIC PANEL WITH GFR
Anion gap: 13 (ref 5–15)
BUN: 27 mg/dL — ABNORMAL HIGH (ref 8–23)
CO2: 25 mmol/L (ref 22–32)
Calcium: 9.6 mg/dL (ref 8.9–10.3)
Chloride: 101 mmol/L (ref 98–111)
Creatinine, Ser: 1.09 mg/dL — ABNORMAL HIGH (ref 0.44–1.00)
GFR, Estimated: 52 mL/min — ABNORMAL LOW (ref >60)
Glucose, Bld: 106 mg/dL — ABNORMAL HIGH (ref 70–99)
Potassium: 3.8 mmol/L (ref 3.5–5.1)
Sodium: 139 mmol/L (ref 135–145)

## 2024-01-31 LAB — SURGICAL PCR SCREEN
MRSA, PCR: NEGATIVE
Staphylococcus aureus: POSITIVE — AB

## 2024-01-31 NOTE — Progress Notes (Signed)
[facility]+ results routed to Dr. Rowan 

## 2024-02-08 DIAGNOSIS — M1711 Unilateral primary osteoarthritis, right knee: Principal | ICD-10-CM | POA: Diagnosis present
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
## 2024-02-08 NOTE — Anesthesia Preprocedure Evaluation (Addendum)
 Anesthesia Evaluation  Patient identified by MRN, date of birth, ID band Patient awake    Reviewed: Allergy & Precautions, H&P , NPO status , Patient's Chart, lab work & pertinent test results  Airway Mallampati: II   Neck ROM: full    Dental   Pulmonary sleep apnea , former smoker   breath sounds clear to auscultation       Cardiovascular hypertension,  Rhythm:regular Rate:Normal     Neuro/Psych  PSYCHIATRIC DISORDERS Anxiety Depression       GI/Hepatic ,GERD  ,,  Endo/Other  Hypothyroidism  Class 3 obesity  Renal/GU      Musculoskeletal  (+) Arthritis ,    Abdominal   Peds  Hematology   Anesthesia Other Findings   Reproductive/Obstetrics                             Anesthesia Physical Anesthesia Plan  ASA: 2  Anesthesia Plan: MAC and Spinal   Post-op Pain Management: Regional block*   Induction: Intravenous  PONV Risk Score and Plan: 2 and Propofol  infusion and Treatment may vary due to age or medical condition  Airway Management Planned: Simple Face Mask  Additional Equipment:   Intra-op Plan:   Post-operative Plan:   Informed Consent: I have reviewed the patients History and Physical, chart, labs and discussed the procedure including the risks, benefits and alternatives for the proposed anesthesia with the patient or authorized representative who has indicated his/her understanding and acceptance.     Dental advisory given  Plan Discussed with: CRNA, Anesthesiologist and Surgeon  Anesthesia Plan Comments: (See PAT note from 4/15)        Anesthesia Quick Evaluation
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
## 2024-02-12 MED ORDER — TRANEXAMIC ACID 1000 MG/10ML IV SOLN
2000.0000 mg | INTRAVENOUS | Status: DC
  Filled 2024-02-12: qty 20

## 2024-02-13 ENCOUNTER — Other Ambulatory Visit: Payer: Self-pay

## 2024-02-13 ENCOUNTER — Encounter (HOSPITAL_COMMUNITY): Payer: Self-pay | Admitting: Orthopedic Surgery

## 2024-02-13 ENCOUNTER — Ambulatory Visit (HOSPITAL_COMMUNITY): Payer: Self-pay | Admitting: Medical

## 2024-02-13 ENCOUNTER — Encounter (HOSPITAL_COMMUNITY)
Admission: RE | Disposition: A | Discharge status: Home / Self Care | Payer: Self-pay | Source: Ambulatory Visit | Attending: Orthopedic Surgery

## 2024-02-13 ENCOUNTER — Ambulatory Visit (HOSPITAL_COMMUNITY): Admitting: Anesthesiology

## 2024-02-13 ENCOUNTER — Observation Stay (HOSPITAL_COMMUNITY)
Admission: RE | Admit: 2024-02-13 | Discharge: 2024-02-14 | Disposition: A | Discharge status: Home / Self Care | Source: Ambulatory Visit | Attending: Orthopedic Surgery | Admitting: Orthopedic Surgery

## 2024-02-13 DIAGNOSIS — M1711 Unilateral primary osteoarthritis, right knee: Secondary | ICD-10-CM | POA: Diagnosis present

## 2024-02-13 DIAGNOSIS — Z7901 Long term (current) use of anticoagulants: Secondary | ICD-10-CM | POA: Insufficient documentation

## 2024-02-13 DIAGNOSIS — Z8542 Personal history of malignant neoplasm of other parts of uterus: Secondary | ICD-10-CM | POA: Diagnosis not present

## 2024-02-13 DIAGNOSIS — Z96651 Presence of right artificial knee joint: Principal | ICD-10-CM

## 2024-02-13 DIAGNOSIS — I10 Essential (primary) hypertension: Secondary | ICD-10-CM | POA: Diagnosis not present

## 2024-02-13 DIAGNOSIS — E039 Hypothyroidism, unspecified: Secondary | ICD-10-CM | POA: Insufficient documentation

## 2024-02-13 DIAGNOSIS — Z87891 Personal history of nicotine dependence: Secondary | ICD-10-CM | POA: Diagnosis not present

## 2024-02-13 DIAGNOSIS — Z79899 Other long term (current) drug therapy: Secondary | ICD-10-CM | POA: Diagnosis not present

## 2024-02-13 HISTORY — DX: Prediabetes: R73.03

## 2024-02-13 HISTORY — PX: TOTAL KNEE ARTHROPLASTY: SHX125

## 2024-02-13 LAB — HEMOGLOBIN A1C
Hgb A1c MFr Bld: 5.6 % (ref 4.8–5.6)
Mean Plasma Glucose: 114.02 mg/dL

## 2024-02-13 LAB — GLUCOSE, CAPILLARY: Glucose-Capillary: 186 mg/dL — ABNORMAL HIGH (ref 70–99)

## 2024-02-13 SURGERY — ARTHROPLASTY, KNEE, TOTAL
Anesthesia: Monitor Anesthesia Care | Site: Knee | Laterality: Right

## 2024-02-13 MED ORDER — TRANEXAMIC ACID-NACL 1000-0.7 MG/100ML-% IV SOLN
1000.0000 mg | Freq: Once | INTRAVENOUS | Status: AC
  Administered 2024-02-13: 1000 mg via INTRAVENOUS

## 2024-02-13 MED ORDER — OXYCODONE HCL 5 MG PO TABS: 5.0000 mg | ORAL_TABLET | ORAL | Status: DC | PRN

## 2024-02-13 MED ORDER — ONDANSETRON HCL 4 MG/2ML IJ SOLN: 4.0000 mg | Freq: Four times a day (QID) | INTRAMUSCULAR | Status: DC | PRN

## 2024-02-13 MED ORDER — MIDAZOLAM HCL 2 MG/2ML IJ SOLN
1.0000 mg | INTRAMUSCULAR | Status: DC | PRN
  Filled 2024-02-13: qty 2

## 2024-02-13 MED ORDER — MOMETASONE FURO-FORMOTEROL FUM 100-5 MCG/ACT IN AERO
2.0000 | INHALATION_SPRAY | Freq: Two times a day (BID) | RESPIRATORY_TRACT | Status: DC
Start: 2024-02-13 — End: 2024-02-14
  Administered 2024-02-13 – 2024-02-14 (×2): 2 via RESPIRATORY_TRACT
  Filled 2024-02-13: qty 8.8

## 2024-02-13 MED ORDER — BISACODYL 5 MG PO TBEC: 5.0000 mg | DELAYED_RELEASE_TABLET | Freq: Every day | ORAL | Status: DC | PRN

## 2024-02-13 MED ORDER — ACETAMINOPHEN 325 MG PO TABS: 325.0000 mg | ORAL_TABLET | Freq: Four times a day (QID) | ORAL | Status: DC | PRN

## 2024-02-13 MED ORDER — SODIUM CHLORIDE (PF) 0.9 % IJ SOLN
INTRAMUSCULAR | Status: AC
  Filled 2024-02-13: qty 50

## 2024-02-13 MED ORDER — ALUM & MAG HYDROXIDE-SIMETH 200-200-20 MG/5ML PO SUSP: 30.0000 mL | ORAL | Status: DC | PRN

## 2024-02-13 MED ORDER — MECLIZINE HCL 25 MG PO TABS: 50.0000 mg | ORAL_TABLET | Freq: Three times a day (TID) | ORAL | Status: DC | PRN

## 2024-02-13 MED ORDER — BUPIVACAINE-EPINEPHRINE (PF) 0.25% -1:200000 IJ SOLN
INTRAMUSCULAR | Status: AC
  Filled 2024-02-13: qty 30

## 2024-02-13 MED ORDER — FAMOTIDINE 20 MG PO TABS: 20.0000 mg | ORAL_TABLET | Freq: Every day | ORAL | Status: DC | PRN

## 2024-02-13 MED ORDER — DEXAMETHASONE SODIUM PHOSPHATE 10 MG/ML IJ SOLN
INTRAMUSCULAR | Status: DC | PRN
  Administered 2024-02-13: 10 mg via INTRAVENOUS

## 2024-02-13 MED ORDER — PHENYLEPHRINE 80 MCG/ML (10ML) SYRINGE FOR IV PUSH (FOR BLOOD PRESSURE SUPPORT)
PREFILLED_SYRINGE | INTRAVENOUS | Status: DC | PRN
  Administered 2024-02-13 (×3): 160 ug via INTRAVENOUS

## 2024-02-13 MED ORDER — BUPIVACAINE IN DEXTROSE 0.75-8.25 % IT SOLN
INTRATHECAL | Status: DC | PRN
  Administered 2024-02-13: 1.6 mL via INTRATHECAL

## 2024-02-13 MED ORDER — TRANEXAMIC ACID-NACL 1000-0.7 MG/100ML-% IV SOLN
1000.0000 mg | INTRAVENOUS | Status: AC
  Administered 2024-02-13: 1000 mg via INTRAVENOUS
  Filled 2024-02-13: qty 100

## 2024-02-13 MED ORDER — APIXABAN 2.5 MG PO TABS
2.5000 mg | ORAL_TABLET | Freq: Two times a day (BID) | ORAL | Status: DC
  Administered 2024-02-14: 2.5 mg via ORAL
  Filled 2024-02-13: qty 1

## 2024-02-13 MED ORDER — MENTHOL 3 MG MT LOZG: 1.0000 | LOZENGE | OROMUCOSAL | Status: DC | PRN

## 2024-02-13 MED ORDER — VENLAFAXINE HCL ER 150 MG PO CP24
150.0000 mg | ORAL_CAPSULE | Freq: Every day | ORAL | Status: DC
  Administered 2024-02-14: 150 mg via ORAL
  Filled 2024-02-13: qty 1

## 2024-02-13 MED ORDER — CHLORHEXIDINE GLUCONATE 4 % EX SOLN
CUTANEOUS | 1 refills | Status: AC
Start: 2024-02-13 — End: ?

## 2024-02-13 MED ORDER — PROPOFOL 500 MG/50ML IV EMUL
INTRAVENOUS | Status: DC | PRN
  Administered 2024-02-13: 85 ug/kg/min via INTRAVENOUS

## 2024-02-13 MED ORDER — FENTANYL CITRATE PF 50 MCG/ML IJ SOSY: 25.0000 ug | PREFILLED_SYRINGE | INTRAMUSCULAR | Status: DC | PRN

## 2024-02-13 MED ORDER — ROPIVACAINE HCL 5 MG/ML IJ SOLN
INTRAMUSCULAR | Status: DC | PRN
  Administered 2024-02-13: 25 mL via PERINEURAL

## 2024-02-13 MED ORDER — HYDROMORPHONE HCL 1 MG/ML IJ SOLN: 0.5000 mg | INTRAMUSCULAR | Status: DC | PRN

## 2024-02-13 MED ORDER — MUPIROCIN 2 % EX OINT: 1.0000 | TOPICAL_OINTMENT | Freq: Two times a day (BID) | CUTANEOUS | 0 refills | Status: AC

## 2024-02-13 MED ORDER — OXYCODONE HCL 5 MG PO TABS
5.0000 mg | ORAL_TABLET | Freq: Once | ORAL | Status: AC | PRN
  Administered 2024-02-13: 5 mg via ORAL

## 2024-02-13 MED ORDER — DOCUSATE SODIUM 100 MG PO CAPS
100.0000 mg | ORAL_CAPSULE | Freq: Two times a day (BID) | ORAL | Status: DC
  Administered 2024-02-13 – 2024-02-14 (×2): 100 mg via ORAL
  Filled 2024-02-13 (×2): qty 1

## 2024-02-13 MED ORDER — TRANEXAMIC ACID-NACL 1000-0.7 MG/100ML-% IV SOLN
INTRAVENOUS | Status: AC
  Filled 2024-02-13: qty 100

## 2024-02-13 MED ORDER — ONDANSETRON HCL 4 MG/2ML IJ SOLN
INTRAMUSCULAR | Status: AC
Start: 2024-02-13 — End: ?
  Filled 2024-02-13: qty 2

## 2024-02-13 MED ORDER — DIPHENHYDRAMINE HCL 12.5 MG/5ML PO ELIX
12.5000 mg | ORAL_SOLUTION | ORAL | Status: DC | PRN
  Administered 2024-02-14 (×2): 25 mg via ORAL
  Filled 2024-02-13 (×2): qty 10

## 2024-02-13 MED ORDER — METOCLOPRAMIDE HCL 5 MG/ML IJ SOLN
5.0000 mg | Freq: Three times a day (TID) | INTRAMUSCULAR | Status: DC | PRN
Start: 2024-02-13 — End: 2024-02-14

## 2024-02-13 MED ORDER — BUPIVACAINE LIPOSOME 1.3 % IJ SUSP
INTRAMUSCULAR | Status: AC
  Filled 2024-02-13: qty 20

## 2024-02-13 MED ORDER — SODIUM CHLORIDE (PF) 0.9 % IJ SOLN
INTRAMUSCULAR | Status: DC | PRN
  Administered 2024-02-13: 120 mL

## 2024-02-13 MED ORDER — FENTANYL CITRATE PF 50 MCG/ML IJ SOSY
50.0000 ug | PREFILLED_SYRINGE | INTRAMUSCULAR | Status: DC | PRN
  Administered 2024-02-13: 50 ug via INTRAVENOUS
  Filled 2024-02-13: qty 2

## 2024-02-13 MED ORDER — PANTOPRAZOLE SODIUM 40 MG PO TBEC
40.0000 mg | DELAYED_RELEASE_TABLET | Freq: Every day | ORAL | Status: DC
  Administered 2024-02-14: 40 mg via ORAL
  Filled 2024-02-13: qty 1

## 2024-02-13 MED ORDER — ORAL CARE MOUTH RINSE: 15.0000 mL | Freq: Once | OROMUCOSAL | Status: AC

## 2024-02-13 MED ORDER — DEXAMETHASONE SODIUM PHOSPHATE 10 MG/ML IJ SOLN
INTRAMUSCULAR | Status: AC
  Filled 2024-02-13: qty 1

## 2024-02-13 MED ORDER — ACETAMINOPHEN 500 MG PO TABS
1000.0000 mg | ORAL_TABLET | Freq: Four times a day (QID) | ORAL | Status: AC
  Administered 2024-02-13 – 2024-02-14 (×3): 1000 mg via ORAL
  Filled 2024-02-13 (×2): qty 2

## 2024-02-13 MED ORDER — METHOCARBAMOL 500 MG PO TABS
500.0000 mg | ORAL_TABLET | Freq: Four times a day (QID) | ORAL | Status: DC | PRN
  Administered 2024-02-13 – 2024-02-14 (×2): 500 mg via ORAL
  Filled 2024-02-13: qty 1

## 2024-02-13 MED ORDER — PROPOFOL 500 MG/50ML IV EMUL
INTRAVENOUS | Status: AC
  Filled 2024-02-13: qty 50

## 2024-02-13 MED ORDER — SODIUM CHLORIDE 0.9 % IR SOLN
Status: DC | PRN
  Administered 2024-02-13: 1000 mL

## 2024-02-13 MED ORDER — KCL IN DEXTROSE-NACL 20-5-0.45 MEQ/L-%-% IV SOLN
INTRAVENOUS | Status: DC
  Filled 2024-02-13 (×2): qty 1000

## 2024-02-13 MED ORDER — OXYCODONE HCL 5 MG PO TABS
10.0000 mg | ORAL_TABLET | ORAL | Status: DC | PRN
  Administered 2024-02-13 – 2024-02-14 (×2): 15 mg via ORAL
  Filled 2024-02-13 (×2): qty 3

## 2024-02-13 MED ORDER — METHOCARBAMOL 1000 MG/10ML IJ SOLN: 500.0000 mg | Freq: Four times a day (QID) | INTRAMUSCULAR | Status: DC | PRN

## 2024-02-13 MED ORDER — ACETAMINOPHEN 500 MG PO TABS
ORAL_TABLET | ORAL | Status: AC
  Filled 2024-02-13: qty 2

## 2024-02-13 MED ORDER — WATER FOR IRRIGATION, STERILE IR SOLN
Status: DC | PRN
  Administered 2024-02-13: 1000 mL

## 2024-02-13 MED ORDER — TRANEXAMIC ACID 1000 MG/10ML IV SOLN
INTRAVENOUS | Status: DC | PRN
  Administered 2024-02-13: 2000 mg via TOPICAL

## 2024-02-13 MED ORDER — CEFAZOLIN SODIUM-DEXTROSE 2-4 GM/100ML-% IV SOLN
2.0000 g | INTRAVENOUS | Status: AC
Start: 2024-02-13 — End: 2024-02-13
  Administered 2024-02-13: 2 g via INTRAVENOUS
  Filled 2024-02-13: qty 100

## 2024-02-13 MED ORDER — TIZANIDINE HCL 2 MG PO TABS: 2.0000 mg | ORAL_TABLET | Freq: Four times a day (QID) | ORAL | 0 refills | Status: DC | PRN

## 2024-02-13 MED ORDER — BUPIVACAINE LIPOSOME 1.3 % IJ SUSP
20.0000 mL | Freq: Once | INTRAMUSCULAR | Status: DC
Start: 2024-02-13 — End: 2024-02-13

## 2024-02-13 MED ORDER — FLEET ENEMA RE ENEM: 1.0000 | ENEMA | Freq: Once | RECTAL | Status: DC | PRN

## 2024-02-13 MED ORDER — PHENYLEPHRINE 80 MCG/ML (10ML) SYRINGE FOR IV PUSH (FOR BLOOD PRESSURE SUPPORT)
PREFILLED_SYRINGE | INTRAVENOUS | Status: AC
Start: 2024-02-13 — End: ?
  Filled 2024-02-13: qty 10

## 2024-02-13 MED ORDER — PROPOFOL 10 MG/ML IV BOLUS
INTRAVENOUS | Status: DC | PRN
  Administered 2024-02-13: 40 mg via INTRAVENOUS

## 2024-02-13 MED ORDER — BUPROPION HCL ER (XL) 150 MG PO TB24
150.0000 mg | ORAL_TABLET | Freq: Every day | ORAL | Status: DC
  Administered 2024-02-14: 150 mg via ORAL
  Filled 2024-02-13: qty 1

## 2024-02-13 MED ORDER — ONDANSETRON HCL 4 MG PO TABS: 4.0000 mg | ORAL_TABLET | Freq: Four times a day (QID) | ORAL | Status: DC | PRN

## 2024-02-13 MED ORDER — LACTATED RINGERS IV SOLN: INTRAVENOUS | Status: DC

## 2024-02-13 MED ORDER — OXYCODONE HCL 5 MG/5ML PO SOLN: 5.0000 mg | Freq: Once | ORAL | Status: AC | PRN

## 2024-02-13 MED ORDER — LACTATED RINGERS IV BOLUS
250.0000 mL | Freq: Once | INTRAVENOUS | Status: AC
  Administered 2024-02-14: 250 mL via INTRAVENOUS

## 2024-02-13 MED ORDER — ONDANSETRON HCL 4 MG/2ML IJ SOLN
INTRAMUSCULAR | Status: DC | PRN
Start: 2024-02-13 — End: 2024-02-13
  Administered 2024-02-13: 4 mg via INTRAVENOUS

## 2024-02-13 MED ORDER — 0.9 % SODIUM CHLORIDE (POUR BTL) OPTIME
TOPICAL | Status: DC | PRN
  Administered 2024-02-13: 1000 mL

## 2024-02-13 MED ORDER — POLYETHYLENE GLYCOL 3350 17 G PO PACK: 17.0000 g | PACK | Freq: Every day | ORAL | Status: DC | PRN

## 2024-02-13 MED ORDER — SODIUM CHLORIDE (PF) 0.9 % IJ SOLN
INTRAMUSCULAR | Status: AC
  Filled 2024-02-13: qty 20

## 2024-02-13 MED ORDER — ZOLPIDEM TARTRATE 5 MG PO TABS
5.0000 mg | ORAL_TABLET | Freq: Every evening | ORAL | Status: DC | PRN
  Filled 2024-02-13: qty 1

## 2024-02-13 MED ORDER — POVIDONE-IODINE 10 % EX SWAB
2.0000 | Freq: Once | CUTANEOUS | Status: AC
  Administered 2024-02-13: 2 via TOPICAL

## 2024-02-13 MED ORDER — LEVOTHYROXINE SODIUM 100 MCG PO TABS
100.0000 ug | ORAL_TABLET | Freq: Every day | ORAL | Status: DC
  Administered 2024-02-14: 100 ug via ORAL
  Filled 2024-02-13: qty 1

## 2024-02-13 MED ORDER — METOCLOPRAMIDE HCL 5 MG PO TABS
5.0000 mg | ORAL_TABLET | Freq: Three times a day (TID) | ORAL | Status: DC | PRN
Start: 2024-02-13 — End: 2024-02-14

## 2024-02-13 MED ORDER — LACTATED RINGERS IV BOLUS: 500.0000 mL | Freq: Once | INTRAVENOUS | Status: DC

## 2024-02-13 MED ORDER — METHOCARBAMOL 500 MG PO TABS
ORAL_TABLET | ORAL | Status: AC
  Filled 2024-02-13: qty 1

## 2024-02-13 MED ORDER — INSULIN ASPART 100 UNIT/ML IJ SOLN
0.0000 [IU] | Freq: Three times a day (TID) | INTRAMUSCULAR | Status: DC
  Administered 2024-02-14: 3 [IU] via SUBCUTANEOUS

## 2024-02-13 MED ORDER — APIXABAN 2.5 MG PO TABS: 2.5000 mg | ORAL_TABLET | Freq: Two times a day (BID) | ORAL | 0 refills | Status: DC

## 2024-02-13 MED ORDER — FUROSEMIDE 20 MG PO TABS: 20.0000 mg | ORAL_TABLET | ORAL | Status: DC | PRN

## 2024-02-13 MED ORDER — LORATADINE 10 MG PO TABS
10.0000 mg | ORAL_TABLET | Freq: Every day | ORAL | Status: DC
Start: 2024-02-14 — End: 2024-02-14
  Administered 2024-02-14: 10 mg via ORAL
  Filled 2024-02-13: qty 1

## 2024-02-13 MED ORDER — PHENOL 1.4 % MT LIQD: 1.0000 | OROMUCOSAL | Status: DC | PRN

## 2024-02-13 MED ORDER — CHLORHEXIDINE GLUCONATE 0.12 % MT SOLN
15.0000 mL | Freq: Once | OROMUCOSAL | Status: AC
  Administered 2024-02-13: 15 mL via OROMUCOSAL

## 2024-02-13 MED ORDER — OXYCODONE-ACETAMINOPHEN 5-325 MG PO TABS
1.0000 | ORAL_TABLET | ORAL | 0 refills | Status: DC | PRN
Start: 2024-02-13 — End: 2024-02-14

## 2024-02-13 MED ORDER — OXYCODONE HCL 5 MG PO TABS
ORAL_TABLET | ORAL | Status: AC
  Filled 2024-02-13: qty 1

## 2024-02-13 SURGICAL SUPPLY — 38 items
ATTUNE PS FEM RT SZ 5 CEM KNEE (Femur) IMPLANT
ATTUNE PSRP INSR SZ5 6 KNEE (Insert) IMPLANT
BAG COUNTER SPONGE SURGICOUNT (BAG) IMPLANT
BAG DECANTER FOR FLEXI CONT (MISCELLANEOUS) ×1 IMPLANT
BAG ZIPLOCK 12X15 (MISCELLANEOUS) ×1 IMPLANT
BASE TIBIA ATTUNE KNEE SYS SZ6 (Knees) IMPLANT
BLADE SAG 18X100X1.27 (BLADE) ×1 IMPLANT
BLADE SAW SGTL 11.0X1.19X90.0M (BLADE) ×1 IMPLANT
BNDG ELASTIC 6X10 VLCR STRL LF (GAUZE/BANDAGES/DRESSINGS) ×1 IMPLANT
BOWL SMART MIX CTS (DISPOSABLE) ×1 IMPLANT
CEMENT HV SMART SET (Cement) ×2 IMPLANT
COVER SURGICAL LIGHT HANDLE (MISCELLANEOUS) ×1 IMPLANT
DRAPE INCISE IOBAN 66X45 STRL (DRAPES) IMPLANT
DRAPE U-SHAPE 47X51 STRL (DRAPES) ×1 IMPLANT
DRSG AQUACEL AG ADV 3.5X10 (GAUZE/BANDAGES/DRESSINGS) ×1 IMPLANT
DURAPREP 26ML APPLICATOR (WOUND CARE) ×1 IMPLANT
ELECT REM PT RETURN 15FT ADLT (MISCELLANEOUS) ×1 IMPLANT
GLOVE BIO SURGEON STRL SZ7.5 (GLOVE) ×1 IMPLANT
GLOVE BIO SURGEON STRL SZ8.5 (GLOVE) ×1 IMPLANT
GLOVE BIOGEL PI IND STRL 8 (GLOVE) ×1 IMPLANT
GLOVE BIOGEL PI IND STRL 9 (GLOVE) ×1 IMPLANT
GOWN STRL REUS W/ TWL XL LVL3 (GOWN DISPOSABLE) ×2 IMPLANT
HOOD PEEL AWAY T7 (MISCELLANEOUS) ×3 IMPLANT
KIT TURNOVER KIT A (KITS) IMPLANT
NS IRRIG 1000ML POUR BTL (IV SOLUTION) ×1 IMPLANT
PACK TOTAL KNEE CUSTOM (KITS) ×1 IMPLANT
PATELLA MEDIAL ATTUN 35MM KNEE (Knees) IMPLANT
PIN STEINMAN FIXATION KNEE (PIN) IMPLANT
PROTECTOR NERVE ULNAR (MISCELLANEOUS) ×1 IMPLANT
SET HNDPC FAN SPRY TIP SCT (DISPOSABLE) ×1 IMPLANT
SUT VIC AB 0 CT1 36 (SUTURE) ×1 IMPLANT
SUT VIC AB 1 CTX36XBRD ANBCTR (SUTURE) ×1 IMPLANT
SUT VICRYL+ 3-0 36IN CT-1 (SUTURE) ×3 IMPLANT
SYR CONTROL 10ML LL (SYRINGE) ×2 IMPLANT
TRAY CATH INTERMITTENT SS 16FR (CATHETERS) ×1 IMPLANT
TUBE SUCTION HIGH CAP CLEAR NV (SUCTIONS) ×1 IMPLANT
WATER STERILE IRR 1000ML POUR (IV SOLUTION) ×2 IMPLANT
WRAP KNEE MAXI GEL POST OP (GAUZE/BANDAGES/DRESSINGS) ×1 IMPLANT

## 2024-02-13 NOTE — Op Note (Signed)
 PATIENT ID:      Mary Meyers  MRN:     [id number] DOB/AGE:    01-06-1946 / 78 y.o.       OPERATIVE REPORT   DATE OF PROCEDURE:  02/13/2024      PREOPERATIVE DIAGNOSIS:   RIGHT KNEE OSTEOARTHRITIS      Estimated body mass index is 35.85 kg/m as calculated from the following:   Height as of this encounter: 5\' 2"  (1.575 m).   Weight as of this encounter: 88.9 kg.                                                       POSTOPERATIVE DIAGNOSIS:   Same                                                                  PROCEDURE:  Procedure(s): ARTHROPLASTY, KNEE, TOTAL Using DepuyAttune RP implants #5R Femur, #6Tibia, 6 mm Attune RP bearing, 35 Patella    SURGEON: Ilean Mall  ASSISTANT:   Bunny Caroli. [employer]   (Present and scrubbed throughout the case, critical for assistance with exposure, retraction, instrumentation, and closure.)        ANESTHESIA: Spinal, 20cc Exparel, 50cc 0.25% Marcaine EBL: 300 cc FLUID REPLACEMENT: 1600 cc crystaloid TOURNIQUET: none DRAINS: None TRANEXAMIC ACID: 1gm IV, 2gm topical COMPLICATIONS:  None         INDICATIONS FOR PROCEDURE: The patient has  RIGHT KNEE OSTEOARTHRITIS, Var deformities, XR shows bone on bone arthritis, lateral subluxation of tibia. Patient has failed all conservative measures including anti-inflammatory medicines, narcotics, attempts at exercise and weight loss, cortisone injections and viscosupplementation.  Risks and benefits of surgery have been discussed, questions answered.   DESCRIPTION OF PROCEDURE: The patient identified by armband, received  IV antibiotics, in the holding area at [facility]. Patient taken to the operating room, appropriate anesthetic monitors were attached, and Spinal anesthesia was  induced. IV Tranexamic acid was given. Lateral post and 2 surefoot positioners applied to the table, the lower extremity was then prepped and draped in usual sterile fashion from the toes to the high thigh. Time-out  procedure was performed. Bunny Caroli. [facility] PAC, was present and scrubbed throughout the case, critical for assistance with, positioning, exposure, retraction, instrumentation, and closure.The skin and subcutaneous tissue along the incision was injected with 20 cc of a mixture of 20cc Exparel and 30cc Marcaine 50cc saline solution, using a 21-gauge by 1-1/2 inch needle. We began the operation, with the knee flexed 130 degrees, by making the anterior midline incision starting at handbreadth above the patella going over the patella 1 cm medial to and 4 cm distal to the tibial tubercle. Small bleeders in the skin and the subcutaneous tissue identified and cauterized. Transverse retinaculum was incised and reflected medially and a medial parapatellar arthrotomy was accomplished. the patella was everted and theprepatellar fat pad resected. The superficial medial collateral ligament was then elevated from anterior to posterior along the proximal flare of the tibia and anterior half of the menisci resected. The knee was hyperflexed exposing bone on bone arthritis. Peripheral  and notch osteophytes as well as the cruciate ligaments were then resected. We continued to work our way around posteriorly along the proximal tibia, and externally rotated the tibia subluxing it out from underneath the femur. A McHale PCL retractor was placed through the notch, a lateral Hohmann retractor, and anterolateral small homan retractor placed. We then entered the proximal tibia with the Depuy starter drill in line with the axis of the tibia followed by an intramedullary guide rod and 3 degree posterior slope cutting guide. The tibial cutting guide, was pinned into place allowing resection of 4 mm of bone medially and 10 mm of bone laterally. Satisfied with the tibial resection, we then entered the distal femur 2 mm anterior to the PCL origin with the starter drill, followed by the intramedullary guide rod and applied the distal femoral cutting  guide set at 9 mm, with 5 degrees of valgus. This was pinned along the epicondylar axis. At this point, the distal femoral cut was accomplished without difficulty. We then sized for a #5R femoral component and pinned the chamfer guide in 3 degrees of external rotation. The anterior, posterior, and chamfer cuts were accomplished without difficulty followed by the Attune RP box cutting guide and the box cut. We also removed posterior osteophytes from the posterior femoral condyles. The posterior capsule was injected with Exparel solution. The knee was brought into full extension. We checked our extension gap and fit a 6 mm trial lollipop. Distracting in extension with a lamina spreader,  bleeders in the posterior capsule, Posterior medial and posterior lateral gutter were cauterized.  The transexamic acid-soaked sponge was then placed in the gap of the knee in extension. The knee was flexed 30. The posterior patella cut was accomplished with the 9.5 mm Attune cutting guide, sized for a 35 mm dome, and the fixation pegs drilled.The knee was then once again hyperflexed exposing the proximal tibia. We sized for a # 6 tibial base plate, applied the smokestack and the conical reamer followed by the the Delta fin keel punch. We then hammered into place the Attune RP trial femoral component, drilled the lugs, inserted a  6 mm trial bearing, trial patellar button, and took the knee through range of motion from 0-130 degrees. Medial and lateral ligamentous stability was checked. No thumb pressure was required for patellar Tracking.  All trial components were removed, mating surfaces irrigated with pulse lavage, and dried with suction and sponges. Exparel solution was applied to the cancellus bone of the patella distal femur and proximal tibia.  After waiting 30 seconds, the bony surfaces were again, dried with sponges. A double batch of DePuy HV cement was mixed and applied to all bony metallic mating surfaces except for the  posterior condyles of the femur itself. In order, we hammered into place the tibial tray and removed excess cement, the femoral component and removed excess cement. The final Attune RP bearing was inserted, and the knee brought to full extension with compression. The patellar button was clamped into place, and excess cement removed. The knee was held at 30 flexion with compression using the second surefoot, while the cement cured. The wound was irrigated out with normal saline solution pulse lavage. The rest of the Exparel was injected into the parapatellar arthrotomy, subcutaneous tissues, and periosteal tissues. The parapatellar arthrotomy was closed with running #1 Vicryl suture. The subcutaneous tissue with 3-0 undyed Vicryl suture, and the skin with running 3-0 SQ vicryl. An Aquacil dressing and Ace wrap were applied. The  patient was taken to recovery room without difficulty.   Ilean Mall 02/13/2024, 11:34 AM

## 2024-02-13 NOTE — Discharge Instructions (Signed)

## 2024-02-13 NOTE — Anesthesia Procedure Notes (Signed)
 Spinal  Patient location during procedure: OR Start time: 02/13/2024 1:22 PM End time: 02/13/2024 1:24 PM Reason for block: surgical anesthesia Staffing Performed: resident/CRNA  Resident/CRNA: [place], Webster Patrone C, CRNA Performed by: [place], Avi Lemma, CRNA Authorized by: Ellena Gurney, MD   Preanesthetic Checklist Completed: patient identified, IV checked, site marked, risks and benefits discussed, surgical consent, monitors and equipment checked, pre-op evaluation and timeout performed Spinal Block Patient position: sitting Prep: DuraPrep and site prepped and draped Patient monitoring: heart rate, cardiac monitor, continuous pulse ox and blood pressure Approach: midline Location: L3-4 Injection technique: single-shot Needle Needle type: Pencan  Needle gauge: 24 G Needle length: 9 cm Assessment Sensory level: T4 Events: CSF return Additional Notes IV functioning, monitors applied to pt. Expiration date of kit checked and confirmed to be in date. Sterile prep and drape, hand hygiene and sterile gloved used. Pt was positioned and spine was prepped in sterile fashion. Skin was anesthetized with lidocaine . Free flow of clear CSF obtained prior to injecting local anesthetic into CSF x 1 attempt. Spinal needle aspirated freely following injection. Needle was carefully withdrawn, and pt tolerated procedure well. Loss of motor and sensory on exam post injection.

## 2024-02-13 NOTE — Transfer of Care (Signed)
 Immediate Anesthesia Transfer of Care Note  Patient: Kelia Rosa Molchan  Procedure(s) Performed: ARTHROPLASTY, KNEE, TOTAL (Right: Knee)  Patient Location: PACU  Anesthesia Type:Regional and Spinal  Level of Consciousness: awake  Airway & Oxygen Therapy: Patient Spontanous Breathing and Patient connected to face mask oxygen  Post-op Assessment: Report given to RN and Post -op Vital signs reviewed and stable  Post vital signs: Reviewed and stable  Last Vitals:  Vitals Value Taken Time  BP 120/66 02/13/24 1538  Temp    Pulse 57 02/13/24 1540  Resp 18 02/13/24 1540  SpO2 96 % 02/13/24 1540  Vitals shown include unfiled device data.  Last Pain:  Vitals:   02/13/24 1120  TempSrc:   PainSc: 0-No pain      Patients Stated Pain Goal: 4 (02/13/24 0933)  Complications: No notable events documented.

## 2024-02-13 NOTE — Progress Notes (Signed)
 Orthopedic Tech Progress Note Patient Details:  Mary Meyers [facility] 1946-06-23 [id number]  Ortho Devices Type of Ortho Device: Bone foam zero knee Ortho Device/Splint Location: RLE Ortho Device/Splint Interventions: Ordered, Application, Adjustment   Post Interventions Patient Tolerated: Well  Deeanna Beightol OTR/L 02/13/2024, 4:38 PM

## 2024-02-13 NOTE — Interval H&P Note (Signed)
 History and Physical Interval Note:  02/13/2024 11:33 AM  Mary Meyers  has presented today for surgery, with the diagnosis of RIGHT KNEE OSTEOARTHRITIS.  The various methods of treatment have been discussed with the patient and family. After consideration of risks, benefits and other options for treatment, the patient has consented to  Procedure(s) with comments: ARTHROPLASTY, KNEE, TOTAL (Right) - RIGHT TOTAL KNEE ARTHROPLASTY as a surgical intervention.  The patient's history has been reviewed, patient examined, no change in status, stable for surgery.  I have reviewed the patient's chart and labs.  Questions were answered to the patient's satisfaction.     Ilean Mall

## 2024-02-13 NOTE — Anesthesia Procedure Notes (Signed)
 Anesthesia Regional Block: Adductor canal block   Pre-Anesthetic Checklist: , timeout performed,  Correct Patient, Correct Site, Correct Laterality,  Correct Procedure, Correct Position, site marked,  Risks and benefits discussed,  Surgical consent,  Pre-op evaluation,  At surgeon's request and post-op pain management  Laterality: Right  Prep: chloraprep       Needles:  Injection technique: Single-shot  Needle Type: Echogenic Needle     Needle Length: 9cm  Needle Gauge: 21     Additional Needles:   Narrative:  Start time: 02/13/2024 11:13 AM End time: 02/13/2024 11:21 AM Injection made incrementally with aspirations every 5 mL.  Performed by: Personally  Anesthesiologist: Ellena Gurney, MD  Additional Notes: Pt tolerated the procedure well.

## 2024-02-13 NOTE — Plan of Care (Signed)
   Problem: Activity: Goal: Risk for activity intolerance will decrease Outcome: Progressing   Problem: Nutrition: Goal: Adequate nutrition will be maintained Outcome: Progressing   Problem: Pain Managment: Goal: General experience of comfort will improve and/or be controlled Outcome: Progressing   Problem: Safety: Goal: Ability to remain free from injury will improve Outcome: Progressing

## 2024-02-14 ENCOUNTER — Other Ambulatory Visit (HOSPITAL_COMMUNITY): Payer: Self-pay

## 2024-02-14 ENCOUNTER — Encounter (HOSPITAL_COMMUNITY): Payer: Self-pay | Admitting: Orthopedic Surgery

## 2024-02-14 ENCOUNTER — Other Ambulatory Visit: Payer: Self-pay

## 2024-02-14 DIAGNOSIS — M1711 Unilateral primary osteoarthritis, right knee: Secondary | ICD-10-CM | POA: Diagnosis not present

## 2024-02-14 LAB — GLUCOSE, CAPILLARY
Glucose-Capillary: 151 mg/dL — ABNORMAL HIGH (ref 70–99)
Glucose-Capillary: 178 mg/dL — ABNORMAL HIGH (ref 70–99)

## 2024-02-14 MED ORDER — HYDROMORPHONE HCL 2 MG PO TABS
2.0000 mg | ORAL_TABLET | ORAL | 0 refills | Status: DC | PRN
  Filled 2024-02-14: qty 30, 5d supply, fill #0

## 2024-02-14 MED ORDER — HYDROMORPHONE HCL 2 MG PO TABS: 2.0000 mg | ORAL_TABLET | ORAL | Status: DC | PRN

## 2024-02-14 MED ORDER — HYDROMORPHONE HCL 2 MG PO TABS
1.0000 mg | ORAL_TABLET | ORAL | Status: DC | PRN
  Administered 2024-02-14: 1 mg via ORAL
  Filled 2024-02-14: qty 1

## 2024-02-14 NOTE — Anesthesia Postprocedure Evaluation (Signed)
 Anesthesia Post Note  Patient: Tesslyn Rosa Boman  Procedure(s) Performed: ARTHROPLASTY, KNEE, TOTAL (Right: Knee)     Patient location during evaluation: PACU Anesthesia Type: MAC, Spinal and Regional Level of consciousness: oriented and awake and alert Pain management: pain level controlled Vital Signs Assessment: post-procedure vital signs reviewed and stable Respiratory status: spontaneous breathing, respiratory function stable and patient connected to nasal cannula oxygen Cardiovascular status: blood pressure returned to baseline and stable Postop Assessment: no headache, no backache and no apparent nausea or vomiting Anesthetic complications: no   No notable events documented.  Last Vitals:  Vitals:   02/14/24 1001 02/14/24 1411  BP: 117/61 129/66  Pulse: (!) 58 63  Resp: 18 18  Temp: 36.8 C 36.8 C  SpO2: 95% 94%    Last Pain:  Vitals:   02/14/24 1614  TempSrc:   PainSc: 0-No pain                 Amanda Steuart S

## 2024-02-14 NOTE — Progress Notes (Signed)
 Physical Therapy Treatment Patient Details Name: Mary Meyers MRN: [id number] DOB: 1946/05/03 Today's Date: 02/14/2024   History of Present Illness 78 yo female s/p R TKA on 02/13/24. PMH: PE, chronic dyspnea, HTN,  pelvic fx, vertigo    PT Comments  Pt progressing well. Pt able to amb incr distance without incr pain,  demonstrates improved RW safey and good carryover from prior session. Reviewed TKA HEP, plan is for OPPT tomorrow    If plan is discharge home, recommend the following: A little help with walking and/or transfers;Assist for transportation;Help with stairs or ramp for entrance;A little help with bathing/dressing/bathroom;Assistance with cooking/housework   Can travel by private vehicle        Equipment Recommendations  None recommended by PT    Recommendations for Other Services       Precautions / Restrictions Precautions Precautions: Fall;Knee Restrictions Weight Bearing Restrictions Per Provider Order: No     Mobility  Bed Mobility Overal bed mobility: Needs Assistance Bed Mobility: Supine to Sit     Supine to sit: Min assist     General bed mobility comments: in recliner    Transfers Overall transfer level: Needs assistance Equipment used: Rolling walker (2 wheels) Transfers: Sit to/from Stand Sit to Stand: Supervision           General transfer comment: cues for hand placement and RLE positioning    Ambulation/Gait Ambulation/Gait assistance: Supervision, Contact guard assist Gait Distance (Feet): 90 Feet Assistive device: Rolling walker (2 wheels) Gait Pattern/deviations: Step-to pattern       General Gait Details: cues for sequence and RW position, safety with turning; demonstrates carryover from prior session   Stairs             Wheelchair Mobility     Tilt Bed    Modified Rankin (Stroke Patients Only)       Balance                                            Communication  Communication Communication: No apparent difficulties  Cognition Arousal: Alert Behavior During Therapy: WFL for tasks assessed/performed   PT - Cognitive impairments: No apparent impairments                         Following commands: Intact      Cueing    Exercises Total Joint Exercises Ankle Circles/Pumps: AROM, Both, 10 reps Quad Sets: AROM, Both, 10 reps Heel Slides: AAROM, Right, 10 reps Straight Leg Raises: AROM, AAROM, Strengthening, Right, 10 reps    General Comments        Pertinent Vitals/Pain Pain Assessment Pain Assessment: 0-10 Pain Score: 4  Pain Location: right knee Pain Descriptors / Indicators: Sore, Discomfort Pain Intervention(s): Limited activity within patient's tolerance, Monitored during session, Premedicated before session, Repositioned, Ice applied    Home Living Family/patient expects to be discharged to:: Private residence Living Arrangements: Alone Available Help at Discharge: Family Type of Home: House Home Access: Stairs to enter   [employer] of Steps: threshold            Prior Function            PT Goals (current goals can now be found in the care plan section) Acute Rehab PT Goals PT Goal Formulation: With patient Time For Goal Achievement: 02/21/24 Potential to Achieve Goals:  Good Progress towards PT goals: Progressing toward goals    Frequency    7X/week      PT Plan      Co-evaluation              AM-PAC PT "6 Clicks" Mobility   Outcome Measure  Help needed turning from your back to your side while in a flat bed without using bedrails?: A Little Help needed moving from lying on your back to sitting on the side of a flat bed without using bedrails?: A Little Help needed moving to and from a bed to a chair (including a wheelchair)?: A Little Help needed standing up from a chair using your arms (e.g., wheelchair or bedside chair)?: A Little Help needed to walk in hospital room?: A  Little Help needed climbing 3-5 steps with a railing? : A Little 6 Click Score: 18    End of Session Equipment Utilized During Treatment: Gait belt Activity Tolerance: Patient tolerated treatment well Patient left: in chair;with call bell/phone within reach;with chair alarm set;with family/visitor present   PT Visit Diagnosis: Other abnormalities of gait and mobility (R26.89)     Time: 1440-1510 PT Time Calculation (min) (ACUTE ONLY): 30 min  Charges:    $Gait Training: 8-22 mins $Therapeutic Exercise: 8-22 mins PT General Charges $$ ACUTE PT VISIT: 1 Visit                     Shahid Flori, PT  Acute Rehab Dept ([facility]) [phone]  02/14/2024    [facility] 02/14/2024, 3:15 PM

## 2024-02-14 NOTE — Evaluation (Signed)
 Physical Therapy Evaluation Patient Details Name: Mary Meyers MRN: [id number] DOB: February 02, 1946 Today's Date: 02/14/2024  History of Present Illness  78yo female s/p R TKA on 02/13/24. PMH: PE, chronic dyspnea, HTN,  pelvic fx, vertigo  Clinical Impression  Pt is s/p TKA resulting in the deficits listed below (see PT Problem List).  Pt amb 80' with min assist progressing to  CGA for safety, cues throughout for sequence and RW position/step length. Will see for a second session and pt should be ready to d/c later today   Pt will benefit from acute skilled PT to increase their independence and safety with mobility to allow discharge.          If plan is discharge home, recommend the following: A little help with walking and/or transfers;Assist for transportation;Help with stairs or ramp for entrance;A little help with bathing/dressing/bathroom;Assistance with cooking/housework   Can travel by private vehicle        Equipment Recommendations None recommended by PT  Recommendations for Other Services       Functional Status Assessment Patient has had a recent decline in their functional status and demonstrates the ability to make significant improvements in function in a reasonable and predictable amount of time.     Precautions / Restrictions Precautions Precautions: Fall;Knee Restrictions Weight Bearing Restrictions Per Provider Order: No      Mobility  Bed Mobility Overal bed mobility: Needs Assistance Bed Mobility: Supine to Sit     Supine to sit: Min assist     General bed mobility comments: assist with RLE    Transfers Overall transfer level: Needs assistance Equipment used: Rolling walker (2 wheels) Transfers: Sit to/from Stand Sit to Stand: Contact guard assist, Min assist           General transfer comment: cues for hand placement and RLE positioning    Ambulation/Gait Ambulation/Gait assistance: Contact guard assist Gait Distance (Feet): 80  Feet Assistive device: Rolling walker (2 wheels) Gait Pattern/deviations: Step-to pattern       General Gait Details: cues for sequence and RW position  Stairs            Wheelchair Mobility     Tilt Bed    Modified Rankin (Stroke Patients Only)       Balance                                             Pertinent Vitals/Pain Pain Assessment Pain Assessment: 0-10 Pain Score: 3  Pain Location: right knee Pain Descriptors / Indicators: Sore, Discomfort Pain Intervention(s): Limited activity within patient's tolerance, Monitored during session, Premedicated before session    Home Living Family/patient expects to be discharged to:: Private residence Living Arrangements: Alone   Type of Home: House Home Access: Stairs to enter   Secretary[employer]administrator of Steps: threshold   Home Layout: One level Home Equipment: Toilet riser      Prior Function Prior Level of Function : Independent/Modified Independent                     Extremity/Trunk Assessment   Upper Extremity Assessment Upper Extremity Assessment: Overall WFL for tasks assessed    Lower Extremity Assessment Lower Extremity Assessment: RLE deficits/detail RLE Deficits / Details: ankle WFL, knee extension and hip flexion 2+/5       Communication   Communication Communication: No apparent difficulties  Cognition Arousal: Alert Behavior During Therapy: WFL for tasks assessed/performed   PT - Cognitive impairments: No apparent impairments                         Following commands: Intact       Cueing       General Comments      Exercises     Assessment/Plan    PT Assessment Patient needs continued PT services  PT Problem List Decreased strength;Decreased activity tolerance;Decreased mobility;Decreased range of motion;Decreased knowledge of use of DME       PT Treatment Interventions DME instruction;Therapeutic exercise;Gait  training;Functional mobility training;Therapeutic activities;Patient/family education    PT Goals (Current goals can be found in the Care Plan section)  Acute Rehab PT Goals PT Goal Formulation: With patient Time For Goal Achievement: 02/21/24 Potential to Achieve Goals: Good    Frequency       Co-evaluation               AM-PAC PT "6 Clicks" Mobility  Outcome Measure Help needed turning from your back to your side while in a flat bed without using bedrails?: A Little Help needed moving from lying on your back to sitting on the side of a flat bed without using bedrails?: A Little Help needed moving to and from a bed to a chair (including a wheelchair)?: A Little Help needed standing up from a chair using your arms (e.g., wheelchair or bedside chair)?: A Little Help needed to walk in hospital room?: A Little Help needed climbing 3-5 steps with a railing? : A Little 6 Click Score: 18    End of Session Equipment Utilized During Treatment: Gait belt Activity Tolerance: Patient tolerated treatment well Patient left: with call bell/phone within reach   PT Visit Diagnosis: Other abnormalities of gait and mobility (R26.89)    Time: 1045-1110 PT Time Calculation (min) (ACUTE ONLY): 25 min   Charges:   PT Evaluation $PT Eval Low Complexity: 1 Low PT Treatments $Gait Training: 8-22 mins PT General Charges $$ ACUTE PT VISIT: 1 Visit         Benjaman Artman, PT  Acute Rehab Dept [facility]) [phone]  02/14/2024   [facility] 02/14/2024, 11:27 AM

## 2024-02-14 NOTE — Progress Notes (Signed)
 AVS reviewed w/ pt & son- both verbalized an understanding. Ace wrap CDI- pt to remove at home on thursday . Pt rec'd hydromorphone at 1530- 1st time dose. No reactions noted at this time. PIV removed as noted. Pt dressing for d/c to home. TOC med delivered at 1545.

## 2024-02-14 NOTE — Progress Notes (Signed)
 Patient ID: Mary Meyers, female   DOB: 1946-08-07, 78 y.o.   MRN: [id number] PATIENT ID: Mary Meyers  MRN: [id number]  DOB/AGE:  August 23, 1946 / 78 y.o.  1 Day Post-Op Procedure(s) (LRB): ARTHROPLASTY, KNEE, TOTAL (Right)    PROGRESS NOTE Subjective: Patient is alert, oriented, no Nausea, no Vomiting, yes passing gas. Taking PO well. Denies SOB, Chest or Calf Pain. Using Incentive Spirometer, PAS in place. Ambulate WBAT, Patient reports pain as 3/10 .    Objective: Vital signs in last 24 hours: Vitals:   02/13/24 1858 02/13/24 2059 02/14/24 0147 02/14/24 0537  BP: 127/72 128/72 136/75 (!) 129/94  Pulse: (!) 58 65 60 (!) 58  Resp: 18 17 17 17   Temp:  97.9 F (36.6 C) 98.2 F (36.8 C) 97.7 F (36.5 C)  TempSrc:  Oral Oral Oral  SpO2: 93% 92% 94% 92%  Weight:      Height:          Intake/Output from previous day: I/O last 3 completed shifts: In: 2189.3 [P.O.:240; I.V.:1949.3] Out: 1450 [Urine:1150; Blood:300]   Intake/Output this shift: No intake/output data recorded.   LABORATORY DATA: Recent Labs    02/13/24 2101  GLUCAP 186*    Examination: Neurologically intact ABD soft Neurovascular intact Sensation intact distally Intact pulses distally Dorsiflexion/Plantar flexion intact Incision: dressing C/D/I No cellulitis present Compartment soft}  Assessment:   1 Day Post-Op Procedure(s) (LRB): ARTHROPLASTY, KNEE, TOTAL (Right) ADDITIONAL DIAGNOSIS: HTN, Oulm HTN  Patient's anticipated LOS is less than 2 midnights, meeting these requirements: - Younger than 77 - Lives within 1 hour of care - Has a competent adult at home to recover with post-op recover - NO history of  - Chronic pain requiring opiods  - Diabetes  - Coronary Artery Disease  - Heart failure  - Heart attack  - Stroke  - DVT/VTE  - Cardiac arrhythmia  - Respiratory Failure/COPD  - Renal failure  - Anemia  - Advanced Liver disease     Plan: PT/OT WBAT, AROM and PROM  DVT  Prophylaxis:  SCDx72hrs, ASA 81 mg BID x 2 weeks DISCHARGE PLAN: Home DISCHARGE NEEDS: Walker and 3-in-1 comode seat     Ilean Mall 02/14/2024, 7:14 AM

## 2024-02-14 NOTE — Plan of Care (Signed)
  Problem: Pain Managment: Goal: General experience of comfort will improve and/or be controlled Outcome: Progressing   Problem: Activity: Goal: Range of joint motion will improve Outcome: Progressing   Problem: Pain Management: Goal: Pain level will decrease with appropriate interventions Outcome: Progressing
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
## 2024-02-14 NOTE — TOC Transition Note (Signed)
 Transition of Care [facility]) - Discharge Note   Patient Details  Name: Mary Meyers MRN: [id number] Date of Birth: January 31, 1946  [facility]) CM/SW Contact:  Delilah Fend, LCSW Phone Number: 02/14/2024, 2:54 PM   Clinical Narrative:     Met with pt and son who confirm need for RW.  No DME agency preference.  Order placed with [employer] and item delivered to room.  Have confirmed with ortho PA, pt and son that she is already set up for OPPT at [facility].  No further TOC needs.  Final next level of care: OP Rehab Barriers to Discharge: No Barriers Identified   Patient Goals and CMS Choice Patient states their goals for this hospitalization and ongoing recovery are:: return home          Discharge Placement                       Discharge Plan and Services Additional resources added to the After Visit Summary for                  DME Arranged: Walker rolling DME Agency: [employer] Date DME Agency Contacted: 02/14/24 Time DME Agency Contacted: [phone] Representative spoke with at DME Agency: Kaitlyn            Social Drivers of Health (SDOH) Interventions SDOH Screenings   Food Insecurity: No Food Insecurity (02/13/2024)  Housing: Low Risk  (02/13/2024)  Transportation Needs: No Transportation Needs (02/13/2024)  Utilities: Not At Risk (02/13/2024)  Financial Resource Strain: Low Risk  (10/11/2023)   Received from [facility]  Social Connections: Moderately Integrated (02/13/2024)  Tobacco Use: Medium Risk (02/13/2024)     Readmission Risk Interventions     No data to display
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
## 2024-02-15 ENCOUNTER — Ambulatory Visit: Admitting: Physical Therapy
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
## 2024-02-16 ENCOUNTER — Ambulatory Visit: Admitting: Physical Therapy

## 2024-02-16 ENCOUNTER — Ambulatory Visit

## 2024-02-20 ENCOUNTER — Ambulatory Visit: Admitting: Physical Therapy

## 2024-02-20 ENCOUNTER — Encounter

## 2024-02-23 ENCOUNTER — Ambulatory Visit: Payer: Medicare Other | Admitting: Pulmonary Disease

## 2024-02-27 ENCOUNTER — Ambulatory Visit: Admitting: Physical Therapy

## 2024-02-28 ENCOUNTER — Encounter

## 2024-02-29 ENCOUNTER — Ambulatory Visit: Admitting: Physical Therapy

## 2024-03-05 ENCOUNTER — Ambulatory Visit: Admitting: Physical Therapy

## 2024-03-05 ENCOUNTER — Ambulatory Visit: Attending: Orthopedic Surgery

## 2024-03-05 DIAGNOSIS — M25661 Stiffness of right knee, not elsewhere classified: Secondary | ICD-10-CM | POA: Insufficient documentation

## 2024-03-05 DIAGNOSIS — M25561 Pain in right knee: Secondary | ICD-10-CM | POA: Insufficient documentation
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
## 2024-03-06 ENCOUNTER — Encounter

## 2024-03-07 ENCOUNTER — Ambulatory Visit: Admitting: Physical Therapy

## 2024-03-07 DIAGNOSIS — M25661 Stiffness of right knee, not elsewhere classified: Secondary | ICD-10-CM

## 2024-03-07 DIAGNOSIS — M25561 Pain in right knee: Secondary | ICD-10-CM | POA: Diagnosis not present
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
## 2024-03-08 ENCOUNTER — Encounter: Admitting: Physical Therapy

## 2024-03-08 ENCOUNTER — Encounter

## 2024-03-13 ENCOUNTER — Telehealth: Payer: Self-pay | Admitting: Physical Therapy

## 2024-03-13 ENCOUNTER — Ambulatory Visit: Admitting: Physical Therapy

## 2024-03-13 NOTE — Telephone Encounter (Signed)
 Called pt to inquire about absence from PT. Did not reach but left VM instructing pt to call back to reschedule and to call ahead of time if not going to be at appointment.

## 2024-03-14 ENCOUNTER — Encounter

## 2024-03-14 ENCOUNTER — Encounter: Admitting: Physical Therapy

## 2024-03-19 ENCOUNTER — Encounter: Admitting: Physical Therapy

## 2024-03-19 ENCOUNTER — Ambulatory Visit: Attending: Orthopedic Surgery

## 2024-03-19 DIAGNOSIS — M25661 Stiffness of right knee, not elsewhere classified: Secondary | ICD-10-CM | POA: Insufficient documentation

## 2024-03-19 DIAGNOSIS — M25561 Pain in right knee: Secondary | ICD-10-CM | POA: Diagnosis present
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
## 2024-03-20 ENCOUNTER — Encounter

## 2024-03-21 ENCOUNTER — Encounter: Admitting: Physical Therapy

## 2024-03-21 ENCOUNTER — Ambulatory Visit

## 2024-03-21 DIAGNOSIS — M25661 Stiffness of right knee, not elsewhere classified: Secondary | ICD-10-CM

## 2024-03-21 DIAGNOSIS — M25561 Pain in right knee: Secondary | ICD-10-CM | POA: Diagnosis not present
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
## 2024-03-22 ENCOUNTER — Encounter

## 2024-03-26 ENCOUNTER — Ambulatory Visit

## 2024-03-26 ENCOUNTER — Encounter: Admitting: Physical Therapy

## 2024-03-26 DIAGNOSIS — M25561 Pain in right knee: Secondary | ICD-10-CM

## 2024-03-26 DIAGNOSIS — M25661 Stiffness of right knee, not elsewhere classified: Secondary | ICD-10-CM
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
## 2024-03-27 ENCOUNTER — Encounter

## 2024-03-28 ENCOUNTER — Encounter: Admitting: Physical Therapy

## 2024-03-28 ENCOUNTER — Ambulatory Visit: Admitting: Physical Therapy

## 2024-03-29 ENCOUNTER — Ambulatory Visit: Admitting: Pulmonary Disease

## 2024-03-29 ENCOUNTER — Encounter

## 2024-04-02 ENCOUNTER — Ambulatory Visit: Admitting: Physical Therapy

## 2024-04-02 ENCOUNTER — Encounter: Admitting: Physical Therapy

## 2024-04-02 DIAGNOSIS — M25661 Stiffness of right knee, not elsewhere classified: Secondary | ICD-10-CM

## 2024-04-02 DIAGNOSIS — M25561 Pain in right knee: Secondary | ICD-10-CM
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
## 2024-04-03 ENCOUNTER — Encounter

## 2024-04-04 ENCOUNTER — Encounter: Admitting: Physical Therapy

## 2024-04-04 ENCOUNTER — Ambulatory Visit: Admitting: Physical Therapy

## 2024-04-05 ENCOUNTER — Encounter

## 2024-04-09 ENCOUNTER — Encounter: Admitting: Physical Therapy

## 2024-04-09 ENCOUNTER — Telehealth: Payer: Self-pay | Admitting: Physical Therapy

## 2024-04-09 NOTE — Telephone Encounter (Signed)
 Called pt to inform her that she had been discharged and that this was reason she no longer had appointments scheduled.

## 2024-04-10 ENCOUNTER — Encounter

## 2024-04-10 ENCOUNTER — Ambulatory Visit: Admitting: Physical Therapy

## 2024-04-11 ENCOUNTER — Encounter: Admitting: Physical Therapy

## 2024-04-12 ENCOUNTER — Encounter

## 2024-04-12 ENCOUNTER — Encounter: Admitting: Physical Therapy

## 2024-04-16 ENCOUNTER — Encounter: Admitting: Physical Therapy

## 2024-04-17 ENCOUNTER — Encounter

## 2024-04-18 ENCOUNTER — Encounter: Admitting: Physical Therapy

## 2024-04-19 ENCOUNTER — Encounter

## 2024-04-23 ENCOUNTER — Encounter: Admitting: Physical Therapy

## 2024-04-24 ENCOUNTER — Encounter

## 2024-04-25 ENCOUNTER — Encounter: Admitting: Physical Therapy

## 2024-04-26 ENCOUNTER — Ambulatory Visit: Admitting: Pulmonary Disease

## 2024-04-26 ENCOUNTER — Encounter

## 2024-04-26 ENCOUNTER — Encounter: Payer: Self-pay | Admitting: Pulmonary Disease

## 2024-04-26 VITALS — BP 134/96 | HR 68 | Temp 98.0°F | Ht 62.0 in | Wt 192.8 lb

## 2024-04-26 DIAGNOSIS — Z87891 Personal history of nicotine dependence: Secondary | ICD-10-CM | POA: Diagnosis not present

## 2024-04-26 DIAGNOSIS — R0609 Other forms of dyspnea: Secondary | ICD-10-CM

## 2024-04-26 DIAGNOSIS — G4733 Obstructive sleep apnea (adult) (pediatric): Secondary | ICD-10-CM | POA: Diagnosis not present

## 2024-04-26 DIAGNOSIS — J4540 Moderate persistent asthma, uncomplicated: Secondary | ICD-10-CM

## 2024-04-26 LAB — NITRIC OXIDE: Nitric Oxide: 48

## 2024-04-26 MED ORDER — TRELEGY ELLIPTA 200-62.5-25 MCG/ACT IN AEPB: 1.0000 | INHALATION_SPRAY | Freq: Every day | RESPIRATORY_TRACT | 0 refills | Status: DC

## 2024-04-26 MED ORDER — TRELEGY ELLIPTA 200-62.5-25 MCG/ACT IN AEPB: 1.0000 | INHALATION_SPRAY | Freq: Every day | RESPIRATORY_TRACT | 5 refills | Status: AC

## 2024-04-26 MED ORDER — ALBUTEROL SULFATE HFA 108 (90 BASE) MCG/ACT IN AERS: 2.0000 | INHALATION_SPRAY | Freq: Four times a day (QID) | RESPIRATORY_TRACT | 2 refills | Status: AC | PRN

## 2024-04-26 NOTE — Progress Notes (Signed)
 Subjective:    Patient ID: Mary Meyers, female    DOB: 1945-11-17, 78 y.o.   MRN: [id number]  Patient Care Team: Glover Lenis, MD as PCP - General (Family Medicine) Anner Lenis ORN, MD as PCP - Cardiology (Cardiology) Maurie Rayfield BIRCH, RN as Registered Nurse  Chief Complaint  Patient presents with   Follow-up    Shortness of breath on exertion. Using CPAP every night. No problems with mask or pressure.     BACKGROUND/INTERVAL:Patient is a 78 year old remote former smoker with minimal tobacco exposure in the past, who presents for follow-up on the issue of shortness of breath.  PMHx significant for HTN, pulmonary hypertension, GERD, hyperlipidemia, endometrial cancer, history of pulmonary embolism.  She was last seen by me on 22 November 2023.  She is on CPAP for moderate obstructive sleep apnea.  Today she presents for scheduled follow-up.  She had total right knee replacement in late April.  HPI Discussed the use of AI scribe software for clinical note transcription with the patient, who gave verbal consent to proceed.  History of Present Illness   Mary Meyers is a 78 year old female with obstructive sleep apnea and asthma who presents with shortness of breath on exertion.  She has been experiencing shortness of breath on exertion over the past few days, which she attributes to a recent period of inactivity following surgery. During this time, she did not exercise for two months. Her breathing improves with certain breathing exercises, but she still experiences a sensation of 'missing air' in the mornings despite sleeping well at night.  She is currently using Symbicort twice daily for her asthma, although she occasionally forgets the second dose. She does not have a rescue inhaler like albuterol , as it has not been provided to her previously. She mentions that the weather, specifically heat and humidity, may be contributing to her symptoms.   Her compliance download  shows 90% compliance during the month with only 57% compliance use over 4 hours.  When she uses the device consistently AHI is 1.5 she is set up at a CPAP level of 11 cm H2O.  She does exhibits significant leaks she states her mask is fitting well.  She did have some break in her therapy after her knee surgery.  She is now getting more consistent with it again.  DATA 04/15/2022 chest x-ray PA and lateral: Prominent right hilar region, unchanged, otherwise no active cardiopulmonary disease. 05/25/2022 echocardiogram: LVEF 60 to 65%, mild LVH, rate 1 DD, RV size moderately enlarged, trivial aortic valve regurgitation, dilated inferior vena cava suggesting right atrial pressure of 15 mgHg. 11/19/2022 PFTs: FEV1 1.91 L or 101% predicted, FVC 2.54 L or 100% predicted, FEV1/FVC 75%, volumes normal.  Diffusion capacity normal.  No significant bronchodilator response.  Minimal if any, obstruction but for the most part study is normal.  Study does not explain the patient's dyspnea. 11/28/2022 split-night sleep study: Moderate obstructive sleep apnea with AHI of 22 and SpO2 low of 78%.  She did well with CPAP at 11 cm H2O.  She was fitted with a medium large Fisher-Paykel nasal pillow provided a mask and heated humidity, chinstrap was provided. 12/16/2022 right heart cath: Hemodynamic findings consistent with mild pulmonary hypertension.  Measured numbers do not correlate with estimated numbers on 2D echo. 06/17/2023 chest x-ray portable: Hyperinflation with no acute disease. 06/17/2023 VQ scan: Low probability for PE.      Review of Systems A 10 point review of systems was performed  and it is as noted above otherwise negative.   Patient Active Problem List   Diagnosis Date Noted   S/P total knee arthroplasty, right 02/13/2024   Osteoarthritis of right knee 02/08/2024   Pulmonary hypertension (HCC) 12/16/2022   Chronic dyspnea 11/05/2022   PMB (postmenopausal bleeding) 03/18/2021   B12 deficiency  11/21/2020   Elevated glucose 11/21/2020   Vertigo 04/09/2019   History of pulmonary embolism 01/20/2018   Endometrial cancer (HCC) 12/13/2017   S/P TAH-BSO 11/14/2017   Hypertension 11/01/2017   Bradycardia 10/03/2017   Osteoarthritis of knee 02/07/2017   Tremor 10/29/2016   Arthritis 05/17/2016   Depression 05/17/2016   Gastroesophageal reflux disease without esophagitis 05/17/2016   Acquired hypothyroidism 09/22/2015   Hyperlipidemia 09/22/2015   Cough 03/27/2013   Abnormal chest CT 03/27/2013    Social History   Tobacco Use   Smoking status: Former    Current packs/day: 0.00    Types: Cigarettes    Quit date: 10/19/1971    Years since quitting: 52.5    Passive exposure: Never   Smokeless tobacco: Never  Substance Use Topics   Alcohol use: Not Currently    Allergies  Allergen Reactions   Cortisone Hives and Other (See Comments)    Burning/swelling.   Iodinated Contrast Media Other (See Comments)    Burning/swelling 11/15/22 - contrasted cardiac CTA w/ 13 hr prep , called back on 11/16/22 with itching, hives, swelling to face. Instructed patient to never have contrast ever again due to breakthru reaction   Prednisone  Hives   Tramadol Nausea And Vomiting and Other (See Comments)    dizziness    Current Meds  Medication Sig   albuterol  (VENTOLIN  HFA) 108 (90 Base) MCG/ACT inhaler Inhale 2 puffs into the lungs every 6 (six) hours as needed.   apixaban  (ELIQUIS ) 2.5 MG TABS tablet Take 1 tablet (2.5 mg total) by mouth 2 (two) times daily.   apixaban  (ELIQUIS ) 5 MG TABS tablet Take 5 mg by mouth See admin instructions. Take 5 mg twice daily only when traveling by plane more than 5 hours   Ascorbic Acid (VITAMIN C PO) Take 3 capsules by mouth daily.   atorvastatin  (LIPITOR) 20 MG tablet Take 20 mg by mouth daily.   buPROPion  (WELLBUTRIN  XL) 150 MG 24 hr tablet Take 150 mg by mouth daily.   cetirizine (ZYRTEC) 10 MG tablet Take 10 mg by mouth daily.   chlorhexidine   (HIBICLENS ) 4 % external liquid Apply topically as directed for 30 doses. Use as directed daily for 5 days every other week for 6 weeks.   EPINEPHrine  (EPIPEN  2-PAK) 0.3 mg/0.3 mL IJ SOAJ injection Inject 0.3 mg into the muscle as needed.   famotidine  (PEPCID ) 20 MG tablet Take 20 mg by mouth daily as needed for heartburn.   Fluticasone-Umeclidin-Vilant (TRELEGY ELLIPTA ) 200-62.5-25 MCG/ACT AEPB Inhale 1 puff into the lungs daily in the afternoon.   [START ON 05/07/2024] Fluticasone-Umeclidin-Vilant (TRELEGY ELLIPTA ) 200-62.5-25 MCG/ACT AEPB Inhale 1 puff into the lungs daily.   furosemide  (LASIX ) 20 MG tablet Take 20 mg by mouth as needed for edema.   HYDROmorphone  (DILAUDID ) 2 MG tablet Take 1 tablet (2 mg total) by mouth every 4 (four) hours as needed for severe pain (pain score 7-10).   levothyroxine  (SYNTHROID ) 100 MCG tablet Take 100 mcg by mouth daily before breakfast.   meclizine  (ANTIVERT ) 25 MG tablet Take 50 mg by mouth 3 (three) times daily as needed (for vertigo).   Multiple Vitamins-Minerals (HAIR SKIN AND NAILS FORMULA)  TABS Take 2 tablets by mouth daily.   Multiple Vitamins-Minerals (IMMUNE SUPPORT PO) Take 2 tablets by mouth daily.   OZEMPIC, 0.25 OR 0.5 MG/DOSE, 2 MG/3ML SOPN Inject 0.25 mg as directed once a week.   pantoprazole  (PROTONIX ) 40 MG tablet Take 40 mg by mouth daily.   potassium chloride (KLOR-CON) 10 MEQ tablet Take 10 mEq by mouth daily.   tiZANidine  (ZANAFLEX ) 2 MG tablet Take 1 tablet (2 mg total) by mouth every 6 (six) hours as needed for muscle spasms.   TURMERIC-GINGER PO Take 2 tablets by mouth daily.   venlafaxine  XR (EFFEXOR -XR) 150 MG 24 hr capsule Take 150 mg by mouth daily.   [DISCONTINUED] SYMBICORT 80-4.5 MCG/ACT inhaler Inhale 2 puffs into the lungs daily.    Immunization History  Administered Date(s) Administered   Fluad Quad(high Dose 65+) 06/18/2022, 08/04/2023   Influenza Split 10/23/2014   Influenza, High Dose Seasonal PF 06/24/2017,  07/07/2018   Influenza,inj,Quad PF,6+ Mos 08/07/2019   Influenza,inj,quad, With Preservative 07/17/2018   Influenza-Unspecified 10/23/2014, 10/25/2016, 06/24/2017, 07/08/2020, 08/04/2021, 07/23/2023   PFIZER Comirnaty(Gray Top)Covid-19 Tri-Sucrose Vaccine 11/03/2019, 11/27/2019, 09/16/2020, 03/17/2021   Pneumococcal Conjugate-13 04/14/2018   Pneumococcal Polysaccharide-23 04/09/2019   Tdap 04/06/2018   Unspecified SARS-COV-2 Vaccination 07/03/2021, 08/23/2023   Zoster Recombinant(Shingrix) 04/02/2023, 07/03/2023        Objective:     BP (!) 134/96 (BP Location: Left Arm, Patient Position: Sitting, Cuff Size: Large)   Pulse 68   Temp 98 F (36.7 C) (Oral)   Ht 5' 2 (1.575 m)   Wt 192 lb 12.8 oz (87.5 kg)   SpO2 97%   BMI 35.26 kg/m   SpO2: 97 %  GENERAL: Obese woman, no acute distress, no conversational dyspnea, fully ambulatory. HEAD: Normocephalic, atraumatic.  EYES: Pupils equal, round, reactive to light.  No scleral icterus.  MOUTH: Upper dentures, lower dentures, oral mucosa moist. NECK: Supple. No thyromegaly. Trachea midline. No JVD.  No adenopathy. PULMONARY: Good air entry bilaterally.  Coarse, otherwise, no adventitious sounds. CARDIOVASCULAR: S1 and S2. Regular rate and rhythm.  No rubs, murmurs or gallops heard.   ABDOMEN: Obese otherwise, benign. MUSCULOSKELETAL: No joint deformity, no clubbing, no edema.  NEUROLOGIC: No overt focal deficit, no gait disturbance, speech is fluent. SKIN: Intact,warm,dry. PSYCH: Mood and behavior normal.   Lab Results  Component Value Date   NITRICOXIDE 48 04/26/2024  *There is evidence of intermediate levels of type II inflammation  Assessment & Plan:     ICD-10-CM   1. Asthma, moderate persistent, poorly-controlled  J45.40     2. Chronic dyspnea  R06.09 Nitric oxide     3. OSA (obstructive sleep apnea)  G47.33       Orders Placed This Encounter  Procedures   Nitric oxide     Meds ordered this encounter   Medications   Fluticasone-Umeclidin-Vilant (TRELEGY ELLIPTA ) 200-62.5-25 MCG/ACT AEPB    Sig: Inhale 1 puff into the lungs daily in the afternoon.    Dispense:  1 each    Refill:  0    Lot Number?:   [id number]    Expiration Date?:   05/18/2025    Manufacturer?:   GlaxoSmithKline [[id number]]    NDC:   9826-9106-38 [332750]    Quantity:   1   Fluticasone-Umeclidin-Vilant (TRELEGY ELLIPTA ) 200-62.5-25 MCG/ACT AEPB    Sig: Inhale 1 puff into the lungs daily.    Dispense:  60 each    Refill:  5   albuterol  (VENTOLIN  HFA) 108 (90 Base) MCG/ACT inhaler  Sig: Inhale 2 puffs into the lungs every 6 (six) hours as needed.    Dispense:  8 g    Refill:  2   Discussion:    Asthma, moderate persistent, poorly controlled  Asthma is poorly controlled, evidenced by elevated nitric oxide  levels at 48 parts per billion. She reports shortness of breath on exertion and has been omitting the second dose of Symbicort due to forgetfulness. The current regimen may not be sufficient, and environmental factors such as heat and humidity may be contributing to symptoms. - Initiate Trelegy Ellipta  200, one inhalation daily.  Discontinue Symbicort. - Instruct to rinse mouth well after using Trelegy Ellipta . - Prescribe albuterol  for rescue use. - Schedule follow-up in two months.  Obstructive Sleep Apnea She reports sleeping well at night without issues related to apnea.  Needs to become more consistent with CPAP use, had recent hiatus due to knee surgery.     Advised if symptoms do not improve or worsen, to please contact office for sooner follow up or seek emergency care.    I spent 40 minutes of dedicated to the care of this patient on the date of this encounter to include pre-visit review of records, face-to-face time with the patient discussing conditions above, post visit ordering of testing, clinical documentation with the electronic health record, making appropriate referrals as documented, and communicating  necessary findings to members of the patients care team.     C. Leita Sanders, MD Advanced Bronchoscopy [facility]    *This note was generated using voice recognition software/Dragon and/or AI transcription program.  Despite best efforts to proofread, errors can occur which can change the meaning. Any transcriptional errors that result from this process are unintentional and may not be fully corrected at the time of dictation.

## 2024-04-26 NOTE — Patient Instructions (Addendum)
 VISIT SUMMARY:  Today, we discussed your recent shortness of breath on exertion, which you have been experiencing over the past few days. This may be related to a period of inactivity following your surgery. We reviewed your asthma management and made some adjustments to your treatment plan. We also touched on your obstructive sleep apnea, which you report is not currently causing issues with your sleep.  YOUR PLAN:  -ASTHMA: Asthma is a condition where your airways become inflamed and narrow, making it hard to breathe. Your asthma appears to be poorly controlled, as shown by your elevated nitric oxide  levels and your recent shortness of breath. We are starting you on Trelegy Ellipta  200, which you should take one inhalation daily. Please remember to rinse your mouth well after using it. Additionally, we are prescribing albuterol  for you to use as a rescue inhaler when you experience sudden symptoms. We will follow up in two months to see how you are doing.  -OBSTRUCTIVE SLEEP APNEA: Obstructive sleep apnea is a condition where your breathing stops and starts during sleep due to blocked airways. You mentioned that you are sleeping well at night and not experiencing issues related to apnea at this time.  INSTRUCTIONS:  Please schedule a follow-up appointment in two months to review your asthma management and overall progress. Make sure to use Trelegy Ellipta  daily and rinse your mouth after each use. Use albuterol  as needed for sudden asthma symptoms.   RESUMEN DE LA VISITA:  Hoy hablamos sobre su reciente dificultad para respirar al hacer ejercicio, que ha estado experimentando durante los [address]. Esto podra estar relacionado con un perodo de inactividad posterior a la [place]. Revisamos su manejo del asma e hicimos algunos ajustes a su plan de tratamiento. Tambin abordamos su apnea obstructiva del sueo, que segn usted no le est causando problemas para dormir actualmente.  SU  PLAN:  - ASMA: El asma es una afeccin en la que las vas respiratorias se inflaman y se Engineer, technical sales, lo que dificulta la respiracin. Su asma parece estar mal controlada, como lo demuestran sus niveles elevados de xido ntrico y su reciente dificultad para Industrial/product designer. Le estamos administrando Trelegy Ellipta  200, que debe administrar una inhalacin diaria. Recuerde enjuagarse bien la boca despus de usarlo. Adems, le recetamos albuterol  como inhalador de rescate cuando experimente sntomas repentinos. Le haremos seguimiento [employer] meses para ver cmo se encuentra.  APNEA OBSTRUCTIVA DEL SUEO: La apnea obstructiva del sueo es burkina faso afeccin en la que la respiracin se detiene y se reanuda durante el sueo debido a la obstruccin de las vas respiratorias. Usted mencion que duerme bien por la noche y que no presenta problemas relacionados con la apnea en este momento.  INSTRUCCIONES:  Programe una cita de seguimiento dentro de dos meses para revisar el control de su asma y su progreso general. Asegrese de usar Trelegy Ellipta  a diario y enjuagarse la boca despus de cada uso. Use albuterol  segn sea necesario para los sntomas repentinos de asma.

## 2024-05-01 ENCOUNTER — Encounter

## 2024-05-03 ENCOUNTER — Encounter

## 2024-05-07 ENCOUNTER — Telehealth: Payer: Self-pay | Admitting: Cardiology

## 2024-05-07 ENCOUNTER — Telehealth: Payer: Self-pay

## 2024-05-07 NOTE — Telephone Encounter (Signed)
 Clearance was provided > 2 months ago, therefore we will need to schedule virtual visit prior to providing clearance.  Per Maeola Lunch, NP at office visit 12/09/23 Per the patient she is not taking Eliquis . Someone gave that to her to take as needed for travel when she is in a plane 5 or more hours. The last time she took it was in Jan. As far as the ASA she can hold it as she only has nonobstructive disease and restart after surgery when OK with the surgeon.   Mary EMERSON Bane, NP-C  05/07/2024, 1:11 PM [address] [place], [place] [address] Office [phone] Fax [phone]

## 2024-05-07 NOTE — Telephone Encounter (Signed)
 Left message for pt to call our office and ask for the preop team to schedule TELE Preop appt.

## 2024-05-07 NOTE — Telephone Encounter (Signed)
 S/W pt and scheduled TELE preop appt 05/09/24. Med Rec and consent done.

## 2024-05-07 NOTE — Telephone Encounter (Signed)
   Pre-operative Risk Assessment    Patient Name: Mary Meyers  DOB: 07-25-1946 MRN: [id number]      Request for Surgical Clearance    Procedure:  Left Total Knee Replacement  Date of Surgery:  Clearance TBD                                 Surgeon:  Dr. Liam Socks [facility] or [facility] Phone number:  [phone] Fax number:  [phone]   Type of Clearance Requested:   - Medical  - Pharmacy:  Hold unclear on hold      Type of Anesthesia:  Spinal   Additional requests/questions:  None  Signed, April L Harrington   05/07/2024, 11:08 AM

## 2024-05-07 NOTE — Telephone Encounter (Signed)
 Med Rec and Consent done    Patient Consent for Virtual Visit        Mary Meyers has provided verbal consent on 05/07/2024 for a virtual visit (video or telephone).   CONSENT FOR VIRTUAL VISIT FOR:  Mary Meyers  By participating in this virtual visit I agree to the following:  I hereby voluntarily request, consent and authorize [facility] and its employed or contracted physicians, physician assistants, nurse practitioners or other licensed health care professionals (the Practitioner), to provide me with telemedicine health care services (the "Services) as deemed necessary by the treating Practitioner. I acknowledge and consent to receive the Services by the Practitioner via telemedicine. I understand that the telemedicine visit will involve communicating with the Practitioner through live audiovisual communication technology and the disclosure of certain medical information by electronic transmission. I acknowledge that I have been given the opportunity to request an in-person assessment or other available alternative prior to the telemedicine visit and am voluntarily participating in the telemedicine visit.  I understand that I have the right to withhold or withdraw my consent to the use of telemedicine in the course of my care at any time, without affecting my right to future care or treatment, and that the Practitioner or I may terminate the telemedicine visit at any time. I understand that I have the right to inspect all information obtained and/or recorded in the course of the telemedicine visit and may receive copies of available information for a reasonable fee.  I understand that some of the potential risks of receiving the Services via telemedicine include:  Delay or interruption in medical evaluation due to technological equipment failure or disruption; Information transmitted may not be sufficient (e.g. poor resolution of images) to allow for appropriate medical  decision making by the Practitioner; and/or  In rare instances, security protocols could fail, causing a breach of personal health information.  Furthermore, I acknowledge that it is my responsibility to provide information about my medical history, conditions and care that is complete and accurate to the best of my ability. I acknowledge that Practitioner's advice, recommendations, and/or decision may be based on factors not within their control, such as incomplete or inaccurate data provided by me or distortions of diagnostic images or specimens that may result from electronic transmissions. I understand that the practice of medicine is not an exact science and that Practitioner makes no warranties or guarantees regarding treatment outcomes. I acknowledge that a copy of this consent can be made available to me via my patient portal [facility] MyChart), or I can request a printed copy by calling the office of [facility].    I understand that my insurance will be billed for this visit.   I have read or had this consent read to me. I understand the contents of this consent, which adequately explains the benefits and risks of the Services being provided via telemedicine.  I have been provided ample opportunity to ask questions regarding this consent and the Services and have had my questions answered to my satisfaction. I give my informed consent for the services to be provided through the use of telemedicine in my medical care

## 2024-05-08 ENCOUNTER — Encounter

## 2024-05-09 ENCOUNTER — Ambulatory Visit: Attending: Cardiovascular Disease | Admitting: Cardiology

## 2024-05-09 DIAGNOSIS — Z01810 Encounter for preprocedural cardiovascular examination: Secondary | ICD-10-CM | POA: Insufficient documentation
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
## 2024-05-10 ENCOUNTER — Ambulatory Visit: Admitting: Pulmonary Disease

## 2024-05-10 ENCOUNTER — Encounter

## 2024-05-10 NOTE — Telephone Encounter (Signed)
 Please let me know once you are finished with her clearance so we can get it faxed back.

## 2024-05-11 NOTE — Telephone Encounter (Signed)
 Clearance has been placed in the fax folder to be faxed.

## 2024-05-14 NOTE — Telephone Encounter (Signed)
 Clearance has been faxed and we have received a confirmation.   Nothing further needed.

## 2024-05-15 ENCOUNTER — Encounter

## 2024-05-17 ENCOUNTER — Encounter

## 2024-05-24 ENCOUNTER — Other Ambulatory Visit: Payer: Self-pay

## 2024-05-24 ENCOUNTER — Other Ambulatory Visit: Payer: Self-pay | Admitting: Orthopedic Surgery

## 2024-05-24 ENCOUNTER — Encounter (HOSPITAL_COMMUNITY)
Admission: RE | Admit: 2024-05-24 | Discharge: 2024-05-24 | Disposition: A | Discharge status: Home / Self Care | Source: Ambulatory Visit | Attending: Orthopedic Surgery | Admitting: Orthopedic Surgery

## 2024-05-24 ENCOUNTER — Encounter (HOSPITAL_COMMUNITY): Payer: Self-pay

## 2024-05-24 VITALS — BP 134/65 | HR 64 | Temp 98.4°F | Resp 16 | Ht 63.0 in | Wt 192.0 lb

## 2024-05-24 DIAGNOSIS — M1712 Unilateral primary osteoarthritis, left knee: Secondary | ICD-10-CM | POA: Diagnosis present

## 2024-05-24 DIAGNOSIS — Z01812 Encounter for preprocedural laboratory examination: Secondary | ICD-10-CM | POA: Diagnosis present

## 2024-05-24 DIAGNOSIS — E039 Hypothyroidism, unspecified: Secondary | ICD-10-CM | POA: Diagnosis not present

## 2024-05-24 DIAGNOSIS — Z86711 Personal history of pulmonary embolism: Secondary | ICD-10-CM | POA: Diagnosis not present

## 2024-05-24 DIAGNOSIS — K219 Gastro-esophageal reflux disease without esophagitis: Secondary | ICD-10-CM | POA: Insufficient documentation

## 2024-05-24 DIAGNOSIS — I10 Essential (primary) hypertension: Secondary | ICD-10-CM | POA: Diagnosis not present

## 2024-05-24 DIAGNOSIS — Z87891 Personal history of nicotine dependence: Secondary | ICD-10-CM | POA: Diagnosis not present

## 2024-05-24 DIAGNOSIS — J45909 Unspecified asthma, uncomplicated: Secondary | ICD-10-CM | POA: Insufficient documentation

## 2024-05-24 DIAGNOSIS — Z01818 Encounter for other preprocedural examination: Secondary | ICD-10-CM

## 2024-05-24 DIAGNOSIS — G4730 Sleep apnea, unspecified: Secondary | ICD-10-CM | POA: Diagnosis not present

## 2024-05-24 DIAGNOSIS — R7303 Prediabetes: Secondary | ICD-10-CM | POA: Diagnosis not present

## 2024-05-24 LAB — BASIC METABOLIC PANEL WITH GFR
Anion gap: 13 (ref 5–15)
BUN: 31 mg/dL — ABNORMAL HIGH (ref 8–23)
CO2: 25 mmol/L (ref 22–32)
Calcium: 9 mg/dL (ref 8.9–10.3)
Chloride: 101 mmol/L (ref 98–111)
Creatinine, Ser: 1.43 mg/dL — ABNORMAL HIGH (ref 0.44–1.00)
GFR, Estimated: 38 mL/min — ABNORMAL LOW (ref >60)
Glucose, Bld: 100 mg/dL — ABNORMAL HIGH (ref 70–99)
Potassium: 3 mmol/L — ABNORMAL LOW (ref 3.5–5.1)
Sodium: 139 mmol/L (ref 135–145)

## 2024-05-24 LAB — SURGICAL PCR SCREEN
MRSA, PCR: NEGATIVE
Staphylococcus aureus: POSITIVE — AB

## 2024-05-24 LAB — CBC
HCT: 40.3 % (ref 36.0–46.0)
Hemoglobin: 13.4 g/dL (ref 12.0–15.0)
MCH: 30.1 pg (ref 26.0–34.0)
MCHC: 33.3 g/dL (ref 30.0–36.0)
MCV: 90.6 fL (ref 80.0–100.0)
Platelets: 421 K/uL — ABNORMAL HIGH (ref 150–400)
RBC: 4.45 MIL/uL (ref 3.87–5.11)
RDW: 13.2 % (ref 11.5–15.5)
WBC: 8.2 K/uL (ref 4.0–10.5)
nRBC: 0 % (ref 0.0–0.2)

## 2024-05-24 NOTE — Patient Instructions (Signed)
 SURGICAL WAITING ROOM VISITATION Patients having surgery or a procedure may have no more than 2 support people in the waiting area - these visitors may rotate in the visitor waiting room.   If the patient needs to stay at the hospital during part of their recovery, the visitor guidelines for inpatient rooms apply.  PRE-OP VISITATION  Pre-op nurse will coordinate an appropriate time for 1 support person to accompany the patient in pre-op.  This support person may not rotate.  This visitor will be contacted when the time is appropriate for the visitor to come back in the pre-op area.  Please refer to the [facility] website for the visitor guidelines for Inpatients (after your surgery is over and you are in a regular room).  You are not required to quarantine at this time prior to your surgery. However, you must do this: Hand Hygiene often Do NOT share personal items Notify your provider if you are in close contact with someone who has COVID or you develop fever 100.4 or greater, new onset of sneezing, cough, sore throat, shortness of breath or body aches.  If you test positive for Covid or have been in contact with anyone that has tested positive in the last 10 days please notify you surgeon.    Your procedure is scheduled on:  Monday  June 04, 2024  Report to [facility] Main Entrance: [address] entrance where the [employer] is available.   Report to admitting at: 0730    AM  Call this number if you have any questions or problems the morning of surgery [phone]  Do not eat food after Midnight the night prior to your surgery/procedure.  After Midnight you may have the following liquids until  07:00  AM DAY OF SURGERY  Clear Liquid Diet Water  Black Coffee (sugar ok, NO MILK/CREAM OR CREAMERS)  Tea (sugar ok, NO MILK/CREAM OR CREAMERS) regular and decaf                             Plain Jell-O  with no fruit (NO RED)                                           Fruit ices  (not with fruit pulp, NO RED)                                     Popsicles (NO RED)                                                                  Juice: NO CITRUS JUICES: only apple, WHITE grape, WHITE cranberry Sports drinks like Gatorade or Powerade (NO RED)                   The day of surgery:  Drink ONE (1) Pre-Surgery G2 at   07:00 AM the morning of surgery. Drink in one sitting. Do not sip.  This drink was given to you during your hospital pre-op appointment visit. Nothing else to drink after  completing the Pre-Surgery G2 : No candy, chewing gum or throat lozenges.    FOLLOW ANY ADDITIONAL PRE OP INSTRUCTIONS YOU RECEIVED FROM YOUR SURGEON'S OFFICE!!!   Oral Hygiene is also important to reduce your risk of infection.        Remember - BRUSH YOUR TEETH THE MORNING OF SURGERY WITH YOUR REGULAR TOOTHPASTE  Do NOT smoke after Midnight the night before surgery.  STOP TAKING all Vitamins, Herbs and supplements 1 week before your surgery.   Take ONLY these medicines the morning of surgery with A SIP OF WATER : Pantoprazole , levothyroxine , cetirizine, venlafaxine , bupropion  and you may use your inhalers. Please bring your Albuterol  inhaler with you on the day of surgery.   If You have been diagnosed with Sleep Apnea - Bring CPAP mask and tubing day of surgery. We will provide you with a CPAP machine on the day of your surgery.                   You may not have any metal on your body including hair pins, jewelry, and body piercing  Do not wear make-up, lotions, powders, perfumes  or deodorant  Do not wear nail polish including gel and S&S, artificial / acrylic nails, or any other type of covering on natural nails including finger and toenails. If you have artificial nails, gel coating, etc., that needs to be removed by a nail salon, Please have this removed prior to surgery. Not doing so may mean that your surgery could be cancelled or delayed if the Surgeon or anesthesia staff feels  like they are unable to monitor you safely.   Do not shave 48 hours prior to surgery to avoid nicks in your skin which may contribute to postoperative infections.   Contacts, Hearing Aids, dentures or bridgework may not be worn into surgery. DENTURES WILL BE REMOVED PRIOR TO SURGERY PLEASE DO NOT APPLY Poly grip OR ADHESIVES!!!  You may bring a small overnight bag with you on the day of surgery, only pack items that are not valuable. [place] IS NOT RESPONSIBLE   FOR VALUABLES THAT ARE LOST OR STOLEN.   Patients discharged on the day of surgery will not be allowed to drive home.  Someone NEEDS to stay with you for the first 24 hours after anesthesia.  Do not bring your home medications to the hospital. The Pharmacy will dispense medications listed on your medication list to you during your admission in the Hospital.  Please read over the following fact sheets you were given: IF YOU HAVE QUESTIONS ABOUT YOUR PRE-OP INSTRUCTIONS, PLEASE CALL [phone].     Pre-operative 5 CHG Bath Instructions   You can play a key role in reducing the risk of infection after surgery. Your skin needs to be as free of germs as possible. You can reduce the number of germs on your skin by washing with CHG (chlorhexidine  gluconate) soap before surgery. CHG is an antiseptic soap that kills germs and continues to kill germs even after washing.   DO NOT use if you have an allergy to chlorhexidine /CHG or antibacterial soaps. If your skin becomes reddened or irritated, stop using the CHG and notify one of our RNs at [phone]  Please shower with the CHG soap starting 4 days before surgery using the following schedule: START SHOWERS ON THURSDAY May 31, 2024  Please keep in mind the following:  DO NOT shave, including legs and  underarms, starting the day of your first shower.   You may shave your face at any point before/day of surgery.   Place clean sheets on your bed the day you start using CHG soap. Use a clean washcloth (not used since being washed) for each shower. DO NOT sleep with pets once you start using the CHG.   CHG Shower Instructions:  If you choose to wash your hair and private area, wash first with your normal shampoo/soap.  After you use shampoo/soap, rinse your hair and body thoroughly to remove shampoo/soap residue.  Turn the water  OFF and apply about 3 tablespoons (45 ml) of CHG soap to a CLEAN washcloth.  Apply CHG soap ONLY FROM YOUR NECK DOWN TO YOUR TOES (washing for 3-5 minutes)  DO NOT use CHG soap on face, private areas, open wounds, or sores.  Pay special attention to the area where your surgery is being performed.  If you are having back surgery, having someone wash your back for you may be helpful.  Wait 2 minutes after CHG soap is applied, then you may rinse off the CHG soap.  Pat dry with a clean towel  Put on clean clothes/pajamas   If you choose to wear lotion, please use ONLY the CHG-compatible lotions on the back of this paper.     Additional instructions for the day of surgery: DO NOT APPLY any lotions, deodorants, cologne, or perfumes.   Put on clean/comfortable clothes.  Brush your teeth.  Ask your nurse before applying any prescription medications to the skin.      CHG Compatible Lotions   Aveeno Moisturizing lotion  Cetaphil Moisturizing Cream  Cetaphil Moisturizing Lotion  Clairol Herbal Essence Moisturizing Lotion, Dry Skin  Clairol Herbal Essence Moisturizing Lotion, Extra Dry Skin  Clairol Herbal Essence Moisturizing Lotion, Normal Skin  Curel Age Defying Therapeutic Moisturizing Lotion with Alpha Hydroxy  Curel Extreme Care Body Lotion  Curel Soothing Hands Moisturizing Hand Lotion  Curel Therapeutic Moisturizing Cream, Fragrance-Free  Curel  Therapeutic Moisturizing Lotion, Fragrance-Free  Curel Therapeutic Moisturizing Lotion, Original Formula  Eucerin Daily Replenishing Lotion  Eucerin Dry Skin Therapy Plus Alpha Hydroxy Crme  Eucerin Dry Skin Therapy Plus Alpha Hydroxy Lotion  Eucerin Original Crme  Eucerin Original Lotion  Eucerin Plus Crme Eucerin Plus Lotion  Eucerin TriLipid Replenishing Lotion  Keri Anti-Bacterial Hand Lotion  Keri Deep Conditioning Original Lotion Dry Skin Formula Softly Scented  Keri Deep Conditioning Original Lotion, Fragrance Free Sensitive Skin Formula  Keri Lotion Fast Absorbing Fragrance Free Sensitive Skin Formula  Keri Lotion Fast Absorbing Softly Scented Dry Skin Formula  Keri Original Lotion  Keri Skin Renewal Lotion Keri Silky Smooth Lotion  Keri Silky Smooth Sensitive Skin Lotion  Nivea Body Creamy Conditioning Oil  Nivea Body Extra Enriched Lotion  Nivea Body Original Lotion  Nivea Body Sheer Moisturizing Lotion Nivea Crme  Nivea Skin Firming Lotion  NutraDerm 30 Skin Lotion  NutraDerm Skin Lotion  NutraDerm Therapeutic Skin Cream  NutraDerm Therapeutic Skin Lotion  ProShield Protective Hand Cream  Provon moisturizing lotion   FAILURE TO FOLLOW THESE INSTRUCTIONS MAY RESULT IN THE CANCELLATION OF YOUR SURGERY  PATIENT SIGNATURE_________________________________  NURSE SIGNATURE__________________________________  ________________________________________________________________________        Mary Meyers    An incentive spirometer is a tool that can help keep your lungs clear and active. This tool measures how well you are filling your lungs with each breath.  Taking long deep breaths may help reverse or decrease the chance of developing breathing (pulmonary) problems (especially infection) following: A long period of time when you are unable to move or be active. BEFORE THE PROCEDURE  If the spirometer includes an indicator to show your best effort, your  nurse or respiratory therapist will set it to a desired goal. If possible, sit up straight or lean slightly forward. Try not to slouch. Hold the incentive spirometer in an upright position. INSTRUCTIONS FOR USE  Sit on the edge of your bed if possible, or sit up as far as you can in bed or on a chair. Hold the incentive spirometer in an upright position. Breathe out normally. Place the mouthpiece in your mouth and seal your lips tightly around it. Breathe in slowly and as deeply as possible, raising the piston or the ball toward the top of the column. Hold your breath for 3-5 seconds or for as long as possible. Allow the piston or ball to fall to the bottom of the column. Remove the mouthpiece from your mouth and breathe out normally. Rest for a few seconds and repeat Steps 1 through 7 at least 10 times every 1-2 hours when you are awake. Take your time and take a few normal breaths between deep breaths. The spirometer may include an indicator to show your best effort. Use the indicator as a goal to work toward during each repetition. After each set of 10 deep breaths, practice coughing to be sure your lungs are clear. If you have an incision (the cut made at the time of surgery), support your incision when coughing by placing a pillow or rolled up towels firmly against it. Once you are able to get out of bed, walk around indoors and cough well. You may stop using the incentive spirometer when instructed by your caregiver.  RISKS AND COMPLICATIONS Take your time so you do not get dizzy or light-headed. If you are in pain, you may need to take or ask for pain medication before doing incentive spirometry. It is harder to take a deep breath if you are having pain. AFTER USE Rest and breathe slowly and easily. It can be helpful to keep track of a log of your progress. Your caregiver can provide you with a simple table to help with this. If you are using the spirometer at home, follow these  instructions: SEEK MEDICAL CARE IF:  You are having difficultly using the spirometer. You have trouble using the spirometer as often as instructed. Your pain medication is not giving enough relief while using the spirometer. You develop fever of 100.5 F (38.1 C) or higher.                                                                                                    SEEK IMMEDIATE MEDICAL CARE IF:  You cough up bloody sputum that had not been present before. You develop fever of 102 F (38.9 C) or greater. You develop worsening pain at or near the incision site. MAKE SURE YOU:  Understand these instructions. Will watch your  condition. Will get help right away if you are not doing well or get worse. Document Released: 02/14/2007 Document Revised: 12/27/2011 Document Reviewed: 04/17/2007 [facility] Patient Information 2014 [place], [place].      If you would like to see a video about joint replacement:   [email or web address]

## 2024-05-24 NOTE — Progress Notes (Addendum)
 COVID Vaccine Completed: yes  Date of COVID positive in last 90 days: none   PCP - Alm Na, MD LOV 03-05-24 Cardiologist - Alm Clay, MD, Thom Sluder, PA-C cardiac clearance in 05-09-24 Epic note Pulmonologist- Dedra Sanders, MD LOV 04-26-24  Clearance in note    Chest x-ray - 06/17/23  1v Epic EKG - 12/09/23 Epic Stress Test - 07/06/19 [facility] ECHO - 05/25/22 Epic Cardiac Cath - 12/16/22 Epic  Pacemaker/ICD device last checked: n/a Spinal Cord Stimulator: n/a    Sleep Study - yes CPAP - yes every night per pt    Fasting Blood Sugar - preDM, no checks at home. Was taking Ozempic but is on hold for surgery Last A1c: 5.9 on 03-11-24    Blood Thinner Instructions: Eliquis , not taking, only when on planes for more than 5 hours Aspirin  Instructions: none   Activity level: Can go up a flight of stairs and perform activities of daily living without stopping and without symptoms of chest pain or shortness of breath.           Anesthesia review: non obstructive CAD, PE, HTN, OSA, chronic dyspnea- Pulm. HTN   Patient denies shortness of breath, fever, cough and chest pain at PAT appointment   Patient verbalized understanding of instructions that were given to them at the PAT appointment. Patient was also instructed that they will need to review over the PAT instructions again at home before surgery.
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
## 2024-05-25 NOTE — Progress Notes (Signed)
 Patient's PCR screen is positive for STAPH. Appropriate notes have been placed on the patient's chart. This note has been routed to Dr.  Liam and Lynwood Reining, PA for review. The Patient's surgery is currently scheduled for: 06-04-2024  at [facility].  Shawnee Aloe, BSN, CVRN-BC   Pre-Surgical Testing Nurse [facility]  [phone]

## 2024-06-01 DIAGNOSIS — M1712 Unilateral primary osteoarthritis, left knee: Secondary | ICD-10-CM | POA: Diagnosis present
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
## 2024-06-04 ENCOUNTER — Other Ambulatory Visit: Payer: Self-pay

## 2024-06-04 ENCOUNTER — Ambulatory Visit (HOSPITAL_BASED_OUTPATIENT_CLINIC_OR_DEPARTMENT_OTHER): Payer: Self-pay

## 2024-06-04 ENCOUNTER — Ambulatory Visit (HOSPITAL_COMMUNITY): Payer: Self-pay | Admitting: Physician Assistant

## 2024-06-04 ENCOUNTER — Encounter (HOSPITAL_COMMUNITY)
Admission: RE | Disposition: A | Discharge status: Home / Self Care | Payer: Self-pay | Source: Home / Self Care | Attending: Orthopedic Surgery

## 2024-06-04 ENCOUNTER — Encounter (HOSPITAL_COMMUNITY): Payer: Self-pay | Admitting: Orthopedic Surgery

## 2024-06-04 ENCOUNTER — Observation Stay (HOSPITAL_COMMUNITY)
Admission: RE | Admit: 2024-06-04 | Discharge: 2024-06-06 | Disposition: A | Discharge status: Home / Self Care | Attending: Orthopedic Surgery | Admitting: Orthopedic Surgery

## 2024-06-04 DIAGNOSIS — M1712 Unilateral primary osteoarthritis, left knee: Secondary | ICD-10-CM | POA: Diagnosis not present

## 2024-06-04 DIAGNOSIS — Z96652 Presence of left artificial knee joint: Secondary | ICD-10-CM

## 2024-06-04 DIAGNOSIS — I10 Essential (primary) hypertension: Secondary | ICD-10-CM | POA: Diagnosis not present

## 2024-06-04 DIAGNOSIS — K219 Gastro-esophageal reflux disease without esophagitis: Secondary | ICD-10-CM | POA: Insufficient documentation

## 2024-06-04 DIAGNOSIS — Z96651 Presence of right artificial knee joint: Secondary | ICD-10-CM | POA: Insufficient documentation

## 2024-06-04 DIAGNOSIS — I272 Pulmonary hypertension, unspecified: Secondary | ICD-10-CM | POA: Insufficient documentation

## 2024-06-04 DIAGNOSIS — Z86711 Personal history of pulmonary embolism: Secondary | ICD-10-CM | POA: Insufficient documentation

## 2024-06-04 DIAGNOSIS — R0602 Shortness of breath: Secondary | ICD-10-CM | POA: Diagnosis not present

## 2024-06-04 DIAGNOSIS — E039 Hypothyroidism, unspecified: Secondary | ICD-10-CM | POA: Diagnosis not present

## 2024-06-04 DIAGNOSIS — Z87891 Personal history of nicotine dependence: Secondary | ICD-10-CM | POA: Diagnosis not present

## 2024-06-04 DIAGNOSIS — F418 Other specified anxiety disorders: Secondary | ICD-10-CM | POA: Diagnosis not present

## 2024-06-04 DIAGNOSIS — Z79899 Other long term (current) drug therapy: Secondary | ICD-10-CM | POA: Insufficient documentation

## 2024-06-04 DIAGNOSIS — M25562 Pain in left knee: Secondary | ICD-10-CM | POA: Diagnosis present

## 2024-06-04 HISTORY — PX: TOTAL KNEE ARTHROPLASTY: SHX125

## 2024-06-04 LAB — GLUCOSE, CAPILLARY
Glucose-Capillary: 112 mg/dL — ABNORMAL HIGH (ref 70–99)
Glucose-Capillary: 105 mg/dL — ABNORMAL HIGH (ref 70–99)
Glucose-Capillary: 131 mg/dL — ABNORMAL HIGH (ref 70–99)

## 2024-06-04 SURGERY — ARTHROPLASTY, KNEE, TOTAL
Anesthesia: Regional | Site: Knee | Laterality: Left

## 2024-06-04 MED ORDER — BUPROPION HCL ER (XL) 150 MG PO TB24
150.0000 mg | ORAL_TABLET | Freq: Every day | ORAL | Status: DC
Start: 2024-06-05 — End: 2024-06-06
  Administered 2024-06-05 – 2024-06-06 (×2): 150 mg via ORAL
  Filled 2024-06-04 (×2): qty 1

## 2024-06-04 MED ORDER — BUPIVACAINE-EPINEPHRINE (PF) 0.25% -1:200000 IJ SOLN
INTRAMUSCULAR | Status: DC | PRN
  Administered 2024-06-04: 120 mL

## 2024-06-04 MED ORDER — MENTHOL 3 MG MT LOZG: 1.0000 | LOZENGE | OROMUCOSAL | Status: DC | PRN

## 2024-06-04 MED ORDER — DIPHENHYDRAMINE HCL 12.5 MG/5ML PO ELIX: 12.5000 mg | ORAL_SOLUTION | ORAL | Status: DC | PRN

## 2024-06-04 MED ORDER — CHLORHEXIDINE GLUCONATE 0.12 % MT SOLN
15.0000 mL | Freq: Once | OROMUCOSAL | Status: AC
  Administered 2024-06-04: 15 mL via OROMUCOSAL

## 2024-06-04 MED ORDER — FAMOTIDINE 20 MG PO TABS: 20.0000 mg | ORAL_TABLET | Freq: Every day | ORAL | Status: DC | PRN

## 2024-06-04 MED ORDER — PROPOFOL 500 MG/50ML IV EMUL
INTRAVENOUS | Status: DC | PRN
  Administered 2024-06-04: 20 mg via INTRAVENOUS
  Administered 2024-06-04: 50 ug/kg/min via INTRAVENOUS

## 2024-06-04 MED ORDER — ACETAMINOPHEN 10 MG/ML IV SOLN
INTRAVENOUS | Status: AC
Start: 2024-06-04 — End: 2024-06-04
  Filled 2024-06-04: qty 100

## 2024-06-04 MED ORDER — EPHEDRINE 5 MG/ML INJ
INTRAVENOUS | Status: AC
Start: 2024-06-04 — End: 2024-06-04
  Filled 2024-06-04: qty 5

## 2024-06-04 MED ORDER — PANTOPRAZOLE SODIUM 40 MG PO TBEC
40.0000 mg | DELAYED_RELEASE_TABLET | Freq: Every day | ORAL | Status: DC
  Administered 2024-06-04 – 2024-06-06 (×3): 40 mg via ORAL
  Filled 2024-06-04 (×3): qty 1

## 2024-06-04 MED ORDER — METOCLOPRAMIDE HCL 5 MG PO TABS: 5.0000 mg | ORAL_TABLET | Freq: Three times a day (TID) | ORAL | Status: DC | PRN

## 2024-06-04 MED ORDER — POLYETHYLENE GLYCOL 3350 17 G PO PACK: 17.0000 g | PACK | Freq: Every day | ORAL | Status: DC | PRN

## 2024-06-04 MED ORDER — HYDROMORPHONE HCL 2 MG PO TABS
2.0000 mg | ORAL_TABLET | ORAL | Status: DC | PRN
  Administered 2024-06-04 – 2024-06-05 (×2): 2 mg via ORAL
  Administered 2024-06-06 (×3): 3 mg via ORAL
  Filled 2024-06-04: qty 2
  Filled 2024-06-04: qty 1
  Filled 2024-06-04 (×3): qty 2

## 2024-06-04 MED ORDER — MECLIZINE HCL 25 MG PO TABS: 50.0000 mg | ORAL_TABLET | Freq: Three times a day (TID) | ORAL | Status: DC | PRN

## 2024-06-04 MED ORDER — METHOCARBAMOL 1000 MG/10ML IJ SOLN: 500.0000 mg | Freq: Four times a day (QID) | INTRAMUSCULAR | Status: DC | PRN

## 2024-06-04 MED ORDER — DOCUSATE SODIUM 100 MG PO CAPS
100.0000 mg | ORAL_CAPSULE | Freq: Two times a day (BID) | ORAL | Status: DC
  Administered 2024-06-04 – 2024-06-06 (×4): 100 mg via ORAL
  Filled 2024-06-04 (×4): qty 1

## 2024-06-04 MED ORDER — LEVOTHYROXINE SODIUM 100 MCG PO TABS
100.0000 ug | ORAL_TABLET | Freq: Every day | ORAL | Status: DC
Start: 2024-06-05 — End: 2024-06-06
  Administered 2024-06-05 – 2024-06-06 (×2): 100 ug via ORAL
  Filled 2024-06-04 (×2): qty 1

## 2024-06-04 MED ORDER — BUPIVACAINE-EPINEPHRINE (PF) 0.25% -1:200000 IJ SOLN
INTRAMUSCULAR | Status: AC
  Filled 2024-06-04: qty 30

## 2024-06-04 MED ORDER — INSULIN ASPART 100 UNIT/ML IJ SOLN
0.0000 [IU] | Freq: Three times a day (TID) | INTRAMUSCULAR | Status: DC
  Administered 2024-06-05: 2 [IU] via SUBCUTANEOUS
  Administered 2024-06-05: 3 [IU] via SUBCUTANEOUS
  Administered 2024-06-06: 2 [IU] via SUBCUTANEOUS

## 2024-06-04 MED ORDER — ALBUTEROL SULFATE (2.5 MG/3ML) 0.083% IN NEBU
3.0000 mL | INHALATION_SOLUTION | Freq: Four times a day (QID) | RESPIRATORY_TRACT | Status: DC | PRN
  Administered 2024-06-06: 3 mL via RESPIRATORY_TRACT
  Filled 2024-06-04: qty 3

## 2024-06-04 MED ORDER — LACTATED RINGERS IV BOLUS
500.0000 mL | Freq: Once | INTRAVENOUS | Status: AC
  Administered 2024-06-04: 500 mL via INTRAVENOUS

## 2024-06-04 MED ORDER — POTASSIUM CHLORIDE CRYS ER 10 MEQ PO TBCR
10.0000 meq | EXTENDED_RELEASE_TABLET | Freq: Every day | ORAL | Status: DC
  Administered 2024-06-05 – 2024-06-06 (×2): 10 meq via ORAL
  Filled 2024-06-04 (×2): qty 1

## 2024-06-04 MED ORDER — ONDANSETRON HCL 4 MG/2ML IJ SOLN
INTRAMUSCULAR | Status: AC
Start: 2024-06-04 — End: 2024-06-04
  Filled 2024-06-04: qty 2

## 2024-06-04 MED ORDER — OXYCODONE HCL 5 MG PO TABS: 5.0000 mg | ORAL_TABLET | Freq: Once | ORAL | Status: DC | PRN

## 2024-06-04 MED ORDER — TRIAMTERENE-HCTZ 75-50 MG PO TABS
1.0000 | ORAL_TABLET | Freq: Every day | ORAL | Status: DC
  Administered 2024-06-05 – 2024-06-06 (×2): 1 via ORAL
  Filled 2024-06-04 (×2): qty 1

## 2024-06-04 MED ORDER — LACTATED RINGERS IV SOLN: INTRAVENOUS | Status: DC

## 2024-06-04 MED ORDER — WATER FOR IRRIGATION, STERILE IR SOLN
Status: DC | PRN
  Administered 2024-06-04: 2000 mL

## 2024-06-04 MED ORDER — PROPOFOL 10 MG/ML IV BOLUS
INTRAVENOUS | Status: AC
  Filled 2024-06-04: qty 20

## 2024-06-04 MED ORDER — ACETAMINOPHEN 325 MG PO TABS: 325.0000 mg | ORAL_TABLET | Freq: Four times a day (QID) | ORAL | Status: DC | PRN

## 2024-06-04 MED ORDER — FENTANYL CITRATE PF 50 MCG/ML IJ SOSY
50.0000 ug | PREFILLED_SYRINGE | INTRAMUSCULAR | Status: DC
  Administered 2024-06-04: 50 ug via INTRAVENOUS
  Filled 2024-06-04: qty 2

## 2024-06-04 MED ORDER — METHOCARBAMOL 500 MG PO TABS
ORAL_TABLET | ORAL | Status: AC
  Filled 2024-06-04: qty 1

## 2024-06-04 MED ORDER — APIXABAN 2.5 MG PO TABS: 2.5000 mg | ORAL_TABLET | Freq: Two times a day (BID) | ORAL | 0 refills | Status: AC

## 2024-06-04 MED ORDER — INSULIN ASPART 100 UNIT/ML IJ SOLN: 0.0000 [IU] | INTRAMUSCULAR | Status: DC | PRN

## 2024-06-04 MED ORDER — SODIUM CHLORIDE 0.9 % IV SOLN
INTRAVENOUS | Status: DC | PRN
  Administered 2024-06-04: 2000 mg via TOPICAL

## 2024-06-04 MED ORDER — EPHEDRINE SULFATE-NACL 50-0.9 MG/10ML-% IV SOSY
PREFILLED_SYRINGE | INTRAVENOUS | Status: DC | PRN
  Administered 2024-06-04 (×5): 5 mg via INTRAVENOUS

## 2024-06-04 MED ORDER — ROPIVACAINE HCL 5 MG/ML IJ SOLN
INTRAMUSCULAR | Status: DC | PRN
  Administered 2024-06-04: 20 mL via PERINEURAL

## 2024-06-04 MED ORDER — TRANEXAMIC ACID 1000 MG/10ML IV SOLN
2000.0000 mg | INTRAVENOUS | Status: DC
  Filled 2024-06-04: qty 20

## 2024-06-04 MED ORDER — BUPIVACAINE IN DEXTROSE 0.75-8.25 % IT SOLN
INTRATHECAL | Status: DC | PRN
  Administered 2024-06-04: 1.6 mL via INTRATHECAL

## 2024-06-04 MED ORDER — ONDANSETRON HCL 4 MG/2ML IJ SOLN: 4.0000 mg | Freq: Four times a day (QID) | INTRAMUSCULAR | Status: DC | PRN

## 2024-06-04 MED ORDER — HYDROMORPHONE HCL 1 MG/ML IJ SOLN
0.5000 mg | INTRAMUSCULAR | Status: DC | PRN
  Administered 2024-06-04 – 2024-06-05 (×3): 1 mg via INTRAVENOUS
  Filled 2024-06-04 (×3): qty 1

## 2024-06-04 MED ORDER — FENTANYL CITRATE PF 50 MCG/ML IJ SOSY: 25.0000 ug | PREFILLED_SYRINGE | INTRAMUSCULAR | Status: DC | PRN

## 2024-06-04 MED ORDER — KCL IN DEXTROSE-NACL 20-5-0.45 MEQ/L-%-% IV SOLN
INTRAVENOUS | Status: DC
  Filled 2024-06-04 (×4): qty 1000

## 2024-06-04 MED ORDER — DROPERIDOL 2.5 MG/ML IJ SOLN: 0.6250 mg | Freq: Once | INTRAMUSCULAR | Status: DC | PRN

## 2024-06-04 MED ORDER — ORAL CARE MOUTH RINSE: 15.0000 mL | Freq: Once | OROMUCOSAL | Status: AC

## 2024-06-04 MED ORDER — HYDROMORPHONE HCL 2 MG PO TABS
ORAL_TABLET | ORAL | Status: AC
  Filled 2024-06-04: qty 1

## 2024-06-04 MED ORDER — POVIDONE-IODINE 10 % EX SWAB: 2.0000 | Freq: Once | CUTANEOUS | Status: DC

## 2024-06-04 MED ORDER — HYDROMORPHONE HCL 2 MG PO TABS: 2.0000 mg | ORAL_TABLET | ORAL | 0 refills | Status: AC | PRN

## 2024-06-04 MED ORDER — FUROSEMIDE 20 MG PO TABS: 20.0000 mg | ORAL_TABLET | ORAL | Status: DC | PRN

## 2024-06-04 MED ORDER — PHENOL 1.4 % MT LIQD: 1.0000 | OROMUCOSAL | Status: DC | PRN

## 2024-06-04 MED ORDER — 0.9 % SODIUM CHLORIDE (POUR BTL) OPTIME
TOPICAL | Status: DC | PRN
Start: 2024-06-04 — End: 2024-06-04
  Administered 2024-06-04: 1000 mL

## 2024-06-04 MED ORDER — TRANEXAMIC ACID-NACL 1000-0.7 MG/100ML-% IV SOLN
1000.0000 mg | INTRAVENOUS | Status: AC
  Administered 2024-06-04: 1000 mg via INTRAVENOUS
  Filled 2024-06-04: qty 100

## 2024-06-04 MED ORDER — LORATADINE 10 MG PO TABS
10.0000 mg | ORAL_TABLET | Freq: Every day | ORAL | Status: DC
  Administered 2024-06-05 – 2024-06-06 (×2): 10 mg via ORAL
  Filled 2024-06-04 (×2): qty 1

## 2024-06-04 MED ORDER — HYDROMORPHONE HCL 2 MG PO TABS
1.0000 mg | ORAL_TABLET | ORAL | Status: DC | PRN
  Administered 2024-06-04 – 2024-06-05 (×5): 2 mg via ORAL
  Filled 2024-06-04 (×3): qty 1

## 2024-06-04 MED ORDER — TIZANIDINE HCL 2 MG PO TABS
2.0000 mg | ORAL_TABLET | Freq: Four times a day (QID) | ORAL | 0 refills | Status: AC | PRN
Start: 2024-06-04 — End: ?

## 2024-06-04 MED ORDER — ONDANSETRON HCL 4 MG/2ML IJ SOLN
INTRAMUSCULAR | Status: DC | PRN
  Administered 2024-06-04: 4 mg via INTRAVENOUS

## 2024-06-04 MED ORDER — MIDAZOLAM HCL 2 MG/2ML IJ SOLN: 1.0000 mg | INTRAMUSCULAR | Status: DC

## 2024-06-04 MED ORDER — ALUM & MAG HYDROXIDE-SIMETH 200-200-20 MG/5ML PO SUSP: 30.0000 mL | ORAL | Status: DC | PRN

## 2024-06-04 MED ORDER — PROPOFOL 1000 MG/100ML IV EMUL
INTRAVENOUS | Status: AC
  Filled 2024-06-04: qty 100

## 2024-06-04 MED ORDER — VENLAFAXINE HCL ER 150 MG PO CP24
150.0000 mg | ORAL_CAPSULE | Freq: Every day | ORAL | Status: DC
  Administered 2024-06-05 – 2024-06-06 (×2): 150 mg via ORAL
  Filled 2024-06-04 (×2): qty 1

## 2024-06-04 MED ORDER — SODIUM CHLORIDE (PF) 0.9 % IJ SOLN
INTRAMUSCULAR | Status: AC
  Filled 2024-06-04: qty 20

## 2024-06-04 MED ORDER — BUPIVACAINE LIPOSOME 1.3 % IJ SUSP: 20.0000 mL | Freq: Once | INTRAMUSCULAR | Status: DC

## 2024-06-04 MED ORDER — OXYCODONE HCL 5 MG/5ML PO SOLN: 5.0000 mg | Freq: Once | ORAL | Status: DC | PRN

## 2024-06-04 MED ORDER — MUPIROCIN 2 % EX OINT
1.0000 | TOPICAL_OINTMENT | Freq: Once | CUTANEOUS | Status: AC
  Administered 2024-06-04: 1 via TOPICAL
  Filled 2024-06-04: qty 22

## 2024-06-04 MED ORDER — CEFAZOLIN SODIUM-DEXTROSE 2-4 GM/100ML-% IV SOLN
2.0000 g | INTRAVENOUS | Status: AC
  Administered 2024-06-04: 2 g via INTRAVENOUS
  Filled 2024-06-04: qty 100

## 2024-06-04 MED ORDER — NON FORMULARY: 1.0000 | Freq: Every day | Status: DC

## 2024-06-04 MED ORDER — APIXABAN 2.5 MG PO TABS
2.5000 mg | ORAL_TABLET | Freq: Two times a day (BID) | ORAL | Status: DC
  Administered 2024-06-05 – 2024-06-06 (×3): 2.5 mg via ORAL
  Filled 2024-06-04 (×3): qty 1

## 2024-06-04 MED ORDER — METHOCARBAMOL 500 MG PO TABS
500.0000 mg | ORAL_TABLET | Freq: Four times a day (QID) | ORAL | Status: DC | PRN
  Administered 2024-06-04 – 2024-06-06 (×6): 500 mg via ORAL
  Filled 2024-06-04 (×5): qty 1

## 2024-06-04 MED ORDER — METOCLOPRAMIDE HCL 5 MG/ML IJ SOLN: 5.0000 mg | Freq: Three times a day (TID) | INTRAMUSCULAR | Status: DC | PRN

## 2024-06-04 MED ORDER — SODIUM CHLORIDE (PF) 0.9 % IJ SOLN
INTRAMUSCULAR | Status: AC
  Filled 2024-06-04: qty 50

## 2024-06-04 MED ORDER — HYDROMORPHONE HCL 2 MG PO TABS
ORAL_TABLET | ORAL | Status: AC
Start: 2024-06-04 — End: 2024-06-04
  Filled 2024-06-04: qty 1

## 2024-06-04 MED ORDER — FLUTICASONE-UMECLIDIN-VILANT 200-62.5-25 MCG/ACT IN AEPB: 1.0000 | INHALATION_SPRAY | Freq: Every day | RESPIRATORY_TRACT | Status: DC

## 2024-06-04 MED ORDER — ACETAMINOPHEN 500 MG PO TABS
1000.0000 mg | ORAL_TABLET | Freq: Four times a day (QID) | ORAL | Status: AC
  Administered 2024-06-04 – 2024-06-05 (×4): 1000 mg via ORAL
  Filled 2024-06-04 (×5): qty 2

## 2024-06-04 MED ORDER — ONDANSETRON HCL 4 MG PO TABS: 4.0000 mg | ORAL_TABLET | Freq: Four times a day (QID) | ORAL | Status: DC | PRN

## 2024-06-04 MED ORDER — LACTATED RINGERS IV BOLUS
250.0000 mL | Freq: Once | INTRAVENOUS | Status: AC
  Administered 2024-06-04: 250 mL via INTRAVENOUS

## 2024-06-04 MED ORDER — TRANEXAMIC ACID-NACL 1000-0.7 MG/100ML-% IV SOLN: 1000.0000 mg | Freq: Once | INTRAVENOUS | Status: DC

## 2024-06-04 MED ORDER — ACETAMINOPHEN 10 MG/ML IV SOLN
1000.0000 mg | Freq: Once | INTRAVENOUS | Status: DC | PRN
Start: 2024-06-04 — End: 2024-06-04
  Administered 2024-06-04: 1000 mg via INTRAVENOUS

## 2024-06-04 MED ORDER — FLEET ENEMA RE ENEM: 1.0000 | ENEMA | Freq: Once | RECTAL | Status: DC | PRN

## 2024-06-04 MED ORDER — BUPIVACAINE LIPOSOME 1.3 % IJ SUSP
INTRAMUSCULAR | Status: AC
  Filled 2024-06-04: qty 20

## 2024-06-04 MED ORDER — BISACODYL 5 MG PO TBEC: 5.0000 mg | DELAYED_RELEASE_TABLET | Freq: Every day | ORAL | Status: DC | PRN

## 2024-06-04 SURGICAL SUPPLY — 40 items
ATTUNE PS FEM LT SZ 5 CEM KNEE (Femur) IMPLANT
ATTUNE PSRP INSR SZ5 5 KNEE (Insert) IMPLANT
BAG COUNTER SPONGE SURGICOUNT (BAG) IMPLANT
BAG DECANTER FOR FLEXI CONT (MISCELLANEOUS) ×1 IMPLANT
BAG ZIPLOCK 12X15 (MISCELLANEOUS) ×1 IMPLANT
BASE TIBIAL ROT PLAT SZ 5 KNEE (Knees) IMPLANT
BLADE SAG 18X100X1.27 (BLADE) ×1 IMPLANT
BLADE SAW SGTL 11.0X1.19X90.0M (BLADE) ×1 IMPLANT
BNDG ELASTIC 6X10 VLCR STRL LF (GAUZE/BANDAGES/DRESSINGS) ×1 IMPLANT
BOWL SMART MIX CTS (DISPOSABLE) ×1 IMPLANT
CEMENT HV SMART SET (Cement) ×2 IMPLANT
COVER SURGICAL LIGHT HANDLE (MISCELLANEOUS) ×1 IMPLANT
DRAPE INCISE IOBAN 66X45 STRL (DRAPES) IMPLANT
DRAPE U-SHAPE 47X51 STRL (DRAPES) ×1 IMPLANT
DRSG AQUACEL AG ADV 3.5X10 (GAUZE/BANDAGES/DRESSINGS) ×1 IMPLANT
DURAPREP 26ML APPLICATOR (WOUND CARE) ×1 IMPLANT
ELECT REM PT RETURN 15FT ADLT (MISCELLANEOUS) ×1 IMPLANT
GLOVE BIO SURGEON STRL SZ7.5 (GLOVE) ×1 IMPLANT
GLOVE BIO SURGEON STRL SZ8.5 (GLOVE) ×1 IMPLANT
GLOVE BIOGEL PI IND STRL 8 (GLOVE) ×1 IMPLANT
GLOVE BIOGEL PI IND STRL 9 (GLOVE) ×1 IMPLANT
GOWN STRL REUS W/ TWL XL LVL3 (GOWN DISPOSABLE) ×2 IMPLANT
HOOD PEEL AWAY T7 (MISCELLANEOUS) ×3 IMPLANT
KIT TURNOVER KIT A (KITS) ×1 IMPLANT
NDL HYPO 22X1.5 SAFETY MO (MISCELLANEOUS) ×2 IMPLANT
NEEDLE HYPO 22X1.5 SAFETY MO (MISCELLANEOUS) ×2 IMPLANT
NS IRRIG 1000ML POUR BTL (IV SOLUTION) ×1 IMPLANT
PACK TOTAL KNEE CUSTOM (KITS) ×1 IMPLANT
PATELLA MEDIAL ATTUN 35MM KNEE (Knees) IMPLANT
PIN STEINMAN FIXATION KNEE (PIN) IMPLANT
PROTECTOR NERVE ULNAR (MISCELLANEOUS) ×1 IMPLANT
SET HNDPC FAN SPRY TIP SCT (DISPOSABLE) ×1 IMPLANT
SUT VIC AB 0 CT1 36 (SUTURE) ×1 IMPLANT
SUT VIC AB 1 CTX36XBRD ANBCTR (SUTURE) ×1 IMPLANT
SUT VICRYL+ 3-0 36IN CT-1 (SUTURE) ×3 IMPLANT
SYR CONTROL 10ML LL (SYRINGE) ×2 IMPLANT
TRAY CATH INTERMITTENT SS 16FR (CATHETERS) ×1 IMPLANT
TUBE SUCTION HIGH CAP CLEAR NV (SUCTIONS) ×1 IMPLANT
WATER STERILE IRR 1000ML POUR (IV SOLUTION) ×2 IMPLANT
WRAP KNEE MAXI GEL POST OP (GAUZE/BANDAGES/DRESSINGS) ×1 IMPLANT

## 2024-06-04 NOTE — Progress Notes (Signed)
 Orthopedic Tech Progress Note Patient Details:  Mary Meyers 1946/10/08 [id number] Applied bone foam per order.  Ortho Devices Type of Ortho Device: Bone foam zero knee Ortho Device/Splint Location: LLE Ortho Device/Splint Interventions: Ordered, Application, Adjustment   Post Interventions Patient Tolerated: Well Instructions Provided: Adjustment of device, Care of device, Poper ambulation with device  Morna Pink 06/04/2024, 12:46 PM

## 2024-06-04 NOTE — Interval H&P Note (Signed)
 History and Physical Interval Note:  06/04/2024 8:48 AM  Mary Meyers  has presented today for surgery, with the diagnosis of LEFT KNEE OSTEOARTHRITIS.  The various methods of treatment have been discussed with the patient and family. After consideration of risks, benefits and other options for treatment, the patient has consented to  Procedure(s): ARTHROPLASTY, KNEE, TOTAL (Left) as a surgical intervention.  The patient's history has been reviewed, patient examined, no change in status, stable for surgery.  I have reviewed the patient's chart and labs.  Questions were answered to the patient's satisfaction.     Dempsey JINNY Sensor

## 2024-06-04 NOTE — Anesthesia Preprocedure Evaluation (Addendum)
 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 Left ventricular ejection fraction, by estimation, is 60 to 65%. The  left ventricle has normal function. The left ventricle has no regional  wall motion abnormalities. There is mild left ventricular hypertrophy.  Left ventricular diastolic parameters  are consistent with Grade I diastolic dysfunction (impaired relaxation).   2. Right ventricular systolic function is normal. The right ventricular  size is moderately enlarged.   3. The mitral valve is normal in structure. No evidence of mitral valve  regurgitation.   4. The aortic valve was not well visualized. Aortic valve regurgitation  is trivial.   5. The inferior vena cava is dilated in size with <50% respiratory  variability, suggesting right atrial pressure of 15 mmHg.     Neuro/Psych neg Seizures PSYCHIATRIC DISORDERS Anxiety Depression       GI/Hepatic ,GERD  ,,  Endo/Other  Hypothyroidism  Class 3 obesity  Renal/GU      Musculoskeletal  (+) Arthritis ,    Abdominal   Peds  Hematology   Anesthesia Other Findings   Reproductive/Obstetrics                              Anesthesia Physical Anesthesia Plan  ASA: 3  Anesthesia Plan: Spinal and Regional   Post-op Pain  Management: Tylenol  PO (pre-op)* and Regional block*   Induction: Intravenous  PONV Risk Score and Plan: 1 and Ondansetron , Dexamethasone , TIVA and Treatment may vary due to age or medical condition  Airway Management Planned: Simple Face Mask and Natural Airway  Additional Equipment:   Intra-op Plan:   Post-operative Plan:   Informed Consent: I have reviewed the patients History and Physical, chart, labs and discussed the procedure including the risks, benefits and alternatives for the proposed anesthesia with the patient or authorized representative who has indicated his/her understanding and acceptance.     Dental advisory given  Plan Discussed with: CRNA  Anesthesia Plan Comments: (See PAT note from 4/15  78 y.o. former smoker with h/o HTN, GERD, sleep apnea, hypothyroidism, left knee OA scheduled for above procedure 06/04/2024 with Dr. Dempsey Sensor.    Per cardiology preoperative evaluation 05/09/24, Preoperative Cardiovascular Risk Assessment: According to the Revised Cardiac Risk Index (RCRI), her Perioperative Risk of Major Cardiac Event is (%): 0.9. Her Functional Capacity in METs is: 8.97 according to the Duke Activity Status Index (DASI).  This patient is doing well from a cardiac perspective.  Therefore, based on ACC/AHA guidelines, the patient would be at acceptable risk for the planned procedure without further cardiovascular testing.  She is on GLP-1, reportedly was already holding this 1 month prior.  Resume per surgeon.   Prior history of PE-Per office note she is no longer taking Eliquis .  Reportedly has this as needed for any  flight time greater than 5 or more hours.   Pt follows with pulmonology for asthma, sleep apnea.  Clearance received which states pt is low risk.  )         Anesthesia Quick Evaluation

## 2024-06-04 NOTE — Progress Notes (Signed)
 Physical Therapy Treatment Patient Details Name: Mary Meyers MRN: [id number] DOB: 1946/02/07 Today's Date: 06/04/2024   History of Present Illness 77yo female presents to therapy s/p L TKA on 06/04/2024 due to failure of conservative measures. Pt  PMH includes but is not limited to: PE, chronic dyspnea, HTN,  pelvic fx, vertigo and R TKA on 02/13/24.    PT Comments   Mary Meyers is a 78 y.o. female POD 0 s/p L TKA. Patient reports IND with mobility at baseline. Patient is now limited by functional impairments (see PT problem list below) and requires min A for sit to supine for bed mobility and CGA for transfers with SPT recliner to [facility] and BSC to standard hospital bed. Patient was able to ambulate 5 feet with RW and CGA level of assist. Pt limited with gait tasks due to reports of dizziness, PT assessed Bp seated and in standing pt reporting no light headedness or dizziness however with gait tasks sudden onset. Patient instructed in exercise to facilitate ROM and circulation to manage edema in sitting. Patient will benefit from continued skilled PT interventions to address impairments and progress towards PLOF. Acute PT will follow to progress mobility and stair training in preparation for safe discharge home with family support and OPPT services.    If plan is discharge home, recommend the following: A little help with walking and/or transfers;A little help with bathing/dressing/bathroom;Assistance with cooking/housework;Assist for transportation;Help with stairs or ramp for entrance   Can travel by private vehicle        Equipment Recommendations  None recommended by PT    Recommendations for Other Services       Precautions / Restrictions Precautions Precautions: Fall;Knee (monitor Bp orthostatic) Restrictions Weight Bearing Restrictions Per Provider Order: No     Mobility  Bed Mobility Overal bed mobility: Needs Assistance Bed Mobility: Sit to Supine     Supine to  sit: Supervision Sit to supine: Min assist   General bed mobility comments: pt seated in recliner when PT returned    Transfers Overall transfer level: Needs assistance Equipment used: Rolling walker (2 wheels) Transfers: Sit to/from Stand, Bed to chair/wheelchair/BSC Sit to Stand: Contact guard assist Stand pivot transfers: Contact guard assist         General transfer comment: min cues    Ambulation/Gait Ambulation/Gait assistance: Contact guard assist Gait Distance (Feet): 5 Feet Assistive device: Rolling walker (2 wheels) Gait Pattern/deviations: Step-to pattern, Antalgic, Trunk flexed, Knee hyperextension - left, Decreased stance time - left Gait velocity: decreased     General Gait Details: min cues for safety, posture, RW management with L knee instabiltiy noted in stance phase with hyperextension, B UE support at RW to offload  L LE in stance phase pt reported feeling light headed and dizzy and recliner placed behind pt   Stairs             Wheelchair Mobility     Tilt Bed    Modified Rankin (Stroke Patients Only)       Balance                                            Communication Communication Communication: No apparent difficulties  Cognition Arousal: Alert Behavior During Therapy: WFL for tasks assessed/performed   PT - Cognitive impairments: No apparent impairments  Following commands: Intact      Cueing    Exercises Total Joint Exercises Ankle Circles/Pumps: AROM, Both, 10 reps Quad Sets: AROM, Left, 5 reps Short Arc Quad: AROM, Left, 5 reps Heel Slides: AROM, Left, 5 reps Hip ABduction/ADduction: AROM, Left, 5 reps Straight Leg Raises: AROM, Left, 5 reps Knee Flexion: AROM, Left, 5 reps, Seated    General Comments        Pertinent Vitals/Pain Pain Assessment Pain Assessment: 0-10 Pain Score: 9  Pain Location: L knee and LE Pain Descriptors / Indicators: Aching,  Constant, Discomfort, Dull, Grimacing, Operative site guarding Pain Intervention(s): Limited activity within patient's tolerance, Monitored during session, Patient requesting pain meds-RN notified, Repositioned, RN gave pain meds during session, Ice applied    Home Living Family/patient expects to be discharged to:: Private residence Living Arrangements: Alone Available Help at Discharge: Family Type of Home: House Home Access: Stairs to enter   [employer] of Steps: threshold   Home Layout: One level Home Equipment: Educational psychologist (2 wheels);Cane - single point      Prior Function            PT Goals (current goals can now be found in the care plan section) Acute Rehab PT Goals Patient Stated Goal: travel to [place] to further study the language PT Goal Formulation: With patient Time For Goal Achievement: 06/18/24 Potential to Achieve Goals: Good Progress towards PT goals: Progressing toward goals    Frequency    7X/week      PT Plan      Co-evaluation              AM-PAC PT 6 Clicks Mobility   Outcome Measure  Help needed turning from your back to your side while in a flat bed without using bedrails?: None Help needed moving from lying on your back to sitting on the side of a flat bed without using bedrails?: A Little Help needed moving to and from a bed to a chair (including a wheelchair)?: A Little Help needed standing up from a chair using your arms (e.g., wheelchair or bedside chair)?: A Little Help needed to walk in hospital room?: A Little Help needed climbing 3-5 steps with a railing? : Total 6 Click Score: 17    End of Session Equipment Utilized During Treatment: Gait belt Activity Tolerance: Treatment limited secondary to medical complications (Comment);Patient limited by pain (symptomatic hypotension) Patient left: with call bell/phone within reach;with family/visitor present;with nursing/sitter in room;in bed Nurse  Communication: Mobility status (pt not appropriate at this time for same day d/c) PT Visit Diagnosis: Unsteadiness on feet (R26.81);Other abnormalities of gait and mobility (R26.89);Muscle weakness (generalized) (M62.81);Difficulty in walking, not elsewhere classified (R26.2);Pain Pain - Right/Left: Left Pain - part of body: Knee;Leg     Time: 1715-1750 PT Time Calculation (min) (ACUTE ONLY): 35 min  Charges:    $Gait Training: 8-22 mins $Therapeutic Exercise: 8-22 mins $Therapeutic Activity: 8-22 mins PT General Charges $$ ACUTE PT VISIT: 1 Visit                     [place], PT Acute Rehab    [place] VEAR Drone 06/04/2024, 6:01 PM

## 2024-06-04 NOTE — Discharge Instructions (Addendum)

## 2024-06-04 NOTE — Anesthesia Procedure Notes (Signed)
 Anesthesia Regional Block: Adductor canal block   Pre-Anesthetic Checklist: , timeout performed,  Correct Patient, Correct Site, Correct Laterality,  Correct Procedure, Correct Position, site marked,  Risks and benefits discussed,  Surgical consent,  Pre-op evaluation,  At surgeon's request and post-op pain management  Laterality: Left  Prep: chloraprep       Needles:  Injection technique: Single-shot  Needle Type: Echogenic Stimulator Needle     Needle Length: 9cm  Needle Gauge: 21     Additional Needles:   Procedures:,,,, ultrasound used (permanent image in chart),,    Narrative:  Start time: 06/04/2024 9:15 AM End time: 06/04/2024 9:20 AM Injection made incrementally with aspirations every 5 mL.  Performed by: Personally  Anesthesiologist: Erma Thom SAUNDERS, MD  Additional Notes: Discussed risks and benefits of the nerve block in detail, including but not limited vascular injury, permanent nerve damage and infection.   Patient tolerated the procedure well. Local anesthetic introduced in an incremental fashion under minimal resistance after negative aspirations. No paresthesias were elicited. After completion of the procedure, no acute issues were identified and patient continued to be monitored by RN.

## 2024-06-04 NOTE — Anesthesia Postprocedure Evaluation (Signed)
 Anesthesia Post Note  Patient: Mary Meyers  Procedure(s) Performed: ARTHROPLASTY, KNEE, TOTAL (Left: Knee)     Patient location during evaluation: PACU Anesthesia Type: Regional and Spinal Level of consciousness: awake and alert Pain management: pain level controlled Vital Signs Assessment: post-procedure vital signs reviewed and stable Respiratory status: spontaneous breathing, nonlabored ventilation, respiratory function stable and patient connected to nasal cannula oxygen Cardiovascular status: blood pressure returned to baseline and stable Postop Assessment: no apparent nausea or vomiting Anesthetic complications: no   No notable events documented.  Last Vitals:  Vitals:   06/04/24 1820 06/04/24 1901  BP: (!) 126/49 (!) 133/58  Pulse:  (!) 57  Resp: 18 20  Temp:    SpO2: 98% 91%    Last Pain:  Vitals:   06/04/24 1820  TempSrc:   PainSc: 4                  Thom JONELLE Peoples

## 2024-06-04 NOTE — Evaluation (Signed)
 Physical Therapy Evaluation Patient Details Name: Mary Meyers MRN: [id number] DOB: 1946/06/11 Today's Date: 06/04/2024  History of Present Illness  77yo female presents to therapy s/p L TKA on 06/04/2024 due to failure of conservative measures. Pt  PMH includes but is not limited to: PE, chronic dyspnea, HTN,  pelvic fx, vertigo and R TKA on 02/13/24.  Clinical Impression    Mary Meyers is a 78 y.o. female POD 0 s/p L TKA. Patient reports IND with mobility at baseline. Patient is now limited by functional impairments (see PT problem list below) and requires S for bed mobility and CGA and cues for transfers. Patient was able to ambulate 40 feet with RW and CGA level of assist. Noted L knee instability with hyperextension in stance phase. Pt reported feeling faint during the course of gait tasks, pt states came on suddenly. Bp at rest semi reclined prior to eval 140/74 Bp s/p pt seated in recliner with B LE elevated and back of recliner reclined 83/44 and s/p 5 min in recliner 113/62 nursing aware. PT to return later in the afternoon to re-assess for safety with same day d/c. Patient instructed in exercise to facilitate ROM and circulation to manage edema in supine. Patient will benefit from continued skilled PT interventions to address impairments and progress towards PLOF. Acute PT will follow to progress mobility and stair training in preparation for safe discharge home.       If plan is discharge home, recommend the following: A little help with walking and/or transfers;A little help with bathing/dressing/bathroom;Assistance with cooking/housework;Assist for transportation;Help with stairs or ramp for entrance   Can travel by private vehicle        Equipment Recommendations None recommended by PT  Recommendations for Other Services       Functional Status Assessment Patient has had a recent decline in their functional status and demonstrates the ability to make significant  improvements in function in a reasonable and predictable amount of time.     Precautions / Restrictions Precautions Precautions: Fall;Knee Restrictions Weight Bearing Restrictions Per Provider Order: No      Mobility  Bed Mobility Overal bed mobility: Needs Assistance Bed Mobility: Supine to Sit     Supine to sit: Supervision     General bed mobility comments: min cues and HOB slightly elevated    Transfers Overall transfer level: Needs assistance Equipment used: Rolling walker (2 wheels) Transfers: Sit to/from Stand Sit to Stand: Contact guard assist           General transfer comment: min cues    Ambulation/Gait Ambulation/Gait assistance: Contact guard assist Gait Distance (Feet): 40 Feet Assistive device: Rolling walker (2 wheels) Gait Pattern/deviations: Step-to pattern, Antalgic, Trunk flexed, Knee hyperextension - left, Decreased stance time - left Gait velocity: decreased     General Gait Details: min cues for safety, posture, RW management with L knee instabiltiy noted in stance phase with hyperextension, B UE support at RW to offload and cues for co-contraction for mm brace for improved stabiltiy  Stairs            Wheelchair Mobility     Tilt Bed    Modified Rankin (Stroke Patients Only)       Balance                                             Pertinent  Vitals/Pain Pain Assessment Pain Assessment: 0-10 Pain Score: 6  Pain Location: L knee and LE Pain Descriptors / Indicators: Aching, Constant, Discomfort, Dull, Grimacing, Operative site guarding Pain Intervention(s): Limited activity within patient's tolerance, Monitored during session, Premedicated before session, Repositioned, Ice applied    Home Living Family/patient expects to be discharged to:: Private residence Living Arrangements: Alone Available Help at Discharge: Family Type of Home: House Home Access: Stairs to enter   [employer] of  Steps: threshold   Home Layout: One level Home Equipment: Educational psychologist (2 wheels);Cane - single point      Prior Function Prior Level of Function : Independent/Modified Independent             Mobility Comments: IND no AD for all ADLs, self care tasks and IADLs       Extremity/Trunk Assessment        Lower Extremity Assessment Lower Extremity Assessment: LLE deficits/detail LLE Deficits / Details: ankle DF/PF 5/5 LLE Sensation: decreased light touch    Cervical / Trunk Assessment Cervical / Trunk Assessment: Normal  Communication   Communication Communication: No apparent difficulties    Cognition Arousal: Alert Behavior During Therapy: WFL for tasks assessed/performed   PT - Cognitive impairments: No apparent impairments                         Following commands: Intact       Cueing       General Comments      Exercises Total Joint Exercises Ankle Circles/Pumps: AROM, Both, 10 reps Quad Sets: AROM, Left, 5 reps Short Arc Quad: AROM, Left, 5 reps Heel Slides: AROM, Left, 5 reps Hip ABduction/ADduction: AROM, Left, 5 reps Straight Leg Raises: AROM, Left, 5 reps   Assessment/Plan    PT Assessment Patient needs continued PT services  PT Problem List Decreased strength;Decreased range of motion;Decreased activity tolerance;Decreased balance;Decreased mobility;Decreased coordination;Pain       PT Treatment Interventions DME instruction;Gait training;Stair training;Functional mobility training;Therapeutic activities;Therapeutic exercise;Balance training;Neuromuscular re-education;Patient/family education;Modalities    PT Goals (Current goals can be found in the Care Plan section)  Acute Rehab PT Goals Patient Stated Goal: travel to [place] to further study the language PT Goal Formulation: With patient Time For Goal Achievement: 06/18/24 Potential to Achieve Goals: Good    Frequency 7X/week     Co-evaluation                AM-PAC PT 6 Clicks Mobility  Outcome Measure Help needed turning from your back to your side while in a flat bed without using bedrails?: None Help needed moving from lying on your back to sitting on the side of a flat bed without using bedrails?: A Little Help needed moving to and from a bed to a chair (including a wheelchair)?: A Little Help needed standing up from a chair using your arms (e.g., wheelchair or bedside chair)?: A Little Help needed to walk in hospital room?: A Little Help needed climbing 3-5 steps with a railing? : Total 6 Click Score: 17    End of Session Equipment Utilized During Treatment: Gait belt Activity Tolerance: Treatment limited secondary to medical complications (Comment);Patient limited by pain (hypotension) Patient left: in chair;with call bell/phone within reach;with family/visitor present;with nursing/sitter in room Nurse Communication: Mobility status (pt not appropriate at this time for same day d/c) PT Visit Diagnosis: Unsteadiness on feet (R26.81);Other abnormalities of gait and mobility (R26.89);Muscle weakness (generalized) (M62.81);Difficulty in walking, not elsewhere classified (R26.2);Pain  Pain - Right/Left: Left Pain - part of body: Knee;Leg    Time: 8493-8451 PT Time Calculation (min) (ACUTE ONLY): 42 min   Charges:   PT Evaluation $PT Eval Low Complexity: 1 Low PT Treatments $Gait Training: 8-22 mins $Therapeutic Exercise: 8-22 mins PT General Charges $$ ACUTE PT VISIT: 1 Visit         [place], PT Acute Rehab   [place] VEAR Drone 06/04/2024, 4:00 PM

## 2024-06-04 NOTE — Anesthesia Procedure Notes (Signed)
 Spinal  Patient location during procedure: OR Start time: 06/04/2024 10:05 AM End time: 06/04/2024 10:10 AM Reason for block: surgical anesthesia Staffing Performed: anesthesiologist  Anesthesiologist: Erma Thom SAUNDERS, MD Performed by: Erma Thom SAUNDERS, MD Authorized by: Erma Thom SAUNDERS, MD   Preanesthetic Checklist Completed: patient identified, IV checked, site marked, risks and benefits discussed, surgical consent, monitors and equipment checked, pre-op evaluation and timeout performed Spinal Block Patient position: sitting Prep: DuraPrep Patient monitoring: heart rate, cardiac monitor, continuous pulse ox and blood pressure Approach: midline Location: L4-5 Needle Needle type: Pencan  Needle gauge: 24 G Assessment Sensory level: T6 Additional Notes Functioning IV was confirmed and monitors were applied. Sterile prep and drape, including hand hygiene and sterile gloves were used. The patient was positioned and the spine was prepped. The skin was anesthetized with lidocaine .  Free flow of clear CSF was obtained prior to injecting local anesthetic into the CSF.  The spinal needle aspirated freely following injection.  The needle was carefully withdrawn.  The patient tolerated the procedure well.

## 2024-06-04 NOTE — Transfer of Care (Signed)
 Immediate Anesthesia Transfer of Care Note  Patient: Mary Meyers  Procedure(s) Performed: ARTHROPLASTY, KNEE, TOTAL (Left: Knee)  Patient Location: PACU  Anesthesia Type:Spinal  Level of Consciousness: awake  Airway & Oxygen Therapy: Patient Spontanous Breathing and Patient connected to face mask oxygen  Post-op Assessment: Report given to RN and Post -op Vital signs reviewed and stable  Post vital signs: Reviewed and stable  Last Vitals:  Vitals Value Taken Time  BP 119/70 06/04/24 12:09  Temp    Pulse 52 06/04/24 12:12  Resp 17 06/04/24 12:12  SpO2 95 % 06/04/24 12:12  Vitals shown include unfiled device data.  Last Pain:  Vitals:   06/04/24 0852  TempSrc:   PainSc: 0-No pain         Complications: No notable events documented.

## 2024-06-04 NOTE — Op Note (Signed)
 PATIENT ID:      Mary Meyers  MRN:     [id number] DOB/AGE:    November 21, 1945 / 78 y.o.       OPERATIVE REPORT   DATE OF PROCEDURE:  06/04/2024      PREOPERATIVE DIAGNOSIS:   LEFT KNEE OSTEOARTHRITIS      Estimated body mass index is 34.01 kg/m as calculated from the following:   Height as of 05/24/24: 5' 3 (1.6 m).   Weight as of 05/24/24: 87.1 kg.                                                       POSTOPERATIVE DIAGNOSIS:   Same                                                                  PROCEDURE:  Procedure(s): ARTHROPLASTY, KNEE, TOTAL Using DepuyAttune RP implants #5L Femur, #5Tibia, 5 mm Attune RP bearing, 35 Patella    SURGEON: Dempsey JINNY Sensor  ASSISTANT:   Camellia POUR. [employer]   (Present and scrubbed throughout the case, critical for assistance with exposure, retraction, instrumentation, and closure.)        ANESTHESIA: Spinal, 20cc Exparel , 50cc 0.25% Marcaine  EBL: 300 cc FLUID REPLACEMENT: 1500 cc crystaloid TOURNIQUET: none DRAINS: None TRANEXAMIC ACID : 1gm IV, 2gm topical COMPLICATIONS:  None         INDICATIONS FOR PROCEDURE: The patient has  LEFT KNEE OSTEOARTHRITIS, Var deformities, XR shows bone on bone arthritis, lateral subluxation of tibia. Patient has failed all conservative measures including anti-inflammatory medicines, narcotics, attempts at exercise and weight loss, cortisone injections and viscosupplementation.  Risks and benefits of surgery have been discussed, questions answered.   DESCRIPTION OF PROCEDURE: The patient identified by armband, received  IV antibiotics, in the holding area at [facility]. Patient taken to the operating room, appropriate anesthetic monitors were attached, and Spinal anesthesia was  induced. IV Tranexamic acid  was given. Lateral post and 2 surefoot positioners applied to the table, the lower extremity was then prepped and draped in usual sterile fashion from the toes to the high thigh. Time-out procedure was  performed. Camellia POUR. [facility] PAC, was present and scrubbed throughout the case, critical for assistance with, positioning, exposure, retraction, instrumentation, and closure.The skin and subcutaneous tissue along the incision was injected with 20 cc of a mixture of 20cc Exparel  and 30cc Marcaine  50cc saline solution, using a 21-gauge by 1-1/2 inch needle. We began the operation, with the knee flexed 130 degrees, by making the anterior midline incision starting at handbreadth above the patella going over the patella 1 cm medial to and 4 cm distal to the tibial tubercle. Small bleeders in the skin and the subcutaneous tissue identified and cauterized. Transverse retinaculum was incised and reflected medially and a medial parapatellar arthrotomy was accomplished. the patella was everted and theprepatellar fat pad resected. The superficial medial collateral ligament was then elevated from anterior to posterior along the proximal flare of the tibia and anterior half of the menisci resected. The knee was hyperflexed exposing bone on bone arthritis. Peripheral and notch  osteophytes as well as the cruciate ligaments were then resected. We continued to work our way around posteriorly along the proximal tibia, and externally rotated the tibia subluxing it out from underneath the femur. A McHale PCL retractor was placed through the notch, a lateral Hohmann retractor, and anterolateral small homan retractor placed. We then entered the proximal tibia with the Depuy starter drill in line with the axis of the tibia followed by an intramedullary guide rod and 3 degree posterior slope cutting guide. The tibial cutting guide, was pinned into place allowing resection of 2 mm of bone medially and 8 mm of bone laterally. Satisfied with the tibial resection, we then entered the distal femur 2 mm anterior to the PCL origin with the starter drill, followed by the intramedullary guide rod and applied the distal femoral cutting guide set at 9  mm, with 5 degrees of valgus. This was pinned along the epicondylar axis. At this point, the distal femoral cut was accomplished without difficulty. We then sized for a #5L femoral component and pinned the chamfer guide in 3 degrees of external rotation. The anterior, posterior, and chamfer cuts were accomplished without difficulty followed by the Attune RP box cutting guide and the box cut. We also removed posterior osteophytes from the posterior femoral condyles. The posterior capsule was injected with Exparel  solution. The knee was brought into full extension. We checked our extension gap and fit a 5 mm trial lollipop. Distracting in extension with a lamina spreader,  bleeders in the posterior capsule, Posterior medial and posterior lateral gutter were cauterized.  The transexamic acid-soaked sponge was then placed in the gap of the knee in extension. The knee was flexed 30. The posterior patella cut was accomplished with the 9.5 mm Attune cutting guide, sized for a 35 mm dome, and the fixation pegs drilled.The knee was then once again hyperflexed exposing the proximal tibia. We sized for a # 5 tibial base plate, applied the smokestack and the conical reamer followed by the the Delta fin keel punch. We then hammered into place the Attune RP trial femoral component, drilled the lugs, inserted a  5 mm trial bearing, trial patellar button, and took the knee through range of motion from 0-130 degrees. Medial and lateral ligamentous stability was checked. No thumb pressure was required for patellar Tracking.  All trial components were removed, mating surfaces irrigated with pulse lavage, and dried with suction and sponges. Exparel  solution was applied to the cancellus bone of the patella distal femur and proximal tibia.  After waiting 30 seconds, the bony surfaces were again, dried with sponges. A double batch of DePuy HV cement was mixed and applied to all bony metallic mating surfaces except for the posterior  condyles of the femur itself. In order, we hammered into place the tibial tray and removed excess cement, the femoral component and removed excess cement. The final Attune RP bearing was inserted, and the knee brought to full extension with compression. The patellar button was clamped into place, and excess cement removed. The knee was held at 30 flexion with compression using the second surefoot, while the cement cured. The wound was irrigated out with normal saline solution pulse lavage. The rest of the Exparel  was injected into the parapatellar arthrotomy, subcutaneous tissues, and periosteal tissues. The parapatellar arthrotomy was closed with running #1 Vicryl suture. The subcutaneous tissue with 3-0 undyed Vicryl suture, and the skin with running 3-0 SQ vicryl. An Aquacil dressing and Ace wrap were applied. The patient was  taken to recovery room without difficulty.   Dempsey JINNY Sensor 06/04/2024, 8:49 AM

## 2024-06-05 ENCOUNTER — Encounter (HOSPITAL_COMMUNITY): Payer: Self-pay | Admitting: Orthopedic Surgery

## 2024-06-05 ENCOUNTER — Other Ambulatory Visit (HOSPITAL_COMMUNITY): Payer: Self-pay

## 2024-06-05 ENCOUNTER — Telehealth (HOSPITAL_COMMUNITY): Payer: Self-pay | Admitting: Pharmacy Technician

## 2024-06-05 DIAGNOSIS — M1712 Unilateral primary osteoarthritis, left knee: Secondary | ICD-10-CM | POA: Diagnosis not present

## 2024-06-05 LAB — GLUCOSE, CAPILLARY
Glucose-Capillary: 179 mg/dL — ABNORMAL HIGH (ref 70–99)
Glucose-Capillary: 106 mg/dL — ABNORMAL HIGH (ref 70–99)
Glucose-Capillary: 133 mg/dL — ABNORMAL HIGH (ref 70–99)
Glucose-Capillary: 159 mg/dL — ABNORMAL HIGH (ref 70–99)

## 2024-06-05 MED ORDER — BUDESON-GLYCOPYRROL-FORMOTEROL 160-9-4.8 MCG/ACT IN AERO
2.0000 | INHALATION_SPRAY | Freq: Two times a day (BID) | RESPIRATORY_TRACT | Status: DC
  Filled 2024-06-05: qty 5.9

## 2024-06-05 NOTE — Care Management Obs Status (Signed)
 MEDICARE OBSERVATION STATUS NOTIFICATION   Patient Details  Name: Mary Meyers MRN: [id number] Date of Birth: May 30, 1946   Medicare Observation Status Notification Given:  Chaney NORMAN ASPEN, LCSW 06/05/2024, 2:05 PM

## 2024-06-05 NOTE — Progress Notes (Signed)
 PATIENT ID: Mary Meyers  MRN: [id number]  DOB/AGE:  78-12-1945 / 78 y.o.  1 Day Post-Op Procedure(s) (LRB): ARTHROPLASTY, KNEE, TOTAL (Left)    PROGRESS NOTE Subjective: Patient is alert, oriented, no Nausea, no Vomiting, yes passing gas. Taking PO well. Denies SOB, Chest or Calf Pain. Using Incentive Spirometer, PAS in place. Ambulate 5', Yesterday's total administered Morphine Milligram Equivalents: 51), Patient reports pain as 2/10 .    Objective: Vital signs in last 24 hours: Vitals:   06/04/24 1901 06/04/24 2103 06/05/24 0302 06/05/24 0558  BP: (!) 133/58 136/64 119/69 (!) 111/59  Pulse: (!) 57 (!) 59 61 63  Resp: 20 18 16 16   Temp:  98 F (36.7 C) 98.7 F (37.1 C) 99.5 F (37.5 C)  TempSrc:  Oral Oral   SpO2: 91% 93% 98% 92%  Weight:      Height:          Intake/Output from previous day: I/O last 3 completed shifts: In: 2598.2 [P.O.:480; I.V.:1918.2; IV Piggyback:200] Out: 200 [Urine:100; Blood:100]   Intake/Output this shift: No intake/output data recorded.   LABORATORY DATA: Recent Labs    06/04/24 0810 06/04/24 1214 06/04/24 2106  GLUCAP 112* 105* 131*    Examination: Neurologically intact ABD soft Neurovascular intact Sensation intact distally Intact pulses distally Dorsiflexion/Plantar flexion intact Incision: dressing C/D/I No cellulitis present Compartment soft}  Assessment:   1 Day Post-Op Procedure(s) (LRB): ARTHROPLASTY, KNEE, TOTAL (Left) ADDITIONAL DIAGNOSIS: Pulmonary hypertension, history of DVT, GERD.  Patient's anticipated LOS is less than 2 midnights, meeting these requirements: - Younger than 48 - Lives within 1 hour of care - Has a competent adult at home to recover with post-op recover - NO history of  - Chronic pain requiring opiods  - Diabetes  - Coronary Artery Disease  - Heart failure  - Heart attack  - Stroke  - DVT/VTE  - Cardiac arrhythmia  - Respiratory Failure/COPD  - Renal failure  - Anemia  -  Advanced Liver disease     Plan: PT/OT WBAT, AROM and PROM  DVT Prophylaxis:  SCDx72hrs, apixaban  2.5 mg p.o. twice daily for 2 weeks and then resume normal 5 mg dosing.  DISCHARGE PLAN: Home, today if she passes physical therapy DISCHARGE NEEDS: HHPT, Walker, and 3-in-1 comode seat     Mary Meyers 06/05/2024, 7:52 AM Patient ID: Mary Meyers, female   DOB: 1946-07-20, 78 y.o.   MRN: [id number]

## 2024-06-05 NOTE — Plan of Care (Signed)

## 2024-06-05 NOTE — Progress Notes (Signed)
 Physical Therapy Treatment Patient Details Name: Mary Meyers MRN: [id number] DOB: January 02, 1946 Today's Date: 06/05/2024   History of Present Illness 78yo female presents to therapy s/p L TKA on 06/04/2024 due to failure of conservative measures. Pt  PMH includes but is not limited to: PE, chronic dyspnea, HTN,  pelvic fx, vertigo and R TKA on 02/13/24.    PT Comments  Pt progressing, amb ~ 10' however limited by pain. Will see how pt progresses this pm however pt concerned about d/c today. RN aware.   If plan is discharge home, recommend the following: A little help with walking and/or transfers;A little help with bathing/dressing/bathroom;Assistance with cooking/housework;Assist for transportation;Help with stairs or ramp for entrance   Can travel by private vehicle        Equipment Recommendations  None recommended by PT    Recommendations for Other Services       Precautions / Restrictions Precautions Precautions: Fall;Knee Restrictions Weight Bearing Restrictions Per Provider Order: No     Mobility  Bed Mobility Overal bed mobility: Needs Assistance Bed Mobility: Supine to Sit     Supine to sit: Min assist     General bed mobility comments: assist with LLE    Transfers Overall transfer level: Needs assistance   Transfers: Sit to/from Stand Sit to Stand: Min assist           General transfer comment: cues for hand placement and LLE position; STS from toilet and bed    Ambulation/Gait Ambulation/Gait assistance: Contact guard assist, Min assist Gait Distance (Feet): 10 Feet (x2) Assistive device: Rolling walker (2 wheels) Gait Pattern/deviations: Step-to pattern, Decreased stance time - left, Decreased step length - right, Decreased step length - left Gait velocity: decreased     General Gait Details: cues for sequence, RW position, posture   Stairs             Wheelchair Mobility     Tilt Bed    Modified Rankin (Stroke Patients  Only)       Balance                                            Communication    Cognition Arousal: Alert Behavior During Therapy: WFL for tasks assessed/performed   PT - Cognitive impairments: No apparent impairments                                Cueing    Exercises Total Joint Exercises Ankle Circles/Pumps: AROM, Both, 10 reps Heel Slides: AAROM, Left, 10 reps Hip ABduction/ADduction: AROM, Left, 5 reps    General Comments        Pertinent Vitals/Pain Pain Assessment Pain Assessment: 0-10 Pain Score: 7  Pain Location: L knee and LE Pain Descriptors / Indicators: Grimacing, Guarding, Sore Pain Intervention(s): Limited activity within patient's tolerance, Monitored during session, Premedicated before session    Home Living                          Prior Function            PT Goals (current goals can now be found in the care plan section) Acute Rehab PT Goals PT Goal Formulation: With patient Time For Goal Achievement: 06/18/24 Potential to Achieve Goals: Good Progress towards PT goals:  Progressing toward goals    Frequency    7X/week      PT Plan      Co-evaluation              AM-PAC PT 6 Clicks Mobility   Outcome Measure  Help needed turning from your back to your side while in a flat bed without using bedrails?: None Help needed moving from lying on your back to sitting on the side of a flat bed without using bedrails?: A Little Help needed moving to and from a bed to a chair (including a wheelchair)?: A Little Help needed standing up from a chair using your arms (e.g., wheelchair or bedside chair)?: A Little Help needed to walk in hospital room?: A Little Help needed climbing 3-5 steps with a railing? : A Little 6 Click Score: 19    End of Session Equipment Utilized During Treatment: Gait belt Activity Tolerance: Patient limited by pain;Patient tolerated treatment well Patient left: with  call bell/phone within reach;with family/visitor present;in chair;with chair alarm set Nurse Communication: Mobility status PT Visit Diagnosis: Unsteadiness on feet (R26.81);Other abnormalities of gait and mobility (R26.89);Muscle weakness (generalized) (M62.81);Difficulty in walking, not elsewhere classified (R26.2);Pain Pain - Right/Left: Left Pain - part of body: Knee;Leg     Time: 8885-8853 PT Time Calculation (min) (ACUTE ONLY): 32 min  Charges:    $Gait Training: 8-22 mins $Therapeutic Activity: 8-22 mins PT General Charges $$ ACUTE PT VISIT: 1 Visit                     Bethel Gaglio, PT  Acute Rehab Dept [facility]) [phone]  06/05/2024    [facility] 06/05/2024, 1:54 PM

## 2024-06-05 NOTE — Plan of Care (Signed)
   Problem: Education: Goal: Knowledge of General Education information will improve Description: Including pain rating scale, medication(s)/side effects and non-pharmacologic comfort measures Outcome: Progressing   Problem: Activity: Goal: Risk for activity intolerance will decrease Outcome: Progressing   Problem: Nutrition: Goal: Adequate nutrition will be maintained Outcome: Progressing

## 2024-06-05 NOTE — Progress Notes (Addendum)
 Physical Therapy Treatment Patient Details Name: Mary Meyers MRN: [id number] DOB: [birth year] Today's Date: 06/05/2024   History of Present Illness 77yo female presents to therapy s/p L TKA on 06/04/2024 due to failure of conservative measures. Pt  PMH includes but is not limited to: PE, chronic dyspnea, HTN,  pelvic fx, vertigo and R TKA on 02/13/24.    PT Comments   Pt making steady progress, transition OOB and STS require incr time and assist with LLE, effortful and painful. Pt reported pain improved briefly with ambulation, incr EOS and RN notified. Pt was able to amb 70' with RW and min assist. SpO2=87-95% on RA, O2 replaced at 1L EOS. Discussed with pt and family that she may benefit from another night in hospital to work on pain control and incr independence with mobility for safe d/c home.   If plan is discharge home, recommend the following: A little help with walking and/or transfers;A little help with bathing/dressing/bathroom;Assistance with cooking/housework;Assist for transportation;Help with stairs or ramp for entrance   Can travel by private vehicle        Equipment Recommendations  None recommended by PT    Recommendations for Other Services       Precautions / Restrictions Precautions Precautions: Fall;Knee Restrictions Weight Bearing Restrictions Per Provider Order: No     Mobility  Bed Mobility Overal bed mobility: Needs Assistance Bed Mobility: Supine to Sit, Sit to Supine     Supine to sit: Min assist Sit to supine: Contact guard assist, Min assist   General bed mobility comments: assist with LLE, incr time and effort    Transfers Overall transfer level: Needs assistance Equipment used: Rolling walker (2 wheels) Transfers: Sit to/from Stand Sit to Stand: Min assist           General transfer comment: cues for hand placement and LLE position;    Ambulation/Gait Ambulation/Gait assistance: Contact guard assist, Min assist Gait Distance  (Feet): 70 Feet Assistive device: Rolling walker (2 wheels) Gait Pattern/deviations: Step-to pattern, Decreased stance time - left, Decreased step length - right, Decreased step length - left Gait velocity: decreased     General Gait Details: cues for sequence, RW position, posture. increased time needed, effortful, no dizziness   Stairs             Wheelchair Mobility     Tilt Bed    Modified Rankin (Stroke Patients Only)       Balance                                            Communication    Cognition Arousal: Alert Behavior During Therapy: WFL for tasks assessed/performed   PT - Cognitive impairments: No apparent impairments                                Cueing    Exercises Total Joint Exercises Ankle Circles/Pumps: AROM, Both, 10 reps   General Comments        Pertinent Vitals/Pain Pain Assessment Pain Assessment: 0-10 Pain Score: 7  Pain Location: L knee and LE Pain Descriptors / Indicators: Grimacing, Guarding, Sore Pain Intervention(s): Limited activity within patient's tolerance, Monitored during session, Premedicated before session, Repositioned    Home Living  Prior Function            PT Goals (current goals can now be found in the care plan section) Acute Rehab PT Goals PT Goal Formulation: With patient Time For Goal Achievement: 06/18/24 Potential to Achieve Goals: Good Progress towards PT goals: Progressing toward goals    Frequency    7X/week      PT Plan      Co-evaluation              AM-PAC PT 6 Clicks Mobility   Outcome Measure  Help needed turning from your back to your side while in a flat bed without using bedrails?: None Help needed moving from lying on your back to sitting on the side of a flat bed without using bedrails?: A Little Help needed moving to and from a bed to a chair (including a wheelchair)?: A Little Help needed  standing up from a chair using your arms (e.g., wheelchair or bedside chair)?: A Little Help needed to walk in hospital room?: A Little Help needed climbing 3-5 steps with a railing? : A Little 6 Click Score: 19    End of Session Equipment Utilized During Treatment: Gait belt Activity Tolerance: Patient limited by pain;Patient tolerated treatment well Patient left: with call bell/phone within reach;with family/visitor present;in bed;with bed alarm set Nurse Communication: Mobility status PT Visit Diagnosis: Unsteadiness on feet (R26.81);Other abnormalities of gait and mobility (R26.89);Muscle weakness (generalized) (M62.81);Difficulty in walking, not elsewhere classified (R26.2);Pain Pain - Right/Left: Left Pain - part of body: Knee;Leg     Time: 8591-8550 PT Time Calculation (min) (ACUTE ONLY): 41 min  Charges:    $Gait Training: 23-37 mins $Therapeutic Activity: 8-22 mins PT General Charges $$ ACUTE PT VISIT: 1 Visit                     Jahnessa Vanduyn, PT  Acute Rehab Dept [facility]) [phone]  06/05/2024    [facility] 06/05/2024, 2:54 PM

## 2024-06-05 NOTE — Telephone Encounter (Signed)
 Patient Product/process development scientist completed.    The patient is insured through [facility]. Patient has Medicare and is not eligible for a copay card, but may be able to apply for patient assistance or Medicare RX Payment Plan (Patient Must reach out to their plan, if eligible for payment plan), if available.    Ran test claim for Eliquis  5 mg and the current 30 day co-pay is $0.00.   This test claim was processed through [facility]- copay amounts may vary at other pharmacies due to pharmacy/plan contracts, or as the patient moves through the different stages of their insurance plan.     Reyes Sharps, CPHT Pharmacy Technician III Certified Patient Advocate [facility] Patient Advocate Team Direct Number: [phone]  Fax: [phone]

## 2024-06-05 NOTE — TOC Transition Note (Signed)
 Transition of Care [facility]) - Discharge Note   Patient Details  Name: Mary Meyers MRN: [id number] Date of Birth: 12-21-1945  Transition of Care [facility]) CM/SW Contact:  NORMAN ASPEN, LCSW Phone Number: 06/05/2024, 11:28 AM   Clinical Narrative:     Met with pt and family who confirm she has all needed DME at home.  Orders placed for HHPT and pt prefers agency who followed her after prior surgery in [facility]).  Order placed/ accepted with [facility].  No further TOC needs.  Final next level of care: Home w Home Health Services Barriers to Discharge: No Barriers Identified   Patient Goals and CMS Choice Patient states their goals for this hospitalization and ongoing recovery are:: return home          Discharge Placement                       Discharge Plan and Services Additional resources added to the After Visit Summary for                  DME Arranged: N/A DME Agency: NA       HH Arranged: PT HH Agency: [facility] Date [facility] Agency Contacted: 06/05/24 Time HH Agency Contacted: 1127 Representative spoke with at [facility] Agency: [place]  Social Drivers of Health (SDOH) Interventions SDOH Screenings   Food Insecurity: No Food Insecurity (06/04/2024)  Housing: Low Risk  (06/04/2024)  Transportation Needs: No Transportation Needs (06/04/2024)  Utilities: Not At Risk (06/04/2024)  Financial Resource Strain: Low Risk  (10/11/2023)   Received from [facility]  Social Connections: Unknown (06/04/2024)  Tobacco Use: Medium Risk (06/04/2024)     Readmission Risk Interventions     No data to display

## 2024-06-06 DIAGNOSIS — M1712 Unilateral primary osteoarthritis, left knee: Secondary | ICD-10-CM | POA: Diagnosis not present

## 2024-06-06 LAB — GLUCOSE, CAPILLARY
Glucose-Capillary: 133 mg/dL — ABNORMAL HIGH (ref 70–99)
Glucose-Capillary: 145 mg/dL — ABNORMAL HIGH (ref 70–99)
Glucose-Capillary: 98 mg/dL (ref 70–99)

## 2024-06-06 NOTE — Progress Notes (Addendum)
 Pt IV removed. Tolerated well. Pt OOB in chair. Son at bedside/ pt and pt son ready for discharge. All questions asked and answered. Verbalized understanding of discharge. Assisted pt with dressing self and transferred to wheelchair with 2 assist. Pt discharged off unit in wheelchair. 2 assist into car. Pt tolerated well.

## 2024-06-06 NOTE — Progress Notes (Signed)
 Contacted RT for albuterol  treatment for wheezing. Spoke via phone and en route.
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
## 2024-06-06 NOTE — Progress Notes (Addendum)
 Per PT- she does not have Trelegy with  her here at the hospital. This drug is on RT Worklist but not MAR. PT not given this med at this time. PT states she is being discharged today. Breztri  was discontinued on MAR before RT visit.

## 2024-06-06 NOTE — Progress Notes (Signed)
 PT walking in hall with Physical Therapy when RT attempted to deliver requested treatment. Unable to reach RN by phone- left verbal message with RN at station that this RT will return when able.

## 2024-06-06 NOTE — Progress Notes (Signed)
 Discharge instructions given to patient and son questions asked and answered D Johnie RN
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
## 2024-06-06 NOTE — Progress Notes (Signed)
 PATIENT ID: Tyjanae Bartek  MRN: [id number]  DOB/AGE:  [birth year] / 78 y.o.  2 Days Post-Op Procedure(s) (LRB): ARTHROPLASTY, KNEE, TOTAL (Left)    PROGRESS NOTE Subjective: Patient is alert, oriented, no Nausea, no Vomiting, yes passing gas. Taking PO well. Denies SOB, Chest or Calf Pain. Using Incentive Spirometer, PAS in place. Ambulate WBAT with pt walking 70 ft, Yesterday's total administered Morphine Milligram Equivalents: 80), Patient reports pain as 10/10 with activity.    Objective: Vital signs in last 24 hours: Vitals:   06/05/24 1311 06/05/24 1312 06/05/24 1946 06/06/24 0604  BP: (!) 123/105 (!) 124/59 109/78 (!) 163/71  Pulse: 95 (!) 41 63 64  Resp:  18 16 18   Temp:  98.1 F (36.7 C) 98.2 F (36.8 C) 98.1 F (36.7 C)  TempSrc:   Oral   SpO2: (!) 82% 91% 93% 91%  Weight:      Height:          Intake/Output from previous day: I/O last 3 completed shifts: In: 4101.2 [P.O.:1200; I.V.:2901.2] Out: 0    Intake/Output this shift: No intake/output data recorded.   LABORATORY DATA: Recent Labs    06/05/24 2116 06/06/24 0721 06/06/24 0726  GLUCAP 159* 133* 145*    Examination: Neurologically intact Neurovascular intact Sensation intact distally Intact pulses distally Dorsiflexion/Plantar flexion intact Incision: dressing C/D/I and no drainage No cellulitis present Compartment soft}  Assessment:   2 Days Post-Op Procedure(s) (LRB): ARTHROPLASTY, KNEE, TOTAL (Left) ADDITIONAL DIAGNOSIS: Expected Acute Blood Loss Anemia, Pulmonary hypertension, history of DVT, GERD.   Plan: PT/OT WBAT, AROM and PROM  DVT Prophylaxis:  SCDx72hrs,  apixaban  2.5 mg p.o. twice daily for 2 weeks and then resume normal 5 mg dosing.  DISCHARGE PLAN: Home, when pt passes therapy DISCHARGE NEEDS: HHPT, Walker, and 3-in-1 comode seat     Camellia Ellen 06/06/2024, 8:23 AM

## 2024-06-19 ENCOUNTER — Ambulatory Visit

## 2024-06-21 ENCOUNTER — Encounter

## 2024-06-25 ENCOUNTER — Ambulatory Visit: Attending: Orthopedic Surgery

## 2024-06-25 ENCOUNTER — Ambulatory Visit

## 2024-06-25 DIAGNOSIS — R2689 Other abnormalities of gait and mobility: Secondary | ICD-10-CM | POA: Insufficient documentation

## 2024-06-25 DIAGNOSIS — M25662 Stiffness of left knee, not elsewhere classified: Secondary | ICD-10-CM | POA: Insufficient documentation

## 2024-06-25 DIAGNOSIS — M6281 Muscle weakness (generalized): Secondary | ICD-10-CM | POA: Insufficient documentation

## 2024-06-25 NOTE — Addendum Note (Signed)
 Addended by: KRISTA LONNI PARAS on: 06/25/2024 04:47 PM   Modules accepted: Orders
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
## 2024-06-27 ENCOUNTER — Ambulatory Visit

## 2024-06-27 DIAGNOSIS — M25662 Stiffness of left knee, not elsewhere classified: Secondary | ICD-10-CM | POA: Diagnosis not present

## 2024-06-27 DIAGNOSIS — R2689 Other abnormalities of gait and mobility: Secondary | ICD-10-CM

## 2024-06-27 DIAGNOSIS — M6281 Muscle weakness (generalized): Secondary | ICD-10-CM
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
## 2024-06-28 ENCOUNTER — Ambulatory Visit: Admitting: Pulmonary Disease

## 2024-06-28 ENCOUNTER — Other Ambulatory Visit (HOSPITAL_BASED_OUTPATIENT_CLINIC_OR_DEPARTMENT_OTHER): Payer: Self-pay | Admitting: Family Medicine

## 2024-06-28 ENCOUNTER — Ambulatory Visit (HOSPITAL_BASED_OUTPATIENT_CLINIC_OR_DEPARTMENT_OTHER)
Admission: RE | Admit: 2024-06-28 | Discharge: 2024-06-28 | Disposition: A | Discharge status: Home / Self Care | Source: Ambulatory Visit | Attending: Family Medicine | Admitting: Family Medicine

## 2024-06-28 ENCOUNTER — Encounter: Payer: Self-pay | Admitting: Pulmonary Disease

## 2024-06-28 VITALS — BP 146/94 | HR 66 | Temp 97.8°F | Ht 63.0 in | Wt 198.2 lb

## 2024-06-28 DIAGNOSIS — M79605 Pain in left leg: Secondary | ICD-10-CM

## 2024-06-28 DIAGNOSIS — T8141XA Infection following a procedure, superficial incisional surgical site, initial encounter: Secondary | ICD-10-CM

## 2024-06-28 DIAGNOSIS — Z87891 Personal history of nicotine dependence: Secondary | ICD-10-CM

## 2024-06-28 DIAGNOSIS — J45909 Unspecified asthma, uncomplicated: Secondary | ICD-10-CM

## 2024-06-28 DIAGNOSIS — J4540 Moderate persistent asthma, uncomplicated: Secondary | ICD-10-CM

## 2024-06-28 DIAGNOSIS — R0602 Shortness of breath: Secondary | ICD-10-CM

## 2024-06-28 DIAGNOSIS — G4733 Obstructive sleep apnea (adult) (pediatric): Secondary | ICD-10-CM | POA: Diagnosis not present

## 2024-06-28 DIAGNOSIS — R0609 Other forms of dyspnea: Secondary | ICD-10-CM

## 2024-06-28 DIAGNOSIS — E66812 Obesity, class 2: Secondary | ICD-10-CM

## 2024-06-28 LAB — NITRIC OXIDE: Nitric Oxide: 20

## 2024-06-28 NOTE — Patient Instructions (Addendum)
 VISIT SUMMARY:  You had a follow-up appointment today to discuss your asthma, obstructive sleep apnea, and recent postoperative issues. Your asthma is well-controlled, and you are using your CPAP machine consistently for sleep apnea. We also discussed your exertional shortness of breath and a postoperative wound that is causing you some discomfort.  YOUR PLAN:  -ASTHMA: Your asthma is well-controlled with your current medication, Trelegy. Continue taking Trelegy daily and use your rescue inhaler as needed for shortness of breath, up to three or four times a day.  -EXERTIONAL SHORTNESS OF BREATH: Your shortness of breath when exerting yourself is likely due to deconditioning after your recent surgery. We will start you on a pulmonary rehabilitation program once you have recovered from your current issues.  -OBSTRUCTIVE SLEEP APNEA: Obstructive sleep apnea is a condition where your breathing stops and starts during sleep. You are managing it well with your CPAP machine set at a pressure of 11 cm H2O. Continue using your CPAP machine as you have been.  -POSTOPERATIVE WOUND INFLAMMATION: You have a warm, swollen, and painful area at your surgical site, which may indicate an infection or inflammation. Please contact your orthopedic group to evaluate the wound site for a possible infection.  INSTRUCTIONS:  Please contact your orthopedic group to evaluate your postoperative wound for a possible infection. Continue using your CPAP machine and taking your asthma medication as directed. We will start you on a pulmonary rehabilitation program once you have recovered from your current issues.   RESUMEN DE LA VISITA:  Hoy tuvo una cita de seguimiento para hablar sobre su asma, apnea obstructiva del sueo y problemas posoperatorios recientes. Su asma est bien controlada y est usando su mquina CPAP de forma regular para la apnea del sueo. Tambin hablamos sobre su dificultad para respirar al hacer  ejercicio y ignacia herida posoperatoria que le causa algunas molestias.  SU PLAN:  -ASMA: Su asma est bien controlada con su medicacin actual, Trelegy. Contine tomando Trelegy a diario y use su inhalador de rescate segn sea necesario para la dificultad para respirar, Teacher, adult education tres o cuatro veces al [employer].  -DIFICULTAD PARA RESPIRAR AL HACER ESFUERZO: Su dificultad para respirar al hacer ejercicio probablemente se deba a un mal estado fsico despus de su ciruga reciente. Iniciaremos un programa de rehabilitacin pulmonar una vez que se haya recuperado de sus problemas actuales.  -APNEA OBSTRUCTIVA DEL SUEO: La apnea obstructiva del sueo es una afeccin en la que la respiracin se detiene y se reanuda durante el sueo. Lo est manejando bien con su mquina de CPAP ajustada a una presin de 11 cm H?O. Contine usando su mquina de CPAP como hasta ahora.  INFLAMACIN DE LA HERIDA POSTOPERATORIA: Tiene una zona caliente, hinchada y dolorosa en la herida quirrgica, lo que podra indicar una infeccin o inflamacin. Comunquese con su equipo de ortopedia para evaluar la herida y Engineer, manufacturing una posible infeccin.  INSTRUCCIONES:  Por favor, contacte a su equipo de ortopedia para evaluar su herida postoperatoria y detectar una posible infeccin. Contine usando su mquina CPAP y tomando su medicacin para el asma segn las indicaciones. Iniciaremos un programa de rehabilitacin pulmonar una vez que se haya recuperado de sus problemas actuales.

## 2024-06-28 NOTE — Progress Notes (Signed)
 Subjective:    Patient ID: Mary Meyers, female    DOB: 1945-12-21, 78 y.o.   MRN: [id number]  Patient Care Team: Glover Lenis, MD as PCP - General (Family Medicine) Anner Lenis ORN, MD as PCP - Cardiology (Cardiology)  Chief Complaint  Patient presents with   Asthma    Shortness of breath on exertion.    Obstructive Sleep Apnea    BACKGROUND/INTERVAL:Patient is a 78 year old remote former smoker with minimal tobacco exposure in the past, who presents for follow-up on the issue of shortness of breath.  PMHx significant for HTN, pulmonary hypertension, GERD, hyperlipidemia, endometrial cancer, history of pulmonary embolism.  She was last seen by me on 26 April 2024.  She is on CPAP for moderate obstructive sleep apnea.  Today she presents for scheduled follow-up.  She had total right knee replacement in late April and total left knee replacement in August.   HPI Discussed the use of AI scribe software for clinical note transcription with the patient, who gave verbal consent to proceed.  History of Present Illness   Mary Meyers is a 78 year old female with asthma and obstructive sleep apnea who presents for follow-up. She is accompanied by her son.  Her asthma is well controlled with daily use of Trelegy and she rarely uses her rescue inhaler. She experiences exertional dyspnea, such as when walking to the appointment.  For her obstructive sleep apnea, she sleeps well and consistently uses her CPAP machine, which is set to a pressure of eleven. She had a temporary discontinuation during a recent hospital stay but resumed use at home. She maintains the machine by cleaning it regularly.  She has a history of anemia following a surgical procedure approximately three and a half weeks ago, with less blood loss compared to a previous surgery. She describes localized pain and swelling in the incision area of the left knee, associated with warmth and mild induration.  She has  not been very compliant with her CPAP due to her recent knee surgery and difficulty sleeping after the procedure.  Compliance download shows 70% usage dates total with 20% of the days over 4 hours.  Again her compliance has been limited due to surgical procedure.    DATA 04/15/2022 chest x-ray PA and lateral: Prominent right hilar region, unchanged, otherwise no active cardiopulmonary disease. 05/25/2022 echocardiogram: LVEF 60 to 65%, mild LVH, rate 1 DD, RV size moderately enlarged, trivial aortic valve regurgitation, dilated inferior vena cava suggesting right atrial pressure of 15 mgHg. 11/19/2022 PFTs: FEV1 1.91 L or 101% predicted, FVC 2.54 L or 100% predicted, FEV1/FVC 75%, volumes normal.  Diffusion capacity normal.  No significant bronchodilator response.  Minimal if any, obstruction but for the most part study is normal.  Study does not explain the patient's dyspnea. 11/28/2022 split-night sleep study: Moderate obstructive sleep apnea with AHI of 22 and SpO2 low of 78%.  She did well with CPAP at 11 cm H2O.  She was fitted with a medium large Fisher-Paykel nasal pillow provided a mask and heated humidity, chinstrap was provided. 12/16/2022 right heart cath: Hemodynamic findings consistent with mild pulmonary hypertension.  Measured numbers do not correlate with estimated numbers on 2D echo. 06/17/2023 chest x-ray portable: Hyperinflation with no acute disease. 06/17/2023 VQ scan: Low probability for PE.  Review of Systems A 10 point review of systems was performed and it is as noted above otherwise negative.   Patient Active Problem List   Diagnosis Date Noted  S/P total knee arthroplasty, left 06/04/2024   Degenerative arthritis of left knee 06/01/2024   S/P total knee arthroplasty, right 02/13/2024   Osteoarthritis of right knee 02/08/2024   Pulmonary hypertension (HCC) 12/16/2022   Chronic dyspnea 11/05/2022   PMB (postmenopausal bleeding) 03/18/2021   B12 deficiency 11/21/2020    Elevated glucose 11/21/2020   Vertigo 04/09/2019   History of pulmonary embolism 01/20/2018   Endometrial cancer (HCC) 12/13/2017   S/P TAH-BSO 11/14/2017   Hypertension 11/01/2017   Bradycardia 10/03/2017   Osteoarthritis of knee 02/07/2017   Tremor 10/29/2016   Arthritis 05/17/2016   Depression 05/17/2016   Gastroesophageal reflux disease without esophagitis 05/17/2016   Acquired hypothyroidism 09/22/2015   Hyperlipidemia 09/22/2015   Cough 03/27/2013   Abnormal chest CT 03/27/2013    Social History   Tobacco Use   Smoking status: Former    Current packs/day: 0.00    Types: Cigarettes    Quit date: 10/19/1971    Years since quitting: 52.7    Passive exposure: Never   Smokeless tobacco: Never  Substance Use Topics   Alcohol use: Not Currently    Allergies  Allergen Reactions   Cortisone Hives and Other (See Comments)    Burning/swelling.   Iodinated Contrast Media Other (See Comments)    Burning/swelling 11/15/22 - contrasted cardiac CTA w/ 13 hr prep , called back on 11/16/22 with itching, hives, swelling to face. Instructed patient to never have contrast ever again due to breakthru reaction   Prednisone  Hives   Oxycodone  Rash   Tramadol Nausea And Vomiting and Other (See Comments)    dizziness    Current Meds  Medication Sig   albuterol  (VENTOLIN  HFA) 108 (90 Base) MCG/ACT inhaler Inhale 2 puffs into the lungs every 6 (six) hours as needed.   apixaban  (ELIQUIS ) 2.5 MG TABS tablet Take 1 tablet (2.5 mg total) by mouth 2 (two) times daily.   apixaban  (ELIQUIS ) 5 MG TABS tablet Take 5 mg by mouth See admin instructions. Take 5 mg twice daily only when traveling by plane more than 5 hours   Apple Cider Vinegar 250 MG CHEW Chew 500 mg by mouth daily.   Ascorbic Acid (VITAMIN C PO) Take 2 capsules by mouth daily.   atorvastatin  (LIPITOR) 20 MG tablet Take 20 mg by mouth daily.   buPROPion  (WELLBUTRIN  XL) 150 MG 24 hr tablet Take 150 mg by mouth daily.   cetirizine  (ZYRTEC) 10 MG tablet Take 10 mg by mouth daily.   cholecalciferol (VITAMIN D3) 25 MCG (1000 UNIT) tablet Take 2,000 Units by mouth daily.   EPINEPHrine  (EPIPEN  2-PAK) 0.3 mg/0.3 mL IJ SOAJ injection Inject 0.3 mg into the muscle as needed.   famotidine  (PEPCID ) 20 MG tablet Take 20 mg by mouth daily as needed for heartburn.   Fluticasone -Umeclidin-Vilant (TRELEGY ELLIPTA ) 200-62.5-25 MCG/ACT AEPB Inhale 1 puff into the lungs daily.   furosemide  (LASIX ) 20 MG tablet Take 20 mg by mouth as needed for edema.   levothyroxine  (SYNTHROID ) 100 MCG tablet Take 100 mcg by mouth daily before breakfast.   meclizine  (ANTIVERT ) 25 MG tablet Take 50 mg by mouth 3 (three) times daily as needed (for vertigo).   Multiple Vitamins-Minerals (HAIR SKIN AND NAILS FORMULA) TABS Take 2 tablets by mouth daily.   Multiple Vitamins-Minerals (IMMUNE SUPPORT PO) Take 2 tablets by mouth daily.   OZEMPIC, 0.25 OR 0.5 MG/DOSE, 2 MG/3ML SOPN Inject 0.25 mg as directed once a week.   pantoprazole  (PROTONIX ) 40 MG tablet Take 40  mg by mouth daily.   potassium chloride  (KLOR-CON ) 10 MEQ tablet Take 10 mEq by mouth daily.   tiZANidine  (ZANAFLEX ) 2 MG tablet Take 1 tablet (2 mg total) by mouth every 6 (six) hours as needed for muscle spasms.   triamterene -hydrochlorothiazide  (MAXZIDE ) 75-50 MG tablet Take 1 tablet by mouth daily.   TURMERIC-GINGER PO Take 2 tablets by mouth daily.   venlafaxine  XR (EFFEXOR -XR) 150 MG 24 hr capsule Take 150 mg by mouth daily.    Immunization History  Administered Date(s) Administered   Fluad Quad(high Dose 65+) 06/18/2022, 08/04/2023   INFLUENZA, HIGH DOSE SEASONAL PF 06/24/2017, 07/07/2018   Influenza Split 10/23/2014   Influenza,inj,Quad PF,6+ Mos 08/07/2019   Influenza,inj,quad, With Preservative 07/17/2018   Influenza-Unspecified 10/23/2014, 10/25/2016, 06/24/2017, 07/08/2020, 08/04/2021, 07/23/2023   PFIZER Comirnaty(Gray Top)Covid-19 Tri-Sucrose Vaccine 11/03/2019, 11/27/2019,  09/16/2020, 03/17/2021   Pneumococcal Conjugate-13 04/14/2018   Pneumococcal Polysaccharide-23 04/09/2019   Tdap 04/06/2018   Unspecified SARS-COV-2 Vaccination 07/03/2021, 08/23/2023   Zoster Recombinant(Shingrix) 04/02/2023, 07/03/2023        Objective:     BP (!) 146/94   Pulse 66   Temp 97.8 F (36.6 C) (Temporal)   Ht 5' 3 (1.6 m)   Wt 198 lb 3.2 oz (89.9 kg)   SpO2 97%   BMI 35.11 kg/m   SpO2: 97 %  GENERAL: Obese woman, no acute distress, no conversational dyspnea, fully ambulatory. HEAD: Normocephalic, atraumatic.  EYES: Pupils equal, round, reactive to light.  No scleral icterus.  MOUTH: Upper dentures, lower dentures, oral mucosa moist. NECK: Supple. No thyromegaly. Trachea midline. No JVD.  No adenopathy. PULMONARY: Good air entry bilaterally.  Coarse, otherwise, no adventitious sounds. CARDIOVASCULAR: S1 and S2. Regular rate and rhythm.  No rubs, murmurs or gallops heard.   ABDOMEN: Obese otherwise, benign. MUSCULOSKELETAL: No clubbing, no edema.  Well-healed knee arthroplasty scar on the right, redness and mild induration on incision of left arthroplasty. NEUROLOGIC: No overt focal deficit, no gait disturbance, speech is fluent. SKIN: Intact,warm,dry. PSYCH: Mood and behavior normal.    Lab Results  Component Value Date   NITRICOXIDE 20 06/28/2024  *Low level of nitric oxide  present suggestive of low level of type II inflammation.   Assessment & Plan:     ICD-10-CM   1. Moderate persistent asthma without complication  J45.40     2. SOB (shortness of breath)  R06.02 Nitric oxide     3. Obesity, Class II, BMI 35-39.9  E66.812     4. OSA (obstructive sleep apnea)  G47.33     5. Superficial incisional infection of surgical site - possible  T81.41XA       Orders Placed This Encounter  Procedures   Nitric oxide    Discussion:    Asthma, well-controlled Asthma is well-controlled with current medication regimen. - Continue Trelegy daily. - Use  rescue inhaler as needed for shortness of breath, up to three or four times a day.  Exertional shortness of breath Exertional shortness of breath likely due to deconditioning post-surgery. - Initiate pulmonary rehabilitation program after recovery from current issues.  Obstructive sleep apnea, on CPAP therapy CPAP pressure set at 11 cm H2O. Temporary discontinuation of CPAP use during hospitalization, but resumed at home. - Continue CPAP therapy with pressure set at 11 cm H2O.  Postoperative wound inflammation Area is warm, swollen, and painful, indicating possible infection or inflammation. - Contact orthopedic group for evaluation of the wound site for possible infection.       Advised if symptoms do not  improve or worsen, to please contact office for sooner follow up or seek emergency care.    I spent 30 minutes of dedicated to the care of this patient on the date of this encounter to include pre-visit review of records, face-to-face time with the patient discussing conditions above, post visit ordering of testing, clinical documentation with the electronic health record, making appropriate referrals as documented, and communicating necessary findings to members of the patients care team.     C. Leita Sanders, MD Advanced Bronchoscopy [facility]    *This note was generated using voice recognition software/Dragon and/or AI transcription program.  Despite best efforts to proofread, errors can occur which can change the meaning. Any transcriptional errors that result from this process are unintentional and may not be fully corrected at the time of dictation.
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
## 2024-07-03 ENCOUNTER — Ambulatory Visit

## 2024-07-03 DIAGNOSIS — M25662 Stiffness of left knee, not elsewhere classified: Secondary | ICD-10-CM | POA: Diagnosis not present

## 2024-07-03 DIAGNOSIS — M6281 Muscle weakness (generalized): Secondary | ICD-10-CM

## 2024-07-03 DIAGNOSIS — R2689 Other abnormalities of gait and mobility: Secondary | ICD-10-CM
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
## 2024-07-05 ENCOUNTER — Ambulatory Visit

## 2024-07-05 DIAGNOSIS — R2689 Other abnormalities of gait and mobility: Secondary | ICD-10-CM

## 2024-07-05 DIAGNOSIS — M25662 Stiffness of left knee, not elsewhere classified: Secondary | ICD-10-CM | POA: Diagnosis not present

## 2024-07-05 DIAGNOSIS — M6281 Muscle weakness (generalized): Secondary | ICD-10-CM
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
## 2024-07-10 ENCOUNTER — Ambulatory Visit

## 2024-07-10 DIAGNOSIS — M25662 Stiffness of left knee, not elsewhere classified: Secondary | ICD-10-CM | POA: Diagnosis not present

## 2024-07-10 DIAGNOSIS — R2689 Other abnormalities of gait and mobility: Secondary | ICD-10-CM

## 2024-07-10 DIAGNOSIS — M6281 Muscle weakness (generalized): Secondary | ICD-10-CM
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
## 2024-07-12 ENCOUNTER — Ambulatory Visit

## 2024-07-12 DIAGNOSIS — M25662 Stiffness of left knee, not elsewhere classified: Secondary | ICD-10-CM | POA: Diagnosis not present

## 2024-07-12 DIAGNOSIS — R2689 Other abnormalities of gait and mobility: Secondary | ICD-10-CM

## 2024-07-12 DIAGNOSIS — M6281 Muscle weakness (generalized): Secondary | ICD-10-CM

## 2024-07-14 ENCOUNTER — Encounter: Payer: Self-pay | Admitting: Pulmonary Disease
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
## 2024-07-17 ENCOUNTER — Ambulatory Visit

## 2024-07-17 DIAGNOSIS — R2689 Other abnormalities of gait and mobility: Secondary | ICD-10-CM

## 2024-07-17 DIAGNOSIS — M6281 Muscle weakness (generalized): Secondary | ICD-10-CM

## 2024-07-17 DIAGNOSIS — M25662 Stiffness of left knee, not elsewhere classified: Secondary | ICD-10-CM

## 2024-07-18 ENCOUNTER — Encounter
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
## 2024-07-19 ENCOUNTER — Ambulatory Visit
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
## 2024-07-24 ENCOUNTER — Ambulatory Visit: Attending: Orthopedic Surgery

## 2024-07-24 DIAGNOSIS — M6281 Muscle weakness (generalized): Secondary | ICD-10-CM | POA: Diagnosis present

## 2024-07-24 DIAGNOSIS — R2689 Other abnormalities of gait and mobility: Secondary | ICD-10-CM | POA: Insufficient documentation

## 2024-07-24 DIAGNOSIS — R262 Difficulty in walking, not elsewhere classified: Secondary | ICD-10-CM | POA: Diagnosis present

## 2024-07-24 DIAGNOSIS — M25661 Stiffness of right knee, not elsewhere classified: Secondary | ICD-10-CM | POA: Insufficient documentation

## 2024-07-24 DIAGNOSIS — M25561 Pain in right knee: Secondary | ICD-10-CM | POA: Diagnosis present

## 2024-07-24 DIAGNOSIS — M25662 Stiffness of left knee, not elsewhere classified: Secondary | ICD-10-CM | POA: Diagnosis present
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
## 2024-07-25 ENCOUNTER — Ambulatory Visit

## 2024-07-25 DIAGNOSIS — R2689 Other abnormalities of gait and mobility: Secondary | ICD-10-CM

## 2024-07-25 DIAGNOSIS — M25662 Stiffness of left knee, not elsewhere classified: Secondary | ICD-10-CM | POA: Diagnosis not present

## 2024-07-25 DIAGNOSIS — M6281 Muscle weakness (generalized): Secondary | ICD-10-CM

## 2024-07-26 ENCOUNTER — Encounter
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
## 2024-07-30 ENCOUNTER — Ambulatory Visit

## 2024-07-30 DIAGNOSIS — R2689 Other abnormalities of gait and mobility: Secondary | ICD-10-CM

## 2024-07-30 DIAGNOSIS — M25662 Stiffness of left knee, not elsewhere classified: Secondary | ICD-10-CM

## 2024-07-30 DIAGNOSIS — M6281 Muscle weakness (generalized): Secondary | ICD-10-CM
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
## 2024-08-01 ENCOUNTER — Ambulatory Visit

## 2024-08-01 DIAGNOSIS — M25662 Stiffness of left knee, not elsewhere classified: Secondary | ICD-10-CM

## 2024-08-01 DIAGNOSIS — R2689 Other abnormalities of gait and mobility: Secondary | ICD-10-CM

## 2024-08-01 DIAGNOSIS — M6281 Muscle weakness (generalized): Secondary | ICD-10-CM

## 2024-08-06 ENCOUNTER — Ambulatory Visit

## 2024-08-06 ENCOUNTER — Ambulatory Visit
Admission: EM | Admit: 2024-08-06 | Discharge: 2024-08-06 | Disposition: A | Discharge status: Home / Self Care | Attending: Emergency Medicine | Admitting: Emergency Medicine

## 2024-08-06 ENCOUNTER — Encounter: Payer: Self-pay | Admitting: Emergency Medicine

## 2024-08-06 DIAGNOSIS — M79675 Pain in left toe(s): Secondary | ICD-10-CM | POA: Diagnosis not present

## 2024-08-06 DIAGNOSIS — M7989 Other specified soft tissue disorders: Secondary | ICD-10-CM | POA: Diagnosis not present

## 2024-08-06 MED ORDER — CEPHALEXIN 500 MG PO CAPS: 500.0000 mg | ORAL_CAPSULE | Freq: Three times a day (TID) | ORAL | 0 refills | Status: AC

## 2024-08-06 MED ORDER — MELOXICAM 7.5 MG PO TABS: 7.5000 mg | ORAL_TABLET | Freq: Every day | ORAL | 0 refills | Status: AC

## 2024-08-06 MED ORDER — KETOROLAC TROMETHAMINE 30 MG/ML IJ SOLN
30.0000 mg | Freq: Once | INTRAMUSCULAR | Status: AC
  Administered 2024-08-06: 30 mg via INTRAMUSCULAR

## 2024-08-06 NOTE — ED Triage Notes (Signed)
 Patient complains of pain, redness to 2nd toe on left foot x 6 days. Patient has not taken anything for symptoms. Rates pain 6/10. Denies injury. Patient is also pre- diabetic.
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
## 2024-08-06 NOTE — Discharge Instructions (Signed)
 Today you are evaluated for the pain and swelling to the left foot which is concerning for infection as it has not improved in 2 weeks, could also be arthritic flare therefore we will provide treatment for both today  You have been given an injection of Toradol which helps reduce inflammation and pain and ideally will start to see improvement within the next 30 minutes  At home begin meloxicam daily as needed, this will replace your ibuprofen but you may use Tylenol  or any topical medicines  For coverage for infection take cephalexin every 8 hours for 5 days  May continue to apply ice over the affected area in 10 to 15-minute intervals  Elevate whenever sitting and lying to help reduce swelling  If you continue to have symptoms please schedule follow-up appointment with your primary doctor for reevaluation

## 2024-08-09 ENCOUNTER — Ambulatory Visit

## 2024-08-13 ENCOUNTER — Ambulatory Visit

## 2024-08-13 ENCOUNTER — Encounter

## 2024-08-13 DIAGNOSIS — R2689 Other abnormalities of gait and mobility: Secondary | ICD-10-CM

## 2024-08-13 DIAGNOSIS — M25561 Pain in right knee: Secondary | ICD-10-CM

## 2024-08-13 DIAGNOSIS — M25662 Stiffness of left knee, not elsewhere classified: Secondary | ICD-10-CM

## 2024-08-13 DIAGNOSIS — R262 Difficulty in walking, not elsewhere classified: Secondary | ICD-10-CM

## 2024-08-13 DIAGNOSIS — M25661 Stiffness of right knee, not elsewhere classified: Secondary | ICD-10-CM

## 2024-08-13 DIAGNOSIS — M6281 Muscle weakness (generalized): Secondary | ICD-10-CM
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
## 2024-08-15 ENCOUNTER — Encounter

## 2024-08-20 ENCOUNTER — Telehealth: Payer: Self-pay

## 2024-08-20 ENCOUNTER — Ambulatory Visit

## 2024-08-20 ENCOUNTER — Encounter

## 2024-08-20 NOTE — Telephone Encounter (Signed)
 Patient Name: Mary Meyers MRN: [id number] DOB:03/06/1946, 78 y.o., female Today's Date: 08/20/2024  Pt contacted via telephone and author left voice mail informing of missed appointment and informed pt of future PT appointment date and time.       Reyes LOISE London, PT 08/20/2024, 12:14 PM
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
## 2024-08-21 ENCOUNTER — Ambulatory Visit: Attending: Family Medicine

## 2024-08-21 DIAGNOSIS — M25561 Pain in right knee: Secondary | ICD-10-CM | POA: Insufficient documentation

## 2024-08-21 DIAGNOSIS — R2689 Other abnormalities of gait and mobility: Secondary | ICD-10-CM | POA: Diagnosis present

## 2024-08-21 DIAGNOSIS — M25662 Stiffness of left knee, not elsewhere classified: Secondary | ICD-10-CM | POA: Insufficient documentation

## 2024-08-21 DIAGNOSIS — M25661 Stiffness of right knee, not elsewhere classified: Secondary | ICD-10-CM | POA: Insufficient documentation

## 2024-08-21 DIAGNOSIS — R262 Difficulty in walking, not elsewhere classified: Secondary | ICD-10-CM | POA: Insufficient documentation

## 2024-08-21 DIAGNOSIS — M6281 Muscle weakness (generalized): Secondary | ICD-10-CM | POA: Diagnosis present
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
## 2024-08-23 ENCOUNTER — Encounter

## 2024-08-23 ENCOUNTER — Ambulatory Visit

## 2024-08-23 DIAGNOSIS — R262 Difficulty in walking, not elsewhere classified: Secondary | ICD-10-CM

## 2024-08-23 DIAGNOSIS — M25661 Stiffness of right knee, not elsewhere classified: Secondary | ICD-10-CM

## 2024-08-23 DIAGNOSIS — R2689 Other abnormalities of gait and mobility: Secondary | ICD-10-CM

## 2024-08-23 DIAGNOSIS — M25662 Stiffness of left knee, not elsewhere classified: Secondary | ICD-10-CM

## 2024-08-23 DIAGNOSIS — M6281 Muscle weakness (generalized): Secondary | ICD-10-CM
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
## 2024-08-31 ENCOUNTER — Ambulatory Visit

## 2024-08-31 DIAGNOSIS — M6281 Muscle weakness (generalized): Secondary | ICD-10-CM | POA: Diagnosis not present

## 2024-08-31 DIAGNOSIS — M25661 Stiffness of right knee, not elsewhere classified: Secondary | ICD-10-CM

## 2024-08-31 DIAGNOSIS — R262 Difficulty in walking, not elsewhere classified: Secondary | ICD-10-CM

## 2024-08-31 DIAGNOSIS — M25662 Stiffness of left knee, not elsewhere classified: Secondary | ICD-10-CM

## 2024-08-31 DIAGNOSIS — R2689 Other abnormalities of gait and mobility: Secondary | ICD-10-CM

## 2024-08-31 DIAGNOSIS — M25561 Pain in right knee: Secondary | ICD-10-CM

## 2024-09-04 ENCOUNTER — Ambulatory Visit

## 2024-09-04 DIAGNOSIS — M25662 Stiffness of left knee, not elsewhere classified: Secondary | ICD-10-CM

## 2024-09-04 DIAGNOSIS — R2689 Other abnormalities of gait and mobility: Secondary | ICD-10-CM

## 2024-09-04 DIAGNOSIS — M25661 Stiffness of right knee, not elsewhere classified: Secondary | ICD-10-CM

## 2024-09-04 DIAGNOSIS — M6281 Muscle weakness (generalized): Secondary | ICD-10-CM | POA: Diagnosis not present

## 2024-09-04 DIAGNOSIS — R262 Difficulty in walking, not elsewhere classified: Secondary | ICD-10-CM

## 2024-09-04 NOTE — Therapy (Signed)
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 ambulation compared to age matched norms.  Age Matched Norms: 18-69 yo M: 60 F: 71, 57-79 yo M: 43 F: 471, 52-89 yo M: 417 F: 392 MDC: 58.21 meters (190.98 feet) or 50 meters (ANPTA Core Set of Outcome Measures for Adults with Neurologic Conditions, 2018)                                                                                                                                TREATMENT DATE: 08/31/24  NMR:  Dynamic marching on airex pad x 30 reps alt LE Dynamic step tap (from airex pad to incline step) x 25 alt LE  Activity Description: Random- BUE and LE reaching- step tapping Activity Setting:  Random Number of Pods:  6 (3 on dry erase board and 3 on floor)  Cycles/Sets:  5 Duration (Time or Hit Count):  1 min Patient Stats Hits: 11, 14, 15, 15, 16  Activity Description: Random- BUE and LE reaching- step tapping with all pods on floor - side stepping with pods in singular line Activity Setting:  Random Number of Pods:  6   Cycles/Sets:  3 Duration (Time or Hit Count):  1 min Patient Stats Hits: 17, 19, 21 Activity Description: Random- BUE and LE reaching- step tapping Activity Setting:  Random Number of Pods:  6 (3 in front of patient and 3 behind covering 6 feet length Cycles/Sets:  5 Duration (Time or Hit Count):  1 min Patient Stats Hits:       PATIENT EDUCATION: Education details: Pt educated on role of PT and services provided during current POC, along with prognosis and information about the clinic.  Person educated: Patient Education method: Explanation Education comprehension: verbalized understanding  HOME EXERCISE PROGRAM:  Access Code: [email or web address] URL: [email or web address] Date: 08/21/2024 Prepared by: Sidra Simpers  Exercises - Standing Hip Abduction with Counter Support  - 1 x daily - 3-4 x weekly - 3 sets - 10  reps - Standing Hip Extension with Counter Support  - 1 x daily - 3-4 x weekly - 3 sets -  10 reps - Mini Squat with Counter Support  - 1 x daily - 7 x weekly - 3 sets - 10 reps - Heel Raises with Counter Support  - 1 x daily - 3-4 x weekly - 3 sets - 10 reps  GOALS: Goals reviewed with patient? Yes  SHORT TERM GOALS: Target date: 09/10/2024  Pt will be independent with HEP in order to demonstrate increased ability to perform tasks related to occupation/hobbies. Baseline: to be given at subsequent session Goal status: INITIAL   LONG TERM GOALS: Target date: 11/05/2024  1.  Patient (> 50 years old) will complete five times sit to stand test in < 15 seconds indicating an increased LE strength and improved balance. Baseline: 15.98 sec Goal status: INITIAL  2.  Patient will increase FGA score by > 4 points to demonstrate decreased fall risk during functional activities.  Baseline: 20/30 Goal status: INITIAL    3.  Patient will reduce timed up and go to <11 seconds to reduce fall risk and demonstrate improved transfer/gait ability. Baseline: 12.19 sec Goal status: INITIAL  4.  Patient will increase 10 meter walk test to >1.37m/s as to improve gait speed for better community ambulation and to reduce fall risk. Baseline: 12.94 sec; 0.77 m/s Goal status: INITIAL  5.  Patient will increase six minute walk test distance to >1400 for progression to community ambulator and improve gait ability Baseline: 1215' Goal status: INITIAL     ASSESSMENT:  CLINICAL IMPRESSION:   Treatment limited secondary to patient late arrival and continued to focus on balance and overall LE strengthening today. She participated in circuit style workout and was challenged with dynamic balance including bwd walking and horizontal head turns-with more unsteadiness with all dynamic mobility. She did respond well to VC and did adapt well- demonstrating improved balance with practice today. Less scissoring overall  with bwd walking with practice.   Pt will continue to benefit from skilled therapy to address remaining deficits in order to improve overall QoL and return to PLOF.       OBJECTIVE IMPAIRMENTS: Abnormal gait, decreased balance, difficulty walking, and decreased strength.   ACTIVITY LIMITATIONS: carrying, squatting, stairs, and locomotion level  PARTICIPATION LIMITATIONS: cleaning, laundry, shopping, community activity, and yard work  PERSONAL FACTORS: Age, Fitness, Past/current experiences, Time since onset of injury/illness/exacerbation, and 3+ comorbidities: anxiety, cancer, depression, HTN, B TKA, tremors are also affecting patient's functional outcome.   REHAB POTENTIAL: Good  CLINICAL DECISION MAKING: Stable/uncomplicated  EVALUATION COMPLEXITY: Low  PLAN:  PT FREQUENCY: 1-2x/week  PT DURATION: 12 weeks  PLANNED INTERVENTIONS: 97750- Physical Performance Testing, 97110-Therapeutic exercises, 97530- Therapeutic activity, 97112- Neuromuscular re-education, 97535- Self Care, 02859- Manual therapy, [phone]- Gait training, [phone]- Canalith repositioning, Patient/Family education, Joint mobilization, and Vestibular training  PLAN FOR NEXT SESSION:  Progress LE strengthening Progress HEP Balance Program with cognitive components KoreBalance, Blazepods   Chyrl London, PT Physical Therapist - [facility]  09/04/24, 2:26 PM

## 2024-09-06 ENCOUNTER — Ambulatory Visit: Admitting: Physical Therapy

## 2024-09-06 DIAGNOSIS — R262 Difficulty in walking, not elsewhere classified: Secondary | ICD-10-CM

## 2024-09-06 DIAGNOSIS — R2689 Other abnormalities of gait and mobility: Secondary | ICD-10-CM

## 2024-09-06 DIAGNOSIS — M6281 Muscle weakness (generalized): Secondary | ICD-10-CM | POA: Diagnosis not present

## 2024-09-06 DIAGNOSIS — M25662 Stiffness of left knee, not elsewhere classified: Secondary | ICD-10-CM
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
## 2024-09-10 ENCOUNTER — Ambulatory Visit

## 2024-09-10 DIAGNOSIS — R2689 Other abnormalities of gait and mobility: Secondary | ICD-10-CM

## 2024-09-10 DIAGNOSIS — R262 Difficulty in walking, not elsewhere classified: Secondary | ICD-10-CM

## 2024-09-10 DIAGNOSIS — M6281 Muscle weakness (generalized): Secondary | ICD-10-CM | POA: Diagnosis not present
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
## 2024-09-18 ENCOUNTER — Ambulatory Visit: Attending: Orthopedic Surgery

## 2024-09-18 DIAGNOSIS — M6281 Muscle weakness (generalized): Secondary | ICD-10-CM | POA: Insufficient documentation

## 2024-09-18 DIAGNOSIS — R2689 Other abnormalities of gait and mobility: Secondary | ICD-10-CM | POA: Diagnosis present

## 2024-09-18 DIAGNOSIS — M25661 Stiffness of right knee, not elsewhere classified: Secondary | ICD-10-CM | POA: Insufficient documentation

## 2024-09-18 DIAGNOSIS — M25662 Stiffness of left knee, not elsewhere classified: Secondary | ICD-10-CM | POA: Insufficient documentation

## 2024-09-18 DIAGNOSIS — M25561 Pain in right knee: Secondary | ICD-10-CM | POA: Insufficient documentation

## 2024-09-18 DIAGNOSIS — R262 Difficulty in walking, not elsewhere classified: Secondary | ICD-10-CM | POA: Insufficient documentation
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
## 2024-09-20 ENCOUNTER — Telehealth: Payer: Self-pay

## 2024-09-20 ENCOUNTER — Ambulatory Visit

## 2024-09-20 NOTE — Telephone Encounter (Signed)
 Pt did not arrive for scheduled appointment. No telephone call nor message preceeded this absence. Author attempted to contact pt via telephone number listed in chart. Left a HIPAA friendly VM.   2:35 PM, 09/20/24 Peggye JAYSON Linear, PT, DPT Physical Therapist - [place] [facility]  Outpatient Physical Therapy- Main Campus [phone]

## 2024-09-24 ENCOUNTER — Ambulatory Visit

## 2024-09-24 DIAGNOSIS — R2689 Other abnormalities of gait and mobility: Secondary | ICD-10-CM

## 2024-09-24 DIAGNOSIS — M25662 Stiffness of left knee, not elsewhere classified: Secondary | ICD-10-CM

## 2024-09-24 DIAGNOSIS — M25661 Stiffness of right knee, not elsewhere classified: Secondary | ICD-10-CM

## 2024-09-24 DIAGNOSIS — M6281 Muscle weakness (generalized): Secondary | ICD-10-CM

## 2024-09-24 DIAGNOSIS — M25561 Pain in right knee: Secondary | ICD-10-CM

## 2024-09-24 DIAGNOSIS — R262 Difficulty in walking, not elsewhere classified: Secondary | ICD-10-CM
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
## 2024-09-26 ENCOUNTER — Ambulatory Visit

## 2024-09-26 DIAGNOSIS — M6281 Muscle weakness (generalized): Secondary | ICD-10-CM

## 2024-09-26 DIAGNOSIS — R262 Difficulty in walking, not elsewhere classified: Secondary | ICD-10-CM

## 2024-09-26 DIAGNOSIS — M25661 Stiffness of right knee, not elsewhere classified: Secondary | ICD-10-CM

## 2024-09-26 DIAGNOSIS — R2689 Other abnormalities of gait and mobility: Secondary | ICD-10-CM

## 2024-09-26 DIAGNOSIS — M25662 Stiffness of left knee, not elsewhere classified: Secondary | ICD-10-CM

## 2024-09-26 DIAGNOSIS — M25561 Pain in right knee: Secondary | ICD-10-CM
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
## 2024-09-27 ENCOUNTER — Encounter: Payer: Self-pay | Admitting: Pulmonary Disease

## 2024-09-27 ENCOUNTER — Ambulatory Visit: Admitting: Pulmonary Disease

## 2024-09-27 VITALS — BP 136/78 | HR 61 | Temp 98.1°F | Ht 63.0 in | Wt 199.6 lb

## 2024-09-27 DIAGNOSIS — J4540 Moderate persistent asthma, uncomplicated: Secondary | ICD-10-CM | POA: Diagnosis not present

## 2024-09-27 DIAGNOSIS — I272 Pulmonary hypertension, unspecified: Secondary | ICD-10-CM

## 2024-09-27 DIAGNOSIS — R0602 Shortness of breath: Secondary | ICD-10-CM

## 2024-09-27 DIAGNOSIS — G4733 Obstructive sleep apnea (adult) (pediatric): Secondary | ICD-10-CM

## 2024-09-27 DIAGNOSIS — Z87891 Personal history of nicotine dependence: Secondary | ICD-10-CM | POA: Diagnosis not present

## 2024-09-27 LAB — NITRIC OXIDE: Nitric Oxide: 33

## 2024-09-27 NOTE — Patient Instructions (Addendum)
 VISIT SUMMARY:  Today, you came in for a follow-up visit to discuss your persistent asthma, obstructive sleep apnea, and recent episodes of gout-like symptoms. We also reviewed your weight management efforts and balance issues.  YOUR PLAN:  -MODERATE PERSISTENT ASTHMA: Moderate persistent asthma is a condition where the airways in your lungs are inflamed and narrowed, causing difficulty in breathing. You experience occasional shortness of breath during physical activity, which may be due to your physical condition or anxiety. We support your ongoing weight management efforts with dietary modifications to help improve your overall health.  -OBSTRUCTIVE SLEEP APNEA: Obstructive sleep apnea is a condition where your breathing repeatedly stops and starts during sleep due to blocked airways. You reported difficulty with mask fit and dryness, likely due to the dry environment from heating. We provided you with a sample of a nasal CPAP (Air Touch N30i, M) mask to try and see if it improves your comfort.  -PULMONARY HYPERTENSION MULTIFACTORIAL: Pulmonary hypertension is high blood pressure in the arteries of your lungs, often caused by diseases like obstructive sleep apnea and diastolic dysfunction of the heart. Your last echocardiogram showed improvement, and it is not severe. We will continue to manage your sleep apnea to help control the pulmonary hypertension. A follow-up echocardiogram is planned for January to monitor your condition.  INSTRUCTIONS:  Please continue using your CPAP machine and try the nasal mask sample provided. Follow your modified diet plan to support weight management. We will schedule a follow-up echocardiogram in January to monitor your pulmonary hypertension. If you experience any new or worsening symptoms, please contact our office.    RESUMEN DE LA VISITA:  Hoy acudi a una visita de seguimiento para hablar sobre su asma persistente, apnea obstructiva del sueo y episodios  recientes de sntomas similares a la gota. Tambin revisamos sus esfuerzos para controlar el peso y sus problemas de equilibrio.  SU PLAN:  - ASMA PERSISTENTE MODERADA: El asma persistente moderada es una afeccin en la que las vas respiratorias de los pulmones se inflaman y se engineer, technical sales, lo que dificulta la respiracin. Experimenta dificultad para respirar ocasionalmente durante la actividad fsica, lo que puede deberse a su condicin fsica o a la ansiedad. Apoyamos sus esfuerzos continuos para controlar el peso con modificaciones en la dieta para ayudar a probation officer general.  - APNEA OBSTRUCTIVA DEL SUEO: La apnea obstructiva del sueo es una afeccin en la que la respiracin se detiene y se reanuda repetidamente durante el sueo debido a la obstruccin de las vas respiratorias. Inform de dificultades con el ajuste de la mascarilla y sequedad, [place] debido al ambiente seco causado por la calefaccin. Le proporcionamos una muestra de una mascarilla CPAP nasal [employer] N30i, M) para que la pruebe y vea si mejora su comodidad.  HIPERTENSIN PULMONAR MULTIFACTORIAL: La hipertensin pulmonar es la presin arterial alta en las arterias de los pulmones, a menudo causada por enfermedades como la apnea obstructiva del sueo y la disfuncin diastlica del corazn. Su ltimo ecocardiograma mostr mejora y no es grave. Continuaremos controlando su apnea del sueo para ayudar a scientist, physiological hipertensin pulmonar. Se ha programado un ecocardiograma de seguimiento para enero para monitorear su condicin.  INSTRUCCIONES:  Por favor, contine usando su mquina CPAP y pruebe la mascarilla nasal de muestra que se le proporcion. Siga su plan de dieta modificado para controlar su peso. Programaremos un ecocardiograma de seguimiento en enero para monitorear su hipertensin pulmonar. Si experimenta algn sntoma nuevo o un empeoramiento de los  sntomas, comunquese con nuestro consultorio.

## 2024-09-27 NOTE — Progress Notes (Unsigned)
 Subjective:    Patient ID: Mary Meyers, female    DOB: [birth year], 78 y.o.   MRN: [id number]  Patient Care Team: Glover Lenis, MD as PCP - General (Family Medicine) Anner Lenis ORN, MD as PCP - Cardiology (Cardiology)  Chief Complaint  Patient presents with   Asthma    Shortness of breath on exertion. Having to clear her throat, during the morning. Using trelegy daily.     BACKGROUND/INTERVAL:  HPI   Review of Systems A 10 point review of systems was performed and it is as noted above otherwise negative.   Patient Active Problem List   Diagnosis Date Noted   S/P total knee arthroplasty, left 06/04/2024   Degenerative arthritis of left knee 06/01/2024   S/P total knee arthroplasty, right 02/13/2024   Osteoarthritis of right knee 02/08/2024   Pulmonary hypertension (HCC) 12/16/2022   Chronic dyspnea 11/05/2022   PMB (postmenopausal bleeding) 03/18/2021   B12 deficiency 11/21/2020   Elevated glucose 11/21/2020   Vertigo 04/09/2019   History of pulmonary embolism 01/20/2018   Endometrial cancer (HCC) 12/13/2017   S/P TAH-BSO 11/14/2017   Hypertension 11/01/2017   Bradycardia 10/03/2017   Osteoarthritis of knee 02/07/2017   Tremor 10/29/2016   Arthritis 05/17/2016   Depression 05/17/2016   Gastroesophageal reflux disease without esophagitis 05/17/2016   Acquired hypothyroidism 09/22/2015   Hyperlipidemia 09/22/2015   Cough 03/27/2013   Abnormal chest CT 03/27/2013    Social History   Tobacco Use   Smoking status: Former    Current packs/day: 0.00    Types: Cigarettes    Quit date: 10/19/1971    Years since quitting: 52.9    Passive exposure: Never   Smokeless tobacco: Never  Substance Use Topics   Alcohol use: Not Currently    Allergies[1]  Active Medications[2]  Immunization History  Administered Date(s) Administered   Fluad Quad(high Dose 65+) 06/18/2022, 08/04/2023   INFLUENZA, HIGH DOSE SEASONAL PF 06/24/2017, 07/07/2018   Influenza  Split 10/23/2014   Influenza,inj,Quad PF,6+ Mos 08/07/2019   Influenza,inj,quad, With Preservative 07/17/2018   Influenza-Unspecified 10/23/2014, 10/25/2016, 06/24/2017, 07/08/2020, 08/04/2021, 07/23/2023   PFIZER Comirnaty(Gray Top)Covid-19 Tri-Sucrose Vaccine 11/03/2019, 11/27/2019, 09/16/2020, 03/17/2021   Pneumococcal Conjugate-13 04/14/2018   Pneumococcal Polysaccharide-23 04/09/2019   Tdap 04/06/2018   Unspecified SARS-COV-2 Vaccination 07/03/2021, 08/23/2023   Zoster Recombinant(Shingrix) 04/02/2023, 07/03/2023        Objective:     Vitals:   09/27/24 1045  BP: 136/78  Pulse: 61  Temp: 98.1 F (36.7 C)  Height: 5' 3 (1.6 m)  Weight: 199 lb 9.6 oz (90.5 kg)  SpO2: 96%  TempSrc: Temporal  BMI (Calculated): 35.37     SpO2: 96 %  GENERAL: HEAD: Normocephalic, atraumatic.  EYES: Pupils equal, round, reactive to light.  No scleral icterus.  MOUTH:  NECK: Supple. No thyromegaly. Trachea midline. No JVD.  No adenopathy. PULMONARY: Good air entry bilaterally.  No adventitious sounds. CARDIOVASCULAR: S1 and S2. Regular rate and rhythm.  ABDOMEN: MUSCULOSKELETAL: No joint deformity, no clubbing, no edema.  NEUROLOGIC:  SKIN: Intact,warm,dry. PSYCH:        Assessment & Plan:   No diagnosis found.  No orders of the defined types were placed in this encounter.   No orders of the defined types were placed in this encounter.     Advised if symptoms do not improve or worsen, to please contact office for sooner follow up or seek emergency care.    I spent xxx minutes of dedicated to the care  of this patient on the date of this encounter to include pre-visit review of records, face-to-face time with the patient discussing conditions above, post visit ordering of testing, clinical documentation with the electronic health record, making appropriate referrals as documented, and communicating necessary findings to members of the patients care team.     C. Leita Sanders, MD Advanced Bronchoscopy PCCM [facility]    *This note was generated using voice recognition software/Dragon and/or AI transcription program.  Despite best efforts to proofread, errors can occur which can change the meaning. Any transcriptional errors that result from this process are unintentional and may not be fully corrected at the time of dictation.    [1]  Allergies Allergen Reactions   Cortisone Hives and Other (See Comments)    Burning/swelling.   Iodinated Contrast Media Other (See Comments)    Burning/swelling 11/15/22 - contrasted cardiac CTA w/ 13 hr prep , called back on 11/16/22 with itching, hives, swelling to face. Instructed patient to never have contrast ever again due to breakthru reaction   Prednisone  Hives   Oxycodone  Rash   Tramadol Nausea And Vomiting and Other (See Comments)    dizziness  [2]  Current Meds  Medication Sig   albuterol  (VENTOLIN  HFA) 108 (90 Base) MCG/ACT inhaler Inhale 2 puffs into the lungs every 6 (six) hours as needed.   apixaban  (ELIQUIS ) 2.5 MG TABS tablet Take 1 tablet (2.5 mg total) by mouth 2 (two) times daily.   apixaban  (ELIQUIS ) 5 MG TABS tablet Take 5 mg by mouth See admin instructions. Take 5 mg twice daily only when traveling by plane more than 5 hours   Apple Cider Vinegar 250 MG CHEW Chew 500 mg by mouth daily.   Ascorbic Acid (VITAMIN C PO) Take 2 capsules by mouth daily.   atorvastatin  (LIPITOR) 20 MG tablet Take 20 mg by mouth daily.   buPROPion  (WELLBUTRIN  XL) 150 MG 24 hr tablet Take 150 mg by mouth daily.   cetirizine (ZYRTEC) 10 MG tablet Take 10 mg by mouth daily.   cholecalciferol (VITAMIN D3) 25 MCG (1000 UNIT) tablet Take 2,000 Units by mouth daily.   EPINEPHrine  (EPIPEN  2-PAK) 0.3 mg/0.3 mL IJ SOAJ injection Inject 0.3 mg into the muscle as needed.   famotidine  (PEPCID ) 20 MG tablet Take 20 mg by mouth daily as needed for heartburn.   Fluticasone -Umeclidin-Vilant (TRELEGY ELLIPTA )  200-62.5-25 MCG/ACT AEPB Inhale 1 puff into the lungs daily.   furosemide  (LASIX ) 20 MG tablet Take 20 mg by mouth as needed for edema.   levothyroxine  (SYNTHROID ) 100 MCG tablet Take 100 mcg by mouth daily before breakfast.   meclizine  (ANTIVERT ) 25 MG tablet Take 50 mg by mouth 3 (three) times daily as needed (for vertigo).   meloxicam  (MOBIC ) 7.5 MG tablet Take 1 tablet (7.5 mg total) by mouth daily.   Multiple Vitamins-Minerals (HAIR SKIN AND NAILS FORMULA) TABS Take 2 tablets by mouth daily.   Multiple Vitamins-Minerals (IMMUNE SUPPORT PO) Take 2 tablets by mouth daily.   OZEMPIC, 0.25 OR 0.5 MG/DOSE, 2 MG/3ML SOPN Inject 0.25 mg as directed once a week.   pantoprazole  (PROTONIX ) 40 MG tablet Take 40 mg by mouth daily.   potassium chloride  (KLOR-CON ) 10 MEQ tablet Take 10 mEq by mouth daily.   tiZANidine  (ZANAFLEX ) 2 MG tablet Take 1 tablet (2 mg total) by mouth every 6 (six) hours as needed for muscle spasms.   triamterene -hydrochlorothiazide  (MAXZIDE ) 75-50 MG tablet Take 1 tablet by mouth daily.   TURMERIC-GINGER PO  Take 2 tablets by mouth daily.   venlafaxine  XR (EFFEXOR -XR) 150 MG 24 hr capsule Take 150 mg by mouth daily.

## 2024-10-01 ENCOUNTER — Ambulatory Visit

## 2024-10-01 DIAGNOSIS — M6281 Muscle weakness (generalized): Secondary | ICD-10-CM

## 2024-10-01 DIAGNOSIS — R2689 Other abnormalities of gait and mobility: Secondary | ICD-10-CM

## 2024-10-01 DIAGNOSIS — M25662 Stiffness of left knee, not elsewhere classified: Secondary | ICD-10-CM

## 2024-10-01 DIAGNOSIS — R262 Difficulty in walking, not elsewhere classified: Secondary | ICD-10-CM
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
## 2024-10-03 ENCOUNTER — Ambulatory Visit

## 2024-10-03 DIAGNOSIS — R262 Difficulty in walking, not elsewhere classified: Secondary | ICD-10-CM

## 2024-10-03 DIAGNOSIS — R2689 Other abnormalities of gait and mobility: Secondary | ICD-10-CM | POA: Diagnosis not present

## 2024-10-03 DIAGNOSIS — M25662 Stiffness of left knee, not elsewhere classified: Secondary | ICD-10-CM

## 2024-10-03 DIAGNOSIS — M6281 Muscle weakness (generalized): Secondary | ICD-10-CM

## 2024-10-08 ENCOUNTER — Ambulatory Visit
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
## 2024-10-09 ENCOUNTER — Ambulatory Visit

## 2024-10-09 DIAGNOSIS — R2689 Other abnormalities of gait and mobility: Secondary | ICD-10-CM | POA: Diagnosis not present

## 2024-10-09 DIAGNOSIS — M6281 Muscle weakness (generalized): Secondary | ICD-10-CM

## 2024-10-09 DIAGNOSIS — R262 Difficulty in walking, not elsewhere classified: Secondary | ICD-10-CM

## 2024-10-15 ENCOUNTER — Ambulatory Visit

## 2024-10-15 DIAGNOSIS — R262 Difficulty in walking, not elsewhere classified: Secondary | ICD-10-CM

## 2024-10-15 DIAGNOSIS — R2689 Other abnormalities of gait and mobility: Secondary | ICD-10-CM | POA: Diagnosis not present

## 2024-10-15 DIAGNOSIS — M6281 Muscle weakness (generalized): Secondary | ICD-10-CM
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
## 2024-10-17 ENCOUNTER — Ambulatory Visit

## 2024-10-17 DIAGNOSIS — R262 Difficulty in walking, not elsewhere classified: Secondary | ICD-10-CM

## 2024-10-17 DIAGNOSIS — R2689 Other abnormalities of gait and mobility: Secondary | ICD-10-CM

## 2024-10-17 DIAGNOSIS — M6281 Muscle weakness (generalized): Secondary | ICD-10-CM

## 2024-10-22 ENCOUNTER — Ambulatory Visit

## 2024-10-24 ENCOUNTER — Other Ambulatory Visit: Payer: Self-pay | Admitting: Family Medicine

## 2024-10-24 ENCOUNTER — Ambulatory Visit: Attending: Family Medicine

## 2024-10-24 DIAGNOSIS — R262 Difficulty in walking, not elsewhere classified: Secondary | ICD-10-CM | POA: Insufficient documentation

## 2024-10-24 DIAGNOSIS — Z1231 Encounter for screening mammogram for malignant neoplasm of breast: Secondary | ICD-10-CM

## 2024-10-24 DIAGNOSIS — R2689 Other abnormalities of gait and mobility: Secondary | ICD-10-CM | POA: Insufficient documentation

## 2024-10-24 DIAGNOSIS — M25662 Stiffness of left knee, not elsewhere classified: Secondary | ICD-10-CM | POA: Insufficient documentation

## 2024-10-24 DIAGNOSIS — M6281 Muscle weakness (generalized): Secondary | ICD-10-CM | POA: Insufficient documentation
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
## 2024-10-25 ENCOUNTER — Telehealth: Payer: Self-pay

## 2024-10-25 ENCOUNTER — Ambulatory Visit
Admission: RE | Admit: 2024-10-25 | Discharge: 2024-10-25 | Disposition: A | Discharge status: Home / Self Care | Source: Ambulatory Visit | Attending: Family Medicine | Admitting: Family Medicine

## 2024-10-25 DIAGNOSIS — Z1231 Encounter for screening mammogram for malignant neoplasm of breast: Secondary | ICD-10-CM | POA: Diagnosis present

## 2024-10-25 NOTE — Telephone Encounter (Signed)
 Left detailed message for patient to pick up form, at front desk. NFN.

## 2024-10-25 NOTE — Telephone Encounter (Signed)
 Okay for handicap sticker?

## 2024-10-25 NOTE — Telephone Encounter (Signed)
 Copied from CRM #[id number]. Topic: Clinical - Medical Advice >> Oct 25, 2024  1:22 PM Isabell A wrote: Reason for CRM: Patient states she recently saw Dr.Gonzalez but forgot to ask if she can fill out her handicap form for parking - for her breathing while walking long distances.   Callback number: [phone]

## 2024-10-29 ENCOUNTER — Ambulatory Visit

## 2024-10-29 DIAGNOSIS — R2689 Other abnormalities of gait and mobility: Secondary | ICD-10-CM

## 2024-10-29 DIAGNOSIS — M25662 Stiffness of left knee, not elsewhere classified: Secondary | ICD-10-CM

## 2024-10-29 DIAGNOSIS — R262 Difficulty in walking, not elsewhere classified: Secondary | ICD-10-CM

## 2024-10-29 DIAGNOSIS — M6281 Muscle weakness (generalized): Secondary | ICD-10-CM
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
## 2024-10-31 ENCOUNTER — Ambulatory Visit

## 2024-11-05 ENCOUNTER — Ambulatory Visit

## 2024-11-05 DIAGNOSIS — M6281 Muscle weakness (generalized): Secondary | ICD-10-CM

## 2024-11-05 DIAGNOSIS — R2689 Other abnormalities of gait and mobility: Secondary | ICD-10-CM

## 2024-11-05 DIAGNOSIS — R262 Difficulty in walking, not elsewhere classified: Secondary | ICD-10-CM
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
## 2024-11-07 ENCOUNTER — Ambulatory Visit

## 2024-11-07 DIAGNOSIS — M6281 Muscle weakness (generalized): Secondary | ICD-10-CM

## 2024-11-07 DIAGNOSIS — R2689 Other abnormalities of gait and mobility: Secondary | ICD-10-CM

## 2024-11-07 DIAGNOSIS — R262 Difficulty in walking, not elsewhere classified: Secondary | ICD-10-CM

## 2024-11-08 ENCOUNTER — Ambulatory Visit: Admitting: Cardiology

## 2024-11-08 NOTE — Progress Notes (Unsigned)
 " Cardiology Office Note:  .   Date:  11/08/2024  ID:  Mary Meyers, DOB 1946/05/09, MRN [id number] PCP: Glover Lenis, MD  [facility] Providers Cardiologist:  Lenis Clay, MD { Click to update primary MD,subspecialty MD or APP then REFRESH:1}    No chief complaint on file.   Patient Profile: Mary Meyers     Mary Meyers is a *** 79 y.o. female with a PMH noted below who presents here for *** at the request of Glover Lenis, MD.  PMH: Nonobstructive CAD by Coronary CTA (January 2024) History of PE (treated with apixaban -was related to travel) HTN HLD OSA Chronic asthma History of endometrial cancer Right knee arthroplasty 02/05/2024; left knee arthroplasty 06/04/2024 CKD 3 AA  I last saw her on January 07, 2023 following her right heart catheterization to evaluate for pulmonary pretension in setting of recent PE.  She had noted significant improvement in breathing.  She had lost weight with dietary adjustment and increased activity.  She was starting to go to the gym and doing 80 minutes her stationary bike.  She is getting back started on CPAP.  BP controlled.  Mary Meyers was last seen on 12/09/2023 accompanied by her eldest son.  Doing well without any chest pain or edema with her chronic baseline dyspnea that has been followed by pulmonary medicine.  She had allergic reaction to a protein bar requiring treatment with epinephrine  Benadryl  and famotidine  in the ER.  She was prescribed an EpiPen .  Plan with history of PE was to use PRN apixaban  for travel or prolonged sedentary activity.  She last used apixaban  in January 2025.  She is planning to have knee surgery at [facility] on is cleared from cardiac standpoint.  No changes made.  Cleared for knee surgery.  Plan was 61-month follow-up.  She was seen by Dr. Tamea on June 28, 2024 for follow-up of chronic dyspnea.  She was on CPAP doing well.  Still having some exertional dyspnea.  Recommended pulmonary rehab.   She was then seen again on December 11.  2D echo ordered again to evaluate pulmonary pressures.   Subjective  Discussed the use of AI scribe software for clinical note transcription with the patient, who gave verbal consent to proceed.  History of Present Illness   Cardiovascular ROS: {roscv:310661}  ROS:  Review of Systems - {ros master:310782}    Objective    Studies Reviewed: .        Results  Echocardiogram (05/25/2022): Normal EF 60 to 65%.  No RWMA.  Mild LVH.  G1 DD.  Mildly dilated RV with severely elevated RAP of~15 mmHg.Mary Meyers  Normal aortic and mitral valves. RHC (12/16/2022): Mild pulmonary pretension: Systemic hypertension with elevated PCWP. PAP-mean 30/14-23 mmHg (by new guidelines would be mild pulmonary hypertension); PCWP mean 17 mmHg with TPG of 6 mmHg. Not evaluated Ao sat 97%, PA sat 78%. Cardiac Output, Index: (Fick) 7.57, 3.87; (Thermal) 5.06,2.59; RAP 10 mmHg; RVP-EDP 36/7-11 mmHg.  MONITOR ([facility]): 04/06/2020 1. Study quality adequate for interpretation.  2. The predominant rhythm was sinus bradycardia to sinus tachycardia. 3. The Maximum Heart Rate recorded was 162 bpm, Day 3 / 08:01:51 am, the Minimum Heart Rate recorded was 45 bpm, Day 7 / 05:13:35 am and  the Average Heart Rate was 67 bpm. 4. There were 218 PVCs with a burden of 0.03 %. 5. There were 309 PSVCs with a burden of 0.05 %. There were 14 occurrences of Supraventricular Tachycardia with the longest episode  7 beats, Day 5 / 04:36:31 pm and the  fastest episode 162 bpm, Day 3 / 08:01:56 am. 6. There were 5 Patient triggered events (rapid or fast heartbeat or no symptoms)  of sinus rhythm, sinus tachycardia,  and a short run of SVT.  Coronary CTA (11/15/2022): CAC 73.  Normal coronary arteries with right dominance.  Mild proximal LAD (25%.  Risk Assessment/Calculations:   {Does this patient have ATRIAL FIBRILLATION?:[id number]} No BP recorded.  {Refresh Note OR Click here to enter BP  :1}***          Physical Exam:   VS:  There were no vitals taken for this visit.   Wt Readings from Last 3 Encounters:  09/27/24 199 lb 9.6 oz (90.5 kg)  06/28/24 198 lb 3.2 oz (89.9 kg)  06/04/24 191 lb 12.8 oz (87 kg)    Physical Exam    GEN: Well nourished, well developed in no acute distress; *** NECK: No JVD; No carotid bruits CARDIAC: Normal S1, S2; RRR, no murmurs, rubs, gallops RESPIRATORY:  Clear to auscultation without rales, wheezing or rhonchi ; nonlabored, good air movement. ABDOMEN: Soft, non-tender, non-distended EXTREMITIES:  No edema; No deformity      ASSESSMENT AND PLAN: .   No problem-specific Assessment & Plan notes found for this encounter.   No orders of the defined types were placed in this encounter.   Assessment and Plan Assessment & Plan        {Are you ordering a CV Procedure (e.g. stress test, cath, DCCV, TEE, etc)?   Press F2        :[id number]}   Follow-Up: No follow-ups on file.  I spent *** minutes in the care of Mary Meyers today including {CHL AMB CAR Time Based Billing Options STW (Optional):[phone]::documenting in the encounter.}   Portions of this note were dictated using DRAGON voice recognition software. Please disregard any errors in transcription. This record has been created using Conservation officer, historic buildings. Errors have been sought and corrected, but may not always be located. Such creation errors do not reflect on the standard of medical care.    Signed, Alm MICAEL Clay, MD, MS Alm Clay, M.D., M.S. Interventional Cardiologist  [facility] Pager # [phone]      "

## 2024-11-12 ENCOUNTER — Ambulatory Visit

## 2024-11-14 ENCOUNTER — Ambulatory Visit

## 2024-11-14 DIAGNOSIS — R2689 Other abnormalities of gait and mobility: Secondary | ICD-10-CM

## 2024-11-14 DIAGNOSIS — R262 Difficulty in walking, not elsewhere classified: Secondary | ICD-10-CM

## 2024-11-14 DIAGNOSIS — M6281 Muscle weakness (generalized): Secondary | ICD-10-CM
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
## 2024-11-15 ENCOUNTER — Encounter: Payer: Self-pay | Admitting: Cardiology

## 2024-11-15 ENCOUNTER — Ambulatory Visit: Admitting: Cardiology

## 2024-11-15 VITALS — BP 114/70 | HR 60 | Ht 63.0 in | Wt 199.2 lb

## 2024-11-15 DIAGNOSIS — I272 Pulmonary hypertension, unspecified: Secondary | ICD-10-CM | POA: Diagnosis present

## 2024-11-15 DIAGNOSIS — R0609 Other forms of dyspnea: Secondary | ICD-10-CM | POA: Diagnosis present

## 2024-11-15 DIAGNOSIS — I10 Essential (primary) hypertension: Secondary | ICD-10-CM | POA: Insufficient documentation

## 2024-11-15 DIAGNOSIS — E782 Mixed hyperlipidemia: Secondary | ICD-10-CM | POA: Diagnosis present

## 2024-11-15 DIAGNOSIS — Z86711 Personal history of pulmonary embolism: Secondary | ICD-10-CM | POA: Diagnosis present

## 2024-11-15 NOTE — Progress Notes (Signed)
 "   Cardiology Office Note:  .   Date:  11/15/2024  ID:  Mary Meyers, DOB 07-Jun-1946, MRN [id number] PCP: Mary Lenis, MD  [facility] Providers Cardiologist:  Meyers Clay, MD     Chief Complaint  Patient presents with   Follow-up    OD 6 month f/u c/o sob. Meds reviewed verbally with pt.    Patient Profile: Mary Meyers     Mary Meyers is a very pleasant 79 y.o. female who with a PMH noted below who presents here for follow-up evaluation of exertional dyspnea at the request of Mary Lenis, MD, and Dr. 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  She had noted significant improvement in breathing.  She had lost weight with dietary adjustment and increased activity.  She was starting to go to the gym and doing 80 minutes her stationary bike.  She is getting back started on CPAP.  BP controlled.  Mary Meyers was last seen on 12/09/2023 accompanied by her eldest son.  Doing well without any chest pain or edema with her chronic baseline dyspnea that has been followed by pulmonary medicine.  She had allergic reaction to a protein bar requiring treatment with epinephrine  Benadryl  and famotidine  in the ER.  She was prescribed an EpiPen .  Plan with history of PE was to use PRN apixaban  for travel or prolonged sedentary activity.  She last used apixaban  in January 2025.  She is planning to have knee surgery at [facility] on is cleared from cardiac standpoint.  No changes made.  Cleared for knee surgery.  Plan was 70-month follow-up.  She was seen by Dr. Tamea on June 28, 2024 for follow-up of  chronic dyspnea.  She was on CPAP doing well.  Still having some exertional dyspnea.  Recommended pulmonary rehab.  She was then seen again on December 11.  2D echo ordered again to evaluate pulmonary pressures.     Subjective  Discussed the use of AI scribe software for clinical note transcription with the patient, who gave verbal consent to proceed.  History of Present Illness Mary Meyers is a 79 year old female with pulmonary embolism and sleep apnea who presents with exertional dyspnea and palpitations.  She experiences shortness of breath with minimal exertion, such as walking or performing physical therapy exercises, but not when sitting or lying down. She becomes easily out of breath during these activities.  She describes a sensation of her heart pounding hard, particularly when lying flat on her back, but denies any irregular or fast heartbeats during these episodes. This sensation is not present during physical activity.  No chest tightness or pressure is reported. She reports having had a CT scan and a right heart catheterization in 2019, but does not recall the specific results.  She uses a CPAP machine every night for sleep apnea and is currently undergoing physical therapy, performing exercises while seated for about an hour each day without issues.  Her past medical history includes a pulmonary embolism. She has been exposed to secondhand smoke for many years due to her husband's smoking habits.  During the review of symptoms, she denies any  chest pain, tightness, or pressure, and does not notice her heart racing during exertion.   Cardiovascular ROS: positive for - dyspnea on exertion, palpitations, and palpitations described as pounding sensation when resting. negative for - chest pain, loss of consciousness, orthopnea, palpitations, paroxysmal nocturnal dyspnea, rapid heart rate, or lightheadedness, dizziness or wooziness, syncope or near syncope, TIA or amaurosis  fugax, claudication.    Objective   Active Medications[1]   Studies Reviewed: Mary Meyers   EKG Interpretation Date/Time:  Thursday November 15 2024 15:33:21 EST Ventricular Rate:  60 PR Interval:  138 QRS Duration:  96 QT Interval:  428 QTC Calculation: 428 R Axis:   -7  Text Interpretation: Normal sinus rhythm with sinus arrhythmia Nonspecific ST abnormality When compared with ECG of 09-Dec-2023 14:04, No significant change was found Confirmed by Anner Meyers ([id number]) on 11/15/2024 3:37:47 PM    Previous Cardiac Evaluations Echocardiogram (05/25/2022): Normal EF 60 to 65%.  No RWMA.  Mild LVH.  G1 DD.  Mildly dilated RV with severely elevated RAP of~15 mmHg.Mary Meyers  Normal aortic and mitral valves. RHC (12/16/2022): Mild pulmonary pretension: Systemic hypertension with elevated PCWP. PAP-mean 30/14-23 mmHg (by new guidelines would be mild pulmonary hypertension); PCWP mean 17 mmHg with TPG of 6 mmHg. Not evaluated Ao sat 97%, PA sat 78%. Cardiac Output, Index: (Fick) 7.57, 3.87; (Thermal) 5.06,2.59; RAP 10 mmHg; RVP-EDP 36/7-11 mmHg.  MONITOR ([facility]): 04/06/2020 1. Study quality adequate for interpretation.  2. The predominant rhythm was sinus bradycardia to sinus tachycardia. 3. The Maximum Heart Rate recorded was 162 bpm, Day 3 / 08:01:51 am, the Minimum Heart Rate recorded was 45 bpm, Day 7 / 05:13:35 am and  the Average Heart Rate was 67 bpm. 4. There were 218 PVCs with a burden of 0.03 %. 5. There were 309 PSVCs with a burden of 0.05 %. There were 14 occurrences of Supraventricular Tachycardia with the longest episode 7 beats, Day 5 / 04:36:31 pm and the  fastest episode 162 bpm, Day 3 / 08:01:56 am. 6. There were 5 Patient triggered events (rapid or fast heartbeat or no symptoms)  of sinus rhythm, sinus tachycardia,  and a short run of SVT.  Coronary CTA (11/15/2022): CAC 73.  Normal coronary arteries with right dominance.  Mild proximal LAD (25%).  Risk Assessment/Calculations:                Physical Exam:   VS:  BP 114/70 (BP Location: Left Arm, Patient Position: Sitting, Cuff Size: Large)   Pulse 60   Ht 5' 3 (1.6 m)   Wt 199 lb 4 oz (90.4 kg)   SpO2 98%   BMI 35.30 kg/m    Wt Readings from Last 3 Encounters:  11/15/24 199 lb 4 oz (90.4 kg)  09/27/24 199 lb 9.6 oz (90.5 kg)  06/28/24 198 lb 3.2 oz (89.9 kg)    Physical Exam PULMONARY: Lungs are clear to auscultation bilaterally.   GEN: Well nourished, well developed in no acute distress; moderately obese NECK: No JVD; No carotid bruits CARDIAC: Normal S1, S2; RRR, no murmurs, rubs, gallops RESPIRATORY:  Clear to auscultation without rales, wheezing or rhonchi ; nonlabored, good air movement. ABDOMEN: Soft, non-tender, non-distended EXTREMITIES:  No edema; No deformity      ASSESSMENT AND PLAN: .   Pulmonary hypertension (HCC) Mild resting pulmonary hypertension on That, but now with worsening exertional dyspnea.  Echocardiogram ordered by pulmonary medicine pending-Will expedite Evaluate with CPX (cardiopulmonary/stress test) to differentiate between pulmonary and cardiac etiology of  dyspnea. - Will consider right heart catheterization during exercise if CPX indicates pulmonary hypertension.  Hypertension Blood pressure is little elevated today, but usually pretty well-controlled on Maxide 75-50 mg daily.  Exertional dyspnea Exertional dyspnea and palpitations noted. Previous catheterization showed mildly elevated pressures. Differential includes pulmonary hypertension and microvascular heart disease. CPX and echocardiogram discussed for further evaluation. - Ordered echocardiogram to assess heart function and rule out valve disease. - Scheduled CPX to evaluate heart and lung function during exertion. - Will consider right heart catheterization during exercise if CPX indicates pulmonary hypertension.  History of pulmonary embolism History of PE with no further issues.  No longer on Eliquis .  Plan is to  use Eliquis  PRN with travel.  However history of PE may contribute to current symptoms -> concern for Cteph => potential for chronic emboli affecting pulmonary pressures and heart function discussed. - Will evaluate results of echocardiogram and CPX to assess impact of pulmonary embolism on current symptoms.   Orders Placed This Encounter  Procedures   Cardiopulmonary exercise test   Cardiac Stress Test: Informed Consent Details: Physician/Practitioner Attestation; Transcribe to consent form and obtain patient signature   EKG 12-Lead    No orders of the defined types were placed in this encounter.      Informed Consent   Shared Decision Making/Informed Consent The risks [chest pain, shortness of breath, cardiac arrhythmias, dizziness, blood pressure fluctuations, myocardial infarction, stroke/transient ischemic attack, and life-threatening complications (estimated to be 1 in 10,000)], benefits (risk stratification, diagnosing coronary artery disease, treatment guidance) and alternatives of an cardiopulmonary exercise tolerance test were discussed in detail with Ms. Fessenden and she agrees to proceed.      Follow-Up: Return in about 2 months (around 01/13/2025).  I spent 45 minutes in the care of [facility] today including reviewing studies (reviewed prior Coronary CTA results, right heart cath and echocardiogram results-6 minutes), face to face time discussing treatment options (25 minutes), reviewing records from pulmonary notes and my last notes (5 minutes), 9 minutes dictating and, and documenting in the encounter.   Portions of this note were dictated using DRAGON voice recognition software. Please disregard any errors in transcription. This record has been created using Conservation officer, historic buildings. Errors have been sought and corrected, but may not always be located. Such creation errors do not reflect on the standard of medical care.    Signed, Alm MICAEL Clay, MD,  MS Alm Meyers, M.D., M.S. Interventional Cardiologist  [facility] Pager # [phone]         [1]  Current Meds  Medication Sig   albuterol  (VENTOLIN  HFA) 108 (90 Base) MCG/ACT inhaler Inhale 2 puffs into the lungs every 6 (six) hours as needed.   allopurinol (ZYLOPRIM) 100 MG tablet Take 50 mg by mouth daily.   Apple Cider Vinegar 250 MG CHEW Chew 500 mg by mouth daily.   Ascorbic Acid (VITAMIN C PO) Take 2 capsules by mouth daily.   atorvastatin  (LIPITOR) 20 MG tablet Take 20 mg by mouth daily.   buPROPion  (WELLBUTRIN  XL) 150 MG 24 hr tablet Take 150 mg by mouth daily.   cetirizine (ZYRTEC) 10 MG tablet Take 10 mg by mouth daily.   cholecalciferol (VITAMIN D3) 25 MCG (1000 UNIT) tablet Take 2,000 Units by mouth daily.   EPINEPHrine  (EPIPEN  2-PAK) 0.3 mg/0.3 mL IJ SOAJ injection Inject 0.3 mg into the muscle as needed.   famotidine  (PEPCID ) 20 MG tablet Take 20 mg by mouth daily as needed for  heartburn.   Fluticasone -Umeclidin-Vilant (TRELEGY ELLIPTA ) 200-62.5-25 MCG/ACT AEPB Inhale 1 puff into the lungs daily.   levothyroxine  (SYNTHROID ) 100 MCG tablet Take 100 mcg by mouth daily before breakfast.   meclizine  (ANTIVERT ) 25 MG tablet Take 50 mg by mouth 3 (three) times daily as needed (for vertigo).   Multiple Vitamins-Minerals (HAIR SKIN AND NAILS FORMULA) TABS Take 2 tablets by mouth daily.   Multiple Vitamins-Minerals (IMMUNE SUPPORT PO) Take 2 tablets by mouth daily.   OZEMPIC, 0.25 OR 0.5 MG/DOSE, 2 MG/3ML SOPN Inject 0.25 mg as directed once a week.   pantoprazole  (PROTONIX ) 40 MG tablet Take 40 mg by mouth daily.   potassium chloride  (KLOR-CON ) 10 MEQ tablet Take 10 mEq by mouth daily.   triamterene -hydrochlorothiazide  (MAXZIDE ) 75-50 MG tablet Take 1 tablet by mouth daily.   TURMERIC-GINGER PO Take 2 tablets by mouth daily.   venlafaxine  XR (EFFEXOR -XR) 150 MG 24 hr capsule Take 150 mg by mouth daily.   "

## 2024-11-15 NOTE — Assessment & Plan Note (Addendum)
 History of PE with no further issues.  No longer on Eliquis .  Plan is to use Eliquis  PRN with travel.  However history of PE may contribute to current symptoms -> concern for Cteph => potential for chronic emboli affecting pulmonary pressures and heart function discussed. - Will evaluate results of echocardiogram and CPX to assess impact of pulmonary embolism on current symptoms.

## 2024-11-15 NOTE — Assessment & Plan Note (Signed)
 Exertional dyspnea and palpitations noted. Previous catheterization showed mildly elevated pressures. Differential includes pulmonary hypertension and microvascular heart disease. CPX and echocardiogram discussed for further evaluation. - Ordered echocardiogram to assess heart function and rule out valve disease. - Scheduled CPX to evaluate heart and lung function during exertion. - Will consider right heart catheterization during exercise if CPX indicates pulmonary hypertension.

## 2024-11-15 NOTE — Patient Instructions (Signed)
 Medication Instructions:   Your physician recommends that you continue on your current medications as directed. Please refer to the Current Medication list given to you today.    *If you need a refill on your cardiac medications before your next appointment, please call your pharmacy*  Lab Work:  None ordered at this time   If you have labs (blood work) drawn today and your tests are completely normal, you will receive your results only by:  MyChart Message (if you have MyChart) OR  A paper copy in the mail If you have any lab test that is abnormal or we need to change your treatment, we will call you to review the results.  Testing/Procedures:  You are scheduled for a Cardiopulmonary Exercise (CPX) Test as [facility] on: Date:      Time:   Expect to be in the lab for 2 hours. Please plan to arrive 30 minutes prior to your appointment. You may be asked to reschedule your test if you arrive 20 minutes or more after your scheduled appointment time.  Main Campus address: [address] [place], [place] [address] You may arrive to the Main Entrance A or Entrance C (free valet parking is available at both). -Main Entrance A (on [address]) :proceed to admitting for check in -Entrance C (on [facility]): proceed to [employer] parking or under hospital deck parking using this code _________  Check In: [facility] waiting room (1st floor)   General Instructions for the day of the test (Please follow all instructions from your physician): Refrain from ingesting a heavy meal, alcohol, or caffeine or using tobacco products within 2 hours of the test (DO NOT FAST for mare than 8 hours). You may have all other non-alcoholic, non -caffeinated beverage,a light snack (crackers,a piece of fruit, carrot sticks, toast bagel,etc) up to your appointment. Avoid significant exertion or exercise within 24 hours of your test. Be prepared to exercise and sweat. Your clothing should permit  freedom of movement and include walking or running shoes. Women bring loose fitting short sleeved blouse.  This evaluation may be fatiguing and you may wish ti have someone accompany you to the assessment to drive you home afterward. Bring a list of your medications with you, including dosage and frequency you take the medications (  I.e.,once per day, twice per day, etc). Take all medications as prescribed, unless noted below or instructed to do so by your physician.  Please do not take the following medications prior to your CPX:  _________________________________________________  _________________________________________________  Brief description of the test: A brief lung test will be performed. This will involve you taking deep breaths and blowing hard and fast through your mouth. During these , a clip will be on your nose and you will be breathing through a breathing device.   For the exercise portion of the test you will be walking on a treadmill, or riding a stationary bike, to your maximal effor or until symptoms such as chest pain, shortness of breath, leg pain or dizziness limit your exercise. You will be breathing in and out of a breathing device through your mouth (a clip will be on your nose again). Your heart rate, ECG, blood pressure, oxygen saturations, breathing rate and depth, amount of oxygen you consume and amount of carbon dioxide you produce will be measured and monitored throughout the exercise test.  If you need to cancel or reschedule your appointment please call [phone] If you have further questions  please call your physician or Josette Pesa, MS, ACSM-RCEP at [phone]   Referrals:  None ordered at this time   Follow-Up:  At [facility], you and your health needs are our priority.  As part of our continuing mission to provide you with exceptional heart care, our providers are all part of one team.  This team includes your primary Cardiologist  (physician) and Advanced Practice Providers or APPs (Physician Assistants and Nurse Practitioners) who all work together to provide you with the care you need, when you need it.  Your next appointment:   2 month(s)  Provider:    Alm Clay, MD    We recommend signing up for the patient portal called MyChart.  Sign up information is provided on this After Visit Summary.  MyChart is used to connect with patients for Virtual Visits (Telemedicine).  Patients are able to view lab/test results, encounter notes, upcoming appointments, etc.  Non-urgent messages can be sent to your provider as well.   To learn more about what you can do with MyChart, go to [email or web address].   Other Instructions Schedule ordered ECHO

## 2024-11-15 NOTE — Assessment & Plan Note (Signed)
 Mild resting pulmonary hypertension on That, but now with worsening exertional dyspnea.  Echocardiogram ordered by pulmonary medicine pending-Will expedite Evaluate with CPX (cardiopulmonary/stress test) to differentiate between pulmonary and cardiac etiology of dyspnea. - Will consider right heart catheterization during exercise if CPX indicates pulmonary hypertension.

## 2024-11-15 NOTE — Assessment & Plan Note (Signed)
 Blood pressure is little elevated today, but usually pretty well-controlled on Maxide 75-50 mg daily.

## 2024-11-19 ENCOUNTER — Ambulatory Visit

## 2024-11-21 ENCOUNTER — Ambulatory Visit: Attending: Family Medicine

## 2024-11-21 DIAGNOSIS — M25561 Pain in right knee: Secondary | ICD-10-CM

## 2024-11-21 DIAGNOSIS — R262 Difficulty in walking, not elsewhere classified: Secondary | ICD-10-CM

## 2024-11-21 DIAGNOSIS — R2689 Other abnormalities of gait and mobility: Secondary | ICD-10-CM

## 2024-11-21 DIAGNOSIS — M25662 Stiffness of left knee, not elsewhere classified: Secondary | ICD-10-CM

## 2024-11-21 DIAGNOSIS — M25661 Stiffness of right knee, not elsewhere classified: Secondary | ICD-10-CM

## 2024-11-21 DIAGNOSIS — M6281 Muscle weakness (generalized): Secondary | ICD-10-CM
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
## 2024-11-22 ENCOUNTER — Telehealth: Payer: Self-pay

## 2024-11-22 NOTE — Telephone Encounter (Signed)
 Received live call from Ms. Ashmore with report of a little blood on my panty. She is requesting an appointment to see Lauren. She does not feel comfortable waiting for her gyn onc appointment on 12/05/24. Appointment arranged for Lauren in [facility], 11/23/24

## 2024-11-23 ENCOUNTER — Inpatient Hospital Stay: Admitting: Nurse Practitioner

## 2024-11-23 ENCOUNTER — Encounter: Payer: Self-pay | Admitting: Nurse Practitioner

## 2024-11-23 VITALS — BP 135/78 | HR 66 | Temp 98.2°F | Resp 16 | Wt 198.0 lb

## 2024-11-23 DIAGNOSIS — N939 Abnormal uterine and vaginal bleeding, unspecified: Secondary | ICD-10-CM
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
## 2024-11-26 ENCOUNTER — Ambulatory Visit

## 2024-11-27 ENCOUNTER — Encounter (HOSPITAL_COMMUNITY)

## 2024-11-28 ENCOUNTER — Ambulatory Visit

## 2024-12-03 ENCOUNTER — Ambulatory Visit

## 2024-12-05 ENCOUNTER — Ambulatory Visit

## 2024-12-05 ENCOUNTER — Ambulatory Visit: Payer: Medicare Other

## 2024-12-10 ENCOUNTER — Ambulatory Visit

## 2024-12-12 ENCOUNTER — Ambulatory Visit

## 2024-12-17 ENCOUNTER — Ambulatory Visit

## 2024-12-19 ENCOUNTER — Ambulatory Visit

## 2024-12-24 ENCOUNTER — Ambulatory Visit

## 2024-12-26 ENCOUNTER — Ambulatory Visit

## 2025-01-24 ENCOUNTER — Ambulatory Visit: Admitting: Cardiology

## 2025-01-29 ENCOUNTER — Ambulatory Visit: Admitting: Pulmonary Disease
